# Patient Record
Sex: Female | Born: 1960 | Race: White | Hispanic: No | Marital: Married | Smoking: Never smoker
Health system: Southern US, Community
[De-identification: ages and names within clinical notes are randomized; demographics above are authoritative.]

## PROBLEM LIST (undated history)

## (undated) DIAGNOSIS — K635 Polyp of colon: Secondary | ICD-10-CM

## (undated) DIAGNOSIS — E119 Type 2 diabetes mellitus without complications: Secondary | ICD-10-CM

## (undated) DIAGNOSIS — M1990 Unspecified osteoarthritis, unspecified site: Secondary | ICD-10-CM

## (undated) DIAGNOSIS — IMO0001 Reserved for inherently not codable concepts without codable children: Secondary | ICD-10-CM

## (undated) DIAGNOSIS — R51 Headache: Secondary | ICD-10-CM

## (undated) DIAGNOSIS — C50919 Malignant neoplasm of unspecified site of unspecified female breast: Secondary | ICD-10-CM

## (undated) DIAGNOSIS — C801 Malignant (primary) neoplasm, unspecified: Secondary | ICD-10-CM

## (undated) DIAGNOSIS — K219 Gastro-esophageal reflux disease without esophagitis: Secondary | ICD-10-CM

## (undated) DIAGNOSIS — R519 Headache, unspecified: Secondary | ICD-10-CM

## (undated) HISTORY — PX: FOOT SURGERY: SHX648

## (undated) HISTORY — DX: Malignant neoplasm of unspecified site of unspecified female breast: C50.919

## (undated) HISTORY — DX: Type 2 diabetes mellitus without complications: E11.9

## (undated) HISTORY — DX: Malignant (primary) neoplasm, unspecified: C80.1

## (undated) HISTORY — PX: COLONOSCOPY: SHX174

## (undated) HISTORY — DX: Polyp of colon: K63.5

## (undated) HISTORY — DX: Gastro-esophageal reflux disease without esophagitis: K21.9

## (undated) HISTORY — DX: Unspecified osteoarthritis, unspecified site: M19.90

## (undated) HISTORY — PX: BONE MARROW TRANSPLANT: SHX200

---

## 1993-10-10 HISTORY — PX: CHOLECYSTECTOMY: SHX55

## 1995-10-11 DIAGNOSIS — C50919 Malignant neoplasm of unspecified site of unspecified female breast: Secondary | ICD-10-CM

## 1995-10-11 HISTORY — DX: Malignant neoplasm of unspecified site of unspecified female breast: C50.919

## 2000-01-09 DIAGNOSIS — K621 Rectal polyp: Secondary | ICD-10-CM | POA: Insufficient documentation

## 2000-10-10 DIAGNOSIS — C801 Malignant (primary) neoplasm, unspecified: Secondary | ICD-10-CM

## 2000-10-10 HISTORY — PX: MASTECTOMY: SHX3

## 2000-10-10 HISTORY — PX: OTHER SURGICAL HISTORY: SHX169

## 2000-10-10 HISTORY — DX: Malignant (primary) neoplasm, unspecified: C80.1

## 2004-07-10 ENCOUNTER — Ambulatory Visit: Payer: Self-pay | Admitting: Oncology

## 2004-10-15 ENCOUNTER — Ambulatory Visit: Payer: Self-pay | Admitting: General Surgery

## 2004-11-02 ENCOUNTER — Ambulatory Visit: Payer: Self-pay | Admitting: Oncology

## 2004-11-10 ENCOUNTER — Ambulatory Visit: Payer: Self-pay | Admitting: Oncology

## 2004-11-30 ENCOUNTER — Ambulatory Visit: Payer: Self-pay | Admitting: General Surgery

## 2005-03-02 ENCOUNTER — Ambulatory Visit: Payer: Self-pay | Admitting: Oncology

## 2005-03-10 ENCOUNTER — Ambulatory Visit: Payer: Self-pay | Admitting: Oncology

## 2005-06-30 ENCOUNTER — Ambulatory Visit: Payer: Self-pay | Admitting: Oncology

## 2005-07-08 ENCOUNTER — Ambulatory Visit: Payer: Self-pay | Admitting: Oncology

## 2005-07-10 ENCOUNTER — Ambulatory Visit: Payer: Self-pay | Admitting: Oncology

## 2005-08-10 ENCOUNTER — Ambulatory Visit: Payer: Self-pay | Admitting: Oncology

## 2005-11-10 ENCOUNTER — Ambulatory Visit: Payer: Self-pay | Admitting: Oncology

## 2005-12-08 ENCOUNTER — Ambulatory Visit: Payer: Self-pay | Admitting: Oncology

## 2005-12-14 ENCOUNTER — Ambulatory Visit: Payer: Self-pay | Admitting: General Surgery

## 2006-03-02 ENCOUNTER — Ambulatory Visit: Payer: Self-pay | Admitting: Oncology

## 2006-03-10 ENCOUNTER — Ambulatory Visit: Payer: Self-pay | Admitting: Oncology

## 2006-05-31 ENCOUNTER — Ambulatory Visit: Payer: Self-pay | Admitting: Oncology

## 2006-06-02 ENCOUNTER — Ambulatory Visit: Payer: Self-pay | Admitting: Oncology

## 2006-06-10 ENCOUNTER — Ambulatory Visit: Payer: Self-pay | Admitting: Oncology

## 2006-09-08 ENCOUNTER — Ambulatory Visit: Payer: Self-pay | Admitting: Oncology

## 2006-12-11 ENCOUNTER — Ambulatory Visit: Payer: Self-pay | Admitting: General Surgery

## 2007-01-08 ENCOUNTER — Ambulatory Visit: Payer: Self-pay | Admitting: Oncology

## 2007-01-09 ENCOUNTER — Ambulatory Visit: Payer: Self-pay | Admitting: Oncology

## 2007-04-02 ENCOUNTER — Ambulatory Visit: Payer: Self-pay | Admitting: Oncology

## 2007-04-10 ENCOUNTER — Ambulatory Visit: Payer: Self-pay | Admitting: Oncology

## 2007-06-11 ENCOUNTER — Ambulatory Visit: Payer: Self-pay | Admitting: Oncology

## 2007-07-09 ENCOUNTER — Ambulatory Visit: Payer: Self-pay | Admitting: Oncology

## 2007-07-11 ENCOUNTER — Ambulatory Visit: Payer: Self-pay | Admitting: Oncology

## 2007-09-10 ENCOUNTER — Ambulatory Visit: Payer: Self-pay | Admitting: Oncology

## 2007-10-08 ENCOUNTER — Ambulatory Visit: Payer: Self-pay | Admitting: Oncology

## 2007-10-11 ENCOUNTER — Ambulatory Visit: Payer: Self-pay | Admitting: Oncology

## 2007-11-11 ENCOUNTER — Ambulatory Visit: Payer: Self-pay | Admitting: Oncology

## 2007-12-09 ENCOUNTER — Ambulatory Visit: Payer: Self-pay | Admitting: Oncology

## 2007-12-14 ENCOUNTER — Ambulatory Visit: Payer: Self-pay | Admitting: General Surgery

## 2008-01-09 ENCOUNTER — Ambulatory Visit: Payer: Self-pay | Admitting: Oncology

## 2008-01-11 ENCOUNTER — Ambulatory Visit: Payer: Self-pay | Admitting: Oncology

## 2008-02-08 ENCOUNTER — Ambulatory Visit: Payer: Self-pay | Admitting: Oncology

## 2008-03-10 ENCOUNTER — Ambulatory Visit: Payer: Self-pay | Admitting: Oncology

## 2008-05-10 ENCOUNTER — Ambulatory Visit: Payer: Self-pay | Admitting: Oncology

## 2008-06-04 ENCOUNTER — Ambulatory Visit: Payer: Self-pay | Admitting: Oncology

## 2008-06-10 ENCOUNTER — Ambulatory Visit: Payer: Self-pay | Admitting: Oncology

## 2008-07-03 ENCOUNTER — Ambulatory Visit: Payer: Self-pay | Admitting: Family Medicine

## 2008-07-29 ENCOUNTER — Ambulatory Visit: Payer: Self-pay | Admitting: Oncology

## 2008-08-10 ENCOUNTER — Ambulatory Visit: Payer: Self-pay | Admitting: Oncology

## 2008-12-16 ENCOUNTER — Ambulatory Visit: Payer: Self-pay | Admitting: Oncology

## 2009-01-08 ENCOUNTER — Ambulatory Visit: Payer: Self-pay | Admitting: Oncology

## 2009-01-12 ENCOUNTER — Ambulatory Visit: Payer: Self-pay | Admitting: Oncology

## 2009-02-07 ENCOUNTER — Ambulatory Visit: Payer: Self-pay | Admitting: Oncology

## 2009-03-13 ENCOUNTER — Emergency Department: Payer: Self-pay | Admitting: Emergency Medicine

## 2009-03-27 ENCOUNTER — Ambulatory Visit: Payer: Self-pay | Admitting: Family Medicine

## 2009-05-11 ENCOUNTER — Ambulatory Visit: Payer: Self-pay

## 2009-08-10 ENCOUNTER — Ambulatory Visit: Payer: Self-pay | Admitting: Oncology

## 2009-08-31 ENCOUNTER — Ambulatory Visit: Payer: Self-pay | Admitting: Oncology

## 2009-09-09 ENCOUNTER — Ambulatory Visit: Payer: Self-pay | Admitting: Oncology

## 2009-12-17 ENCOUNTER — Ambulatory Visit: Payer: Self-pay | Admitting: Oncology

## 2010-01-08 ENCOUNTER — Ambulatory Visit: Payer: Self-pay | Admitting: General Surgery

## 2010-02-07 ENCOUNTER — Ambulatory Visit: Payer: Self-pay | Admitting: Oncology

## 2010-03-01 ENCOUNTER — Ambulatory Visit: Payer: Self-pay | Admitting: Oncology

## 2010-03-10 ENCOUNTER — Ambulatory Visit: Payer: Self-pay | Admitting: Oncology

## 2010-08-19 ENCOUNTER — Emergency Department: Payer: Self-pay | Admitting: Emergency Medicine

## 2010-08-31 ENCOUNTER — Ambulatory Visit: Payer: Self-pay | Admitting: Oncology

## 2010-09-01 LAB — CANCER ANTIGEN 27.29: CA 27.29: 27.1 U/mL (ref 0.0–38.6)

## 2010-09-09 ENCOUNTER — Ambulatory Visit: Payer: Self-pay | Admitting: Oncology

## 2010-10-10 ENCOUNTER — Ambulatory Visit: Payer: Self-pay | Admitting: Oncology

## 2010-11-10 ENCOUNTER — Ambulatory Visit: Payer: Self-pay | Admitting: Oncology

## 2010-12-21 ENCOUNTER — Ambulatory Visit: Payer: Self-pay | Admitting: Oncology

## 2010-12-23 ENCOUNTER — Ambulatory Visit: Payer: Self-pay | Admitting: Oncology

## 2010-12-24 LAB — CANCER ANTIGEN 27.29: CA 27.29: 23.5 U/mL (ref 0.0–38.6)

## 2011-01-09 ENCOUNTER — Ambulatory Visit: Payer: Self-pay | Admitting: Oncology

## 2011-10-11 HISTORY — PX: SIGMOIDOSCOPY: SUR1295

## 2011-12-13 ENCOUNTER — Ambulatory Visit: Payer: Self-pay | Admitting: Oncology

## 2011-12-13 LAB — CBC CANCER CENTER
Basophil %: 1.2 %
Eosinophil #: 0.2 x10 3/mm (ref 0.0–0.7)
Eosinophil %: 4.1 %
HCT: 40.7 % (ref 35.0–47.0)
HGB: 13.8 g/dL (ref 12.0–16.0)
Lymphocyte #: 1.6 x10 3/mm (ref 1.0–3.6)
Lymphocyte %: 27.2 %
MCH: 27.6 pg (ref 26.0–34.0)
MCHC: 34 g/dL (ref 32.0–36.0)
MCV: 81 fL (ref 80–100)
Monocyte #: 0.3 x10 3/mm (ref 0.0–0.7)
Neutrophil %: 61.7 %
RBC: 5 10*6/uL (ref 3.80–5.20)

## 2011-12-13 LAB — COMPREHENSIVE METABOLIC PANEL
Albumin: 3.9 g/dL (ref 3.4–5.0)
Alkaline Phosphatase: 94 U/L (ref 50–136)
Anion Gap: 13 (ref 7–16)
BUN: 14 mg/dL (ref 7–18)
Bilirubin,Total: 0.4 mg/dL (ref 0.2–1.0)
Chloride: 103 mmol/L (ref 98–107)
Creatinine: 1.07 mg/dL (ref 0.60–1.30)
EGFR (African American): >60
Glucose: 154 mg/dL — ABNORMAL HIGH (ref 65–99)
SGOT(AST): 12 U/L — ABNORMAL LOW (ref 15–37)
SGPT (ALT): 24 U/L
Total Protein: 7.5 g/dL (ref 6.4–8.2)

## 2011-12-14 LAB — CANCER ANTIGEN 27.29: CA 27.29: 28.3 U/mL (ref 0.0–38.6)

## 2012-01-09 ENCOUNTER — Ambulatory Visit: Payer: Self-pay | Admitting: Oncology

## 2012-02-08 ENCOUNTER — Ambulatory Visit: Payer: Self-pay | Admitting: Oncology

## 2012-07-18 ENCOUNTER — Ambulatory Visit: Payer: Self-pay | Admitting: Oncology

## 2012-07-18 LAB — CBC CANCER CENTER
Basophil #: 0.1 x10 3/mm (ref 0.0–0.1)
Eosinophil #: 0.2 x10 3/mm (ref 0.0–0.7)
Eosinophil %: 3.2 %
HGB: 13.5 g/dL (ref 12.0–16.0)
Lymphocyte #: 1.5 x10 3/mm (ref 1.0–3.6)
MCH: 27.4 pg (ref 26.0–34.0)
MCHC: 33.7 g/dL (ref 32.0–36.0)
MCV: 82 fL (ref 80–100)
Monocyte #: 0.4 x10 3/mm (ref 0.2–0.9)
Neutrophil #: 4.6 x10 3/mm (ref 1.4–6.5)
Platelet: 191 x10 3/mm (ref 150–440)
RBC: 4.94 10*6/uL (ref 3.80–5.20)
RDW: 14.6 % — ABNORMAL HIGH (ref 11.5–14.5)
WBC: 6.8 x10 3/mm (ref 3.6–11.0)

## 2012-07-18 LAB — COMPREHENSIVE METABOLIC PANEL
Albumin: 3.7 g/dL (ref 3.4–5.0)
Alkaline Phosphatase: 97 U/L (ref 50–136)
BUN: 15 mg/dL (ref 7–18)
Bilirubin,Total: 0.3 mg/dL (ref 0.2–1.0)
Co2: 28 mmol/L (ref 21–32)
Creatinine: 0.83 mg/dL (ref 0.60–1.30)
EGFR (African American): >60
EGFR (Non-African Amer.): >60
Glucose: 135 mg/dL — ABNORMAL HIGH (ref 65–99)
SGPT (ALT): 19 U/L (ref 12–78)
Sodium: 144 mmol/L (ref 136–145)
Total Protein: 6.9 g/dL (ref 6.4–8.2)

## 2012-08-10 ENCOUNTER — Ambulatory Visit: Payer: Self-pay | Admitting: Oncology

## 2012-12-12 ENCOUNTER — Encounter: Payer: Self-pay | Admitting: *Deleted

## 2012-12-31 ENCOUNTER — Ambulatory Visit: Payer: Self-pay | Admitting: Oncology

## 2013-01-15 ENCOUNTER — Ambulatory Visit: Payer: Self-pay | Admitting: Oncology

## 2013-01-16 LAB — CBC CANCER CENTER
Basophil #: 0.1 x10 3/mm (ref 0.0–0.1)
Basophil %: 1.3 %
Eosinophil #: 0.2 x10 3/mm (ref 0.0–0.7)
HCT: 41.3 % (ref 35.0–47.0)
HGB: 13.7 g/dL (ref 12.0–16.0)
Lymphocyte #: 1.5 x10 3/mm (ref 1.0–3.6)
Lymphocyte %: 23.9 %
MCV: 81 fL (ref 80–100)
Monocyte #: 0.4 x10 3/mm (ref 0.2–0.9)
Monocyte %: 6.1 %
Neutrophil #: 4.2 x10 3/mm (ref 1.4–6.5)
RBC: 5.09 10*6/uL (ref 3.80–5.20)
RDW: 14.6 % — ABNORMAL HIGH (ref 11.5–14.5)

## 2013-01-16 LAB — COMPREHENSIVE METABOLIC PANEL
Albumin: 3.9 g/dL (ref 3.4–5.0)
BUN: 12 mg/dL (ref 7–18)
Calcium, Total: 9.2 mg/dL (ref 8.5–10.1)
Chloride: 102 mmol/L (ref 98–107)
Co2: 31 mmol/L (ref 21–32)
Creatinine: 1.14 mg/dL (ref 0.60–1.30)
EGFR (African American): >60
EGFR (Non-African Amer.): 55 — ABNORMAL LOW
Osmolality: 288 (ref 275–301)
SGOT(AST): 12 U/L — ABNORMAL LOW (ref 15–37)
SGPT (ALT): 23 U/L (ref 12–78)
Total Protein: 7.4 g/dL (ref 6.4–8.2)

## 2013-01-17 LAB — CANCER ANTIGEN 27.29: CA 27.29: 39.4 U/mL — ABNORMAL HIGH (ref 0.0–38.6)

## 2013-01-30 ENCOUNTER — Ambulatory Visit: Payer: Self-pay | Admitting: General Surgery

## 2013-02-07 ENCOUNTER — Ambulatory Visit: Payer: Self-pay | Admitting: Oncology

## 2013-03-05 ENCOUNTER — Ambulatory Visit: Payer: Self-pay | Admitting: General Surgery

## 2013-03-29 ENCOUNTER — Other Ambulatory Visit: Payer: Self-pay | Admitting: General Surgery

## 2013-04-08 ENCOUNTER — Ambulatory Visit: Payer: Self-pay | Admitting: General Surgery

## 2013-04-24 ENCOUNTER — Encounter: Payer: Self-pay | Admitting: General Surgery

## 2013-04-24 ENCOUNTER — Ambulatory Visit (INDEPENDENT_AMBULATORY_CARE_PROVIDER_SITE_OTHER): Payer: Managed Care, Other (non HMO) | Admitting: General Surgery

## 2013-04-24 VITALS — BP 140/82 | HR 68 | Resp 14 | Ht 65.0 in | Wt 240.0 lb

## 2013-04-24 DIAGNOSIS — K621 Rectal polyp: Secondary | ICD-10-CM

## 2013-04-24 NOTE — Patient Instructions (Addendum)
Colonoscopy A colonoscopy is an exam to evaluate your entire colon. In this exam, your colon is cleansed. A long fiberoptic tube is inserted through your rectum and into your colon. The fiberoptic scope (endoscope) is a long bundle of enclosed and very flexible fibers. These fibers transmit light to the area examined and send images from that area to your caregiver. Discomfort is usually minimal. You may be given a drug to help you sleep (sedative) during or prior to the procedure. This exam helps to detect lumps (tumors), polyps, inflammation, and areas of bleeding. Your caregiver may also take a small piece of tissue (biopsy) that will be examined under a microscope. LET YOUR CAREGIVER KNOW ABOUT:   Allergies to food or medicine.  Medicines taken, including vitamins, herbs, eyedrops, over-the-counter medicines, and creams.  Use of steroids (by mouth or creams).  Previous problems with anesthetics or numbing medicines.  History of bleeding problems or blood clots.  Previous surgery.  Other health problems, including diabetes and kidney problems.  Possibility of pregnancy, if this applies. BEFORE THE PROCEDURE   A clear liquid diet may be required for 2 days before the exam.  Ask your caregiver about changing or stopping your regular medications.  Liquid injections (enemas) or laxatives may be required.  A large amount of electrolyte solution may be given to you to drink over a short period of time. This solution is used to clean out your colon.  You should be present 60 minutes prior to your procedure or as directed by your caregiver. AFTER THE PROCEDURE   If you received a sedative or pain relieving medication, you will need to arrange for someone to drive you home.  Occasionally, there is a little blood passed with the first bowel movement. Do not be concerned. FINDING OUT THE RESULTS OF YOUR TEST Not all test results are available during your visit. If your test results are  not back during the visit, make an appointment with your caregiver to find out the results. Do not assume everything is normal if you have not heard from your caregiver or the medical facility. It is important for you to follow up on all of your test results. HOME CARE INSTRUCTIONS   It is not unusual to pass moderate amounts of gas and experience mild abdominal cramping following the procedure. This is due to air being used to inflate your colon during the exam. Walking or a warm pack on your belly (abdomen) may help.  You may resume all normal meals and activities after sedatives and medicines have worn off.  Only take over-the-counter or prescription medicines for pain, discomfort, or fever as directed by your caregiver. Do not use aspirin or blood thinners if a biopsy was taken. Consult your caregiver for medicine usage if biopsies were taken. SEEK IMMEDIATE MEDICAL CARE IF:   You have a fever.  You pass large blood clots or fill a toilet with blood following the procedure. This may also occur 10 to 14 days following the procedure. This is more likely if a biopsy was taken.  You develop abdominal pain that keeps getting worse and cannot be relieved with medicine. Document Released: 09/23/2000 Document Revised: 12/19/2011 Document Reviewed: 05/08/2008 [facility] Patient Information 2014 [place], [place].  Patient will be contacted to schedule colonoscopy once September 2014 schedule is available. This patient has been asked to hold metformin day of colonoscopy prep and procedure.

## 2013-04-24 NOTE — Progress Notes (Signed)
Patient ID: Paula Price, female   DOB: 11/22/1960, 52 y.o.   MRN: [id number]  Chief Complaint  Patient presents with  . Follow-up    colonoscopy    HPI Paula Price is a 52 y.o. female here today to discuss having a colonoscopy .Patient last colonoscopy was in April 2011. Patient states she has no new medical problems. She does report some stomach discomfort that is unchanged since her last colonoscopy.  HPI  Past Medical History  Diagnosis Date  . Arthritis   . Diabetes mellitus without complication   . Colon polyps   . GERD (gastroesophageal reflux disease)   . Cancer 2002     LEFT MASTECTOMY  . Breast cancer, female 59    Past Surgical History  Procedure Laterality Date  . Mastectomy Left 2002  . Colonoscopy  2011    DR.Ardelle Haliburton  . Foot surgery Right   . Gastric endoscopy   2002  . Bone marrow transplant    . Cholecystectomy  1995  . Sigmoidoscopy  2013    Family History  Problem Relation Age of Onset  . Colon cancer Sister   . COPD Mother     Social History History  Substance Use Topics  . Smoking status: Never Smoker   . Smokeless tobacco: Never Used  . Alcohol Use: No    Allergies  Allergen Reactions  . Codeine Hives  . Latex Itching  . Shellfish Allergy     Current Outpatient Prescriptions  Medication Sig Dispense Refill  . Ascorbic Acid (VITAMIN C) 1000 MG tablet Take 1,000 mg by mouth daily.      . calcium carbonate (OS-CAL - DOSED IN MG OF ELEMENTAL CALCIUM) 1250 MG tablet Take 1 tablet by mouth daily.      Marland Kitchen esomeprazole (NEXIUM) 40 MG packet Take 40 mg by mouth daily before breakfast.      . fexofenadine (ALLEGRA) 180 MG tablet Take 180 mg by mouth daily.      Marland Kitchen letrozole (FEMARA) 2.5 MG tablet Take 2.5 mg by mouth daily.      . meloxicam (MOBIC) 15 MG tablet Take 15 mg by mouth daily.      . metFORMIN (GLUCOPHAGE) 500 MG tablet Take 500 mg by mouth 2 (two) times daily with a meal.      . PARoxetine (PAXIL) 40 MG tablet Take 40  mg by mouth every morning.      . traMADol (ULTRAM) 50 MG tablet Take 50 mg by mouth every 6 (six) hours as needed for pain.      . vitamin E 400 UNIT capsule Take 400 Units by mouth daily.       No current facility-administered medications for this visit.    Review of Systems Review of Systems  Constitutional: Negative.   Respiratory: Negative.   Cardiovascular: Negative.     Blood pressure 140/82, pulse 68, resp. rate 14, height 5\' 5"  (1.651 m), weight 240 lb (108.863 kg).  Physical Exam Physical Exam  Constitutional: She is oriented to person, place, and time. She appears well-developed and well-nourished.  Neck: Neck supple.  Cardiovascular: Normal rate, regular rhythm and normal heart sounds.   Pulmonary/Chest: Effort normal and breath sounds normal.  Abdominal: Soft.  Neurological: She is alert and oriented to person, place, and time.    Data Reviewed 2011 colonoscopy showed a tubular adenoma with focal high-grade dysplasia.  Assessment    Rectal polyp.    Plan    Followup colonoscopy is recommended to  confirm no additional lesions are identified considering the findings of high-grade dysplasia.    Patient will be contacted to schedule colonoscopy once September 2014 schedule is available. This patient has been asked to hold metformin day of colonoscopy prep and procedure.   Earline Mayotte 04/24/2013, 9:42 PM

## 2013-05-13 ENCOUNTER — Telehealth: Payer: Self-pay | Admitting: *Deleted

## 2013-05-13 NOTE — Telephone Encounter (Signed)
Message let for patient to call the office.   We need to arrange a date for colonoscopy at [facility]. This patient will need to hold metformin day of prep and procedure.

## 2013-06-06 ENCOUNTER — Telehealth: Payer: Self-pay | Admitting: *Deleted

## 2013-06-06 DIAGNOSIS — K620 Anal polyp: Secondary | ICD-10-CM

## 2013-06-06 DIAGNOSIS — Z1211 Encounter for screening for malignant neoplasm of colon: Secondary | ICD-10-CM

## 2013-06-06 MED ORDER — POLYETHYLENE GLYCOL 3350 17 GM/SCOOP PO POWD: ORAL | Status: AC

## 2013-06-06 NOTE — Telephone Encounter (Signed)
Patient was called again today since she had not called called back from early August. This patient has been scheduled for a colonoscopy on 07-24-13 at [facility]. Patient has been asked to hold metformin day of colonoscopy prep and procedure. Miralax prescription has been sent to her pharmacy.

## 2013-07-17 ENCOUNTER — Ambulatory Visit: Payer: Self-pay | Admitting: Oncology

## 2013-07-17 ENCOUNTER — Telehealth: Payer: Self-pay | Admitting: *Deleted

## 2013-07-17 LAB — CBC CANCER CENTER
Basophil #: 0.1 x10 3/mm (ref 0.0–0.1)
Basophil %: 1 %
Eosinophil #: 0.2 x10 3/mm (ref 0.0–0.7)
HGB: 13.3 g/dL (ref 12.0–16.0)
MCH: 27.3 pg (ref 26.0–34.0)
MCHC: 33 g/dL (ref 32.0–36.0)
MCV: 83 fL (ref 80–100)
Monocyte #: 0.4 x10 3/mm (ref 0.2–0.9)
Monocyte %: 5.6 %
Neutrophil #: 5.2 x10 3/mm (ref 1.4–6.5)
Neutrophil %: 70.1 %

## 2013-07-17 LAB — COMPREHENSIVE METABOLIC PANEL
Albumin: 3.3 g/dL — ABNORMAL LOW (ref 3.4–5.0)
BUN: 14 mg/dL (ref 7–18)
Bilirubin,Total: 0.3 mg/dL (ref 0.2–1.0)
Co2: 29 mmol/L (ref 21–32)
Creatinine: 1.12 mg/dL (ref 0.60–1.30)
EGFR (African American): >60
EGFR (Non-African Amer.): 56 — ABNORMAL LOW
Glucose: 297 mg/dL — ABNORMAL HIGH (ref 65–99)
Osmolality: 287 (ref 275–301)
Potassium: 4.3 mmol/L (ref 3.5–5.1)
SGOT(AST): 42 U/L — ABNORMAL HIGH (ref 15–37)
SGPT (ALT): 37 U/L (ref 12–78)
Sodium: 138 mmol/L (ref 136–145)
Total Protein: 6.7 g/dL (ref 6.4–8.2)

## 2013-07-17 NOTE — Telephone Encounter (Signed)
Patient was at the office today and reports no change in medications since last office visit. She will pre-register this Friday at [facility]. We will proceed with colonoscopy that is scheduled for 07-24-13. She will call the office if she has further questions.

## 2013-07-18 LAB — CANCER ANTIGEN 27.29: CA 27.29: 366.4 U/mL — ABNORMAL HIGH (ref 0.0–38.6)

## 2013-07-20 ENCOUNTER — Other Ambulatory Visit: Payer: Self-pay | Admitting: General Surgery

## 2013-07-20 DIAGNOSIS — Z1211 Encounter for screening for malignant neoplasm of colon: Secondary | ICD-10-CM

## 2013-07-24 ENCOUNTER — Ambulatory Visit: Payer: Self-pay | Admitting: General Surgery

## 2013-07-24 DIAGNOSIS — D129 Benign neoplasm of anus and anal canal: Secondary | ICD-10-CM

## 2013-07-24 DIAGNOSIS — D128 Benign neoplasm of rectum: Secondary | ICD-10-CM

## 2013-07-25 ENCOUNTER — Encounter: Payer: Self-pay | Admitting: General Surgery

## 2013-07-25 LAB — PATHOLOGY REPORT

## 2013-07-29 ENCOUNTER — Telehealth: Payer: Self-pay | Admitting: General Surgery

## 2013-07-29 NOTE — Telephone Encounter (Signed)
Notified colon biopsies were benign.  Reported prior to endo long standing abdominal pain since prior colonoscopy. No exacerbation after recent exam.  Will arrange office visit to review.

## 2013-07-30 ENCOUNTER — Telehealth: Payer: Self-pay

## 2013-07-30 NOTE — Telephone Encounter (Signed)
Message copied by Sinda Du on Tue Jul 30, 2013  9:27 AM ------      Message from: [place], Merrily Pew      Created: Mon Jul 29, 2013  8:07 AM       Arrange f/u appt in next few weeks to discuss abdominal pain. Thanks. ------

## 2013-07-30 NOTE — Telephone Encounter (Signed)
Left message for patient to call back to set up follow up appointment with J[facility].

## 2013-08-10 ENCOUNTER — Ambulatory Visit: Payer: Self-pay | Admitting: Oncology

## 2013-08-12 ENCOUNTER — Encounter: Payer: Self-pay | Admitting: General Surgery

## 2013-08-12 ENCOUNTER — Ambulatory Visit (INDEPENDENT_AMBULATORY_CARE_PROVIDER_SITE_OTHER): Payer: Managed Care, Other (non HMO) | Admitting: General Surgery

## 2013-08-12 VITALS — BP 130/70 | HR 76 | Resp 12 | Ht 65.0 in | Wt 242.0 lb

## 2013-08-12 DIAGNOSIS — R5383 Other fatigue: Secondary | ICD-10-CM

## 2013-08-12 DIAGNOSIS — R160 Hepatomegaly, not elsewhere classified: Secondary | ICD-10-CM | POA: Insufficient documentation

## 2013-08-12 DIAGNOSIS — R109 Unspecified abdominal pain: Secondary | ICD-10-CM | POA: Insufficient documentation

## 2013-08-12 DIAGNOSIS — R5381 Other malaise: Secondary | ICD-10-CM

## 2013-08-12 DIAGNOSIS — D128 Benign neoplasm of rectum: Secondary | ICD-10-CM | POA: Insufficient documentation

## 2013-08-12 NOTE — Progress Notes (Signed)
Patient ID: Paula Price, female   DOB: Mar 23, 1961, 52 y.o.   MRN: [id number]  Chief Complaint  Patient presents with  . Follow-up    abdominal pain     HPI Paula Price is a 52 y.o. female here today for abdominal pain. She state she has been having pain for about three months. Colonoscopy  was done on 07/24/13. Patient states she has no  Appetite and she has been feeling tried more. Patient states it hurts on both sides. At the time of her office visit prior to her colonoscopy she reported that she been having some abdominal symptoms since her prior exam in 2011. She was pinned down today reporting that in the last 3 month she's had more upper abdominal discomfort. This has not been associated with weight loss. No clearcut dietary intolerance. No change in bowel function.  HPI  Past Medical History  Diagnosis Date  . Arthritis   . Diabetes mellitus without complication   . Colon polyps   . GERD (gastroesophageal reflux disease)   . Cancer 2002     LEFT MASTECTOMY  . Breast cancer, female 66    Past Surgical History  Procedure Laterality Date  . Mastectomy Left 2002  . Colonoscopy  4098,[id number]    DR.Daliyah Sramek  . Foot surgery Right   . Gastric endoscopy   2002  . Bone marrow transplant    . Cholecystectomy  1995  . Sigmoidoscopy  2013    Family History  Problem Relation Age of Onset  . Colon cancer Sister   . COPD Mother     Social History History  Substance Use Topics  . Smoking status: Never Smoker   . Smokeless tobacco: Never Used  . Alcohol Use: No    Allergies  Allergen Reactions  . Codeine Hives  . Latex Itching  . Shellfish Allergy Swelling    Current Outpatient Prescriptions  Medication Sig Dispense Refill  . Ascorbic Acid (VITAMIN C) 1000 MG tablet Take 1,000 mg by mouth daily.      . calcium carbonate (OS-CAL - DOSED IN MG OF ELEMENTAL CALCIUM) 1250 MG tablet Take 1 tablet by mouth daily.      Marland Kitchen esomeprazole (NEXIUM) 40 MG packet Take 40  mg by mouth daily before breakfast.      . fexofenadine (ALLEGRA) 180 MG tablet Take 180 mg by mouth daily.      Marland Kitchen letrozole (FEMARA) 2.5 MG tablet Take 2.5 mg by mouth daily.      . meloxicam (MOBIC) 15 MG tablet Take 15 mg by mouth daily.      . metFORMIN (GLUCOPHAGE) 500 MG tablet Take 500 mg by mouth 2 (two) times daily with a meal.      . PARoxetine (PAXIL) 40 MG tablet Take 40 mg by mouth every morning.      . polyethylene glycol powder (GLYCOLAX/MIRALAX) powder 255 grams one bottle for colonoscopy prep  255 g  0  . traMADol (ULTRAM) 50 MG tablet Take 50 mg by mouth every 6 (six) hours as needed for pain.      . vitamin E 400 UNIT capsule Take 400 Units by mouth daily.       No current facility-administered medications for this visit.    Review of Systems Review of Systems  Constitutional: Positive for appetite change.  Respiratory: Negative.   Cardiovascular: Negative.   Gastrointestinal: Positive for abdominal pain. Negative for nausea, vomiting, diarrhea, constipation, blood in stool, abdominal distention, anal bleeding and rectal  pain.    Blood pressure 130/70, pulse 76, resp. rate 12, height 5\' 5"  (1.651 m), weight 242 lb (109.77 kg).  Physical Exam Physical Exam  Constitutional: She is oriented to person, place, and time. She appears well-developed and well-nourished.  Eyes: No scleral icterus.  Cardiovascular: Normal rate, regular rhythm, normal heart sounds, intact distal pulses and normal pulses.   Pulmonary/Chest: Breath sounds normal.  Abdominal: Soft. Normal appearance and bowel sounds are normal. There is hepatosplenomegaly. There is no hepatomegaly. There is tenderness (upper abdominal . ) in the right upper quadrant. No hernia.    Lymphadenopathy:    She has no cervical adenopathy.  Neurological: She is alert and oriented to person, place, and time.  Skin: Skin is warm and dry.    Data Reviewed Laboratory studies dated October A., 2014 obtained by Dr.  Doylene Canning showed white blood cell count 7400 with normal differential, hemoglobin 13.3, MCV 83, platelet count 164,000. Comprehensive metabolic panel is notable for a mild elevation of the SGOT and depression of the serum albumin 3.3. Blood sugar was significantly elevated at 297. CA 27-29 was 366. The CA 27-29 evaluated April 2014 was minimally elevated at 39.4. July 17, 2013 medical oncology note reviewed. Biopsy results of the descending colon polyp showed an inflammatory lesion. The rectal polyp at 15 cm showed adenomatous changes without evidence of high-grade dysplasia. Review of the 2011 exam shows this is in the same general area as the lesion removed at that time and not evident on sigmoidoscopy in 2012 or 2013.  Assessment    Abdominal pain with CT, hepatomegaly, elevated CA 27-29.     Plan    We'll arrange check thyroid functions as well as a CT of the abdomen and pelvis. All will take place based on the results of that study.     Patient has been scheduled for a CT abdomen/pelvis with contrast at [facility] for 08-16-13 at 10:30 am (arrive 10:15 am). Prep: no solids 4 hours prior but patient may have clear liquids up until exam time, pick up prep kit, and take medication list. Patient verbalizes understanding. She was also instructed to hold metformin day of scan and 48 hours after. Patient to pick up a prescription from the radiology desk since she is allergic to shellfish.    Paula Price 08/12/2013, 9:52 PM

## 2013-08-12 NOTE — Patient Instructions (Signed)
Patient has been scheduled for a CT abdomen/pelvis with contrast at [facility] for 08-16-13 at 10:30 am (arrive 10:15 am). Prep: no solids 4 hours prior but patient may have clear liquids up until exam time, pick up prep kit, and take medication list. Patient verbalizes understanding. She was also instructed to hold metformin day of scan and 48 hours after. Patient to pick up a prescription from the radiology desk since she is allergic to shellfish.

## 2013-08-13 LAB — TSH: TSH: 1.63 u[IU]/mL (ref 0.450–4.500)

## 2013-08-16 ENCOUNTER — Ambulatory Visit: Payer: Self-pay | Admitting: General Surgery

## 2013-08-20 ENCOUNTER — Encounter: Payer: Self-pay | Admitting: General Surgery

## 2013-08-23 ENCOUNTER — Ambulatory Visit: Payer: Self-pay | Admitting: Oncology

## 2013-08-23 LAB — CBC WITH DIFFERENTIAL/PLATELET
Basophil #: 0.1 10*3/uL (ref 0.0–0.1)
Basophil %: 1 %
HGB: 14.6 g/dL (ref 12.0–16.0)
Lymphocyte #: 1.4 10*3/uL (ref 1.0–3.6)
Lymphocyte %: 16.8 %
MCH: 26.8 pg (ref 26.0–34.0)
MCHC: 32.8 g/dL (ref 32.0–36.0)
MCV: 82 fL (ref 80–100)
Monocyte %: 10.6 %
RDW: 15.7 % — ABNORMAL HIGH (ref 11.5–14.5)

## 2013-08-23 LAB — APTT: Activated PTT: 36.2 secs — ABNORMAL HIGH (ref 23.6–35.9)

## 2013-08-23 LAB — PROTIME-INR: INR: 1.1

## 2013-08-28 ENCOUNTER — Telehealth: Payer: Self-pay | Admitting: *Deleted

## 2013-08-28 ENCOUNTER — Other Ambulatory Visit: Payer: Self-pay | Admitting: General Surgery

## 2013-08-28 DIAGNOSIS — C50919 Malignant neoplasm of unspecified site of unspecified female breast: Secondary | ICD-10-CM | POA: Insufficient documentation

## 2013-08-28 NOTE — Telephone Encounter (Signed)
Message left on patient's cell number for her to give the office a call. Also, a message was left on husband's number that we need patient to call the office.  Patient needs to be scheduled for port placement. We need to see if she could do this tomorrow, 08-29-13 with Dr. Lemar Livings. If patient is not amenable, Dr. Evette Cristal could do in his absence next week.

## 2013-08-28 NOTE — Telephone Encounter (Signed)
Patient's surgery has been scheduled for 08-29-13 at [facility]. This patient is aware.  Per Clydie Braun in scheduling, [facility] will not be able to complete a phone interview today as we had originally requested. Patient will need to check in at Pre-admit tomorrow, 08-29-13 at 11:30 am. (Surgery planned for 1:30 pm.) Per Clydie Braun, patient needs to adjust medications as follows: take Nexium tonight and in the morning, no Mobic today and if patient needs anything she can take Tylenol, no Metformin, and if she takes Paxil in the evening she can take this tonight but if she takes in the morning she will not take tomorrow. Message was left on cell number with this information. Husband was contacted on his cell phone and made aware of all instructions since patient has bad reception on her phone. He was instructed to call the office should they have further questions.

## 2013-08-29 ENCOUNTER — Ambulatory Visit: Payer: Self-pay | Admitting: General Surgery

## 2013-08-29 DIAGNOSIS — C50919 Malignant neoplasm of unspecified site of unspecified female breast: Secondary | ICD-10-CM

## 2013-08-29 LAB — PATHOLOGY REPORT

## 2013-08-30 ENCOUNTER — Encounter: Payer: Self-pay | Admitting: General Surgery

## 2013-09-02 ENCOUNTER — Encounter: Payer: Self-pay | Admitting: General Surgery

## 2013-09-03 LAB — CBC CANCER CENTER
Basophil #: 0.1 x10 3/mm (ref 0.0–0.1)
Basophil %: 1.4 %
Eosinophil #: 0.1 x10 3/mm (ref 0.0–0.7)
HCT: 46.4 % (ref 35.0–47.0)
Lymphocyte #: 1.8 x10 3/mm (ref 1.0–3.6)
Lymphocyte %: 17.9 %
MCV: 83 fL (ref 80–100)
Monocyte #: 1 x10 3/mm — ABNORMAL HIGH (ref 0.2–0.9)
Monocyte %: 9.6 %
Neutrophil #: 7.1 x10 3/mm — ABNORMAL HIGH (ref 1.4–6.5)
Neutrophil %: 69.7 %
Platelet: 173 x10 3/mm (ref 150–440)
RBC: 5.62 10*6/uL — ABNORMAL HIGH (ref 3.80–5.20)
RDW: 16.8 % — ABNORMAL HIGH (ref 11.5–14.5)
WBC: 10.2 x10 3/mm (ref 3.6–11.0)

## 2013-09-03 LAB — COMPREHENSIVE METABOLIC PANEL
Albumin: 2.5 g/dL — ABNORMAL LOW (ref 3.4–5.0)
Alkaline Phosphatase: 240 U/L — ABNORMAL HIGH
Calcium, Total: 9.6 mg/dL (ref 8.5–10.1)
Chloride: 102 mmol/L (ref 98–107)
Co2: 28 mmol/L (ref 21–32)
EGFR (African American): >60
Osmolality: 280 (ref 275–301)
Potassium: 3.8 mmol/L (ref 3.5–5.1)
SGPT (ALT): 50 U/L (ref 12–78)
Total Protein: 6.6 g/dL (ref 6.4–8.2)

## 2013-09-09 ENCOUNTER — Ambulatory Visit: Payer: Self-pay | Admitting: Oncology

## 2013-09-10 LAB — COMPREHENSIVE METABOLIC PANEL
Bilirubin,Total: 1.2 mg/dL — ABNORMAL HIGH (ref 0.2–1.0)
Calcium, Total: 8.4 mg/dL — ABNORMAL LOW (ref 8.5–10.1)
Chloride: 99 mmol/L (ref 98–107)
EGFR (African American): >60
EGFR (Non-African Amer.): >60
Glucose: 137 mg/dL — ABNORMAL HIGH (ref 65–99)
Osmolality: 271 (ref 275–301)
Sodium: 135 mmol/L — ABNORMAL LOW (ref 136–145)
Total Protein: 5.8 g/dL — ABNORMAL LOW (ref 6.4–8.2)

## 2013-09-10 LAB — CBC CANCER CENTER
Basophil #: 0 x10 3/mm (ref 0.0–0.1)
Basophil %: 0.4 %
Eosinophil #: 0.1 x10 3/mm (ref 0.0–0.7)
HGB: 13.3 g/dL (ref 12.0–16.0)
Lymphocyte #: 0.9 x10 3/mm — ABNORMAL LOW (ref 1.0–3.6)
MCH: 27.1 pg (ref 26.0–34.0)
Monocyte #: 0.2 x10 3/mm (ref 0.2–0.9)
Neutrophil #: 0.6 x10 3/mm — ABNORMAL LOW (ref 1.4–6.5)
RBC: 4.92 10*6/uL (ref 3.80–5.20)
RDW: 16.4 % — ABNORMAL HIGH (ref 11.5–14.5)
WBC: 1.8 x10 3/mm — CL (ref 3.6–11.0)

## 2013-09-17 LAB — CBC CANCER CENTER
Basophil #: 0.1 x10 3/mm (ref 0.0–0.1)
Eosinophil #: 0 x10 3/mm (ref 0.0–0.7)
Eosinophil %: 0.9 %
HGB: 12.4 g/dL (ref 12.0–16.0)
Lymphocyte #: 1.2 x10 3/mm (ref 1.0–3.6)
Lymphocyte %: 32.7 %
MCH: 26.2 pg (ref 26.0–34.0)
MCV: 83 fL (ref 80–100)
Monocyte #: 0.9 x10 3/mm (ref 0.2–0.9)
Monocyte %: 25.6 %
Neutrophil #: 1.3 x10 3/mm — ABNORMAL LOW (ref 1.4–6.5)
Platelet: 141 x10 3/mm — ABNORMAL LOW (ref 150–440)
RBC: 4.75 10*6/uL (ref 3.80–5.20)
RDW: 16.8 % — ABNORMAL HIGH (ref 11.5–14.5)

## 2013-09-24 LAB — CBC CANCER CENTER
Basophil #: 0.1 x10 3/mm (ref 0.0–0.1)
Basophil %: 5.9 %
Eosinophil #: 0 x10 3/mm (ref 0.0–0.7)
HGB: 11.9 g/dL — ABNORMAL LOW (ref 12.0–16.0)
Lymphocyte #: 0.8 x10 3/mm — ABNORMAL LOW (ref 1.0–3.6)
MCH: 25.8 pg — ABNORMAL LOW (ref 26.0–34.0)
Monocyte #: 0.1 x10 3/mm — ABNORMAL LOW (ref 0.2–0.9)
Platelet: 85 x10 3/mm — ABNORMAL LOW (ref 150–440)

## 2013-09-24 LAB — COMPREHENSIVE METABOLIC PANEL
Albumin: 2.4 g/dL — ABNORMAL LOW (ref 3.4–5.0)
Anion Gap: 7 (ref 7–16)
Bilirubin,Total: 0.9 mg/dL (ref 0.2–1.0)
Chloride: 103 mmol/L (ref 98–107)
Co2: 31 mmol/L (ref 21–32)
EGFR (Non-African Amer.): >60
Glucose: 152 mg/dL — ABNORMAL HIGH (ref 65–99)
Osmolality: 282 (ref 275–301)
Potassium: 3.3 mmol/L — ABNORMAL LOW (ref 3.5–5.1)
SGPT (ALT): 28 U/L (ref 12–78)
Sodium: 141 mmol/L (ref 136–145)

## 2013-10-01 LAB — CBC CANCER CENTER
Basophil %: 3.8 %
Eosinophil #: 0.1 x10 3/mm (ref 0.0–0.7)
Eosinophil %: 4.2 %
Lymphocyte #: 1.1 x10 3/mm (ref 1.0–3.6)
Lymphocyte %: 49.5 %
MCH: 26.1 pg (ref 26.0–34.0)
MCHC: 32.3 g/dL (ref 32.0–36.0)
Neutrophil #: 0.3 x10 3/mm — ABNORMAL LOW (ref 1.4–6.5)
Neutrophil %: 11.2 %
RBC: 4.51 10*6/uL (ref 3.80–5.20)
RDW: 16.8 % — ABNORMAL HIGH (ref 11.5–14.5)
WBC: 2.3 x10 3/mm — ABNORMAL LOW (ref 3.6–11.0)

## 2013-10-10 ENCOUNTER — Ambulatory Visit: Payer: Self-pay | Admitting: Oncology

## 2013-10-15 LAB — COMPREHENSIVE METABOLIC PANEL
ALT: 16 U/L (ref 12–78)
Albumin: 2.7 g/dL — ABNORMAL LOW (ref 3.4–5.0)
Alkaline Phosphatase: 96 U/L
Anion Gap: 9 (ref 7–16)
BILIRUBIN TOTAL: 1.2 mg/dL — AB (ref 0.2–1.0)
BUN: 3 mg/dL — ABNORMAL LOW (ref 7–18)
CALCIUM: 9.3 mg/dL (ref 8.5–10.1)
CREATININE: 0.81 mg/dL (ref 0.60–1.30)
Chloride: 102 mmol/L (ref 98–107)
Co2: 29 mmol/L (ref 21–32)
EGFR (African American): >60
Glucose: 112 mg/dL — ABNORMAL HIGH (ref 65–99)
OSMOLALITY: 277 (ref 275–301)
Potassium: 3.3 mmol/L — ABNORMAL LOW (ref 3.5–5.1)
SGOT(AST): 34 U/L (ref 15–37)
Sodium: 140 mmol/L (ref 136–145)
TOTAL PROTEIN: 5.8 g/dL — AB (ref 6.4–8.2)

## 2013-10-15 LAB — CBC CANCER CENTER
BASOS ABS: 0.1 x10 3/mm (ref 0.0–0.1)
BASOS PCT: 0.9 %
EOS PCT: 3.1 %
Eosinophil #: 0.2 x10 3/mm (ref 0.0–0.7)
HCT: 39.1 % (ref 35.0–47.0)
HGB: 12.2 g/dL (ref 12.0–16.0)
LYMPHS ABS: 1.3 x10 3/mm (ref 1.0–3.6)
Lymphocyte %: 16.7 %
MCH: 25.8 pg — ABNORMAL LOW (ref 26.0–34.0)
MCHC: 31.4 g/dL — ABNORMAL LOW (ref 32.0–36.0)
MCV: 82 fL (ref 80–100)
Monocyte #: 0.5 x10 3/mm (ref 0.2–0.9)
Monocyte %: 6 %
NEUTROS ABS: 5.8 x10 3/mm (ref 1.4–6.5)
NEUTROS PCT: 73.3 %
PLATELETS: 100 x10 3/mm — AB (ref 150–440)
RBC: 4.75 10*6/uL (ref 3.80–5.20)
RDW: 18.3 % — AB (ref 11.5–14.5)
WBC: 7.9 x10 3/mm (ref 3.6–11.0)

## 2013-10-22 LAB — COMPREHENSIVE METABOLIC PANEL
ALK PHOS: 84 U/L
ALT: 14 U/L (ref 12–78)
AST: 25 U/L (ref 15–37)
Albumin: 2.8 g/dL — ABNORMAL LOW (ref 3.4–5.0)
Anion Gap: 6 — ABNORMAL LOW (ref 7–16)
BUN: 5 mg/dL — ABNORMAL LOW (ref 7–18)
Bilirubin,Total: 1.1 mg/dL — ABNORMAL HIGH (ref 0.2–1.0)
CREATININE: 0.82 mg/dL (ref 0.60–1.30)
Calcium, Total: 9.1 mg/dL (ref 8.5–10.1)
Chloride: 102 mmol/L (ref 98–107)
Co2: 30 mmol/L (ref 21–32)
EGFR (African American): >60
Glucose: 106 mg/dL — ABNORMAL HIGH (ref 65–99)
Osmolality: 273 (ref 275–301)
POTASSIUM: 3.2 mmol/L — AB (ref 3.5–5.1)
Sodium: 138 mmol/L (ref 136–145)
Total Protein: 5.6 g/dL — ABNORMAL LOW (ref 6.4–8.2)

## 2013-10-22 LAB — CBC CANCER CENTER
Bands: 10 %
Basophil: 3 %
Eosinophil: 10 %
HCT: 32.6 % — AB (ref 35.0–47.0)
HGB: 10.5 g/dL — ABNORMAL LOW (ref 12.0–16.0)
LYMPHS PCT: 36 %
MCH: 26.2 pg (ref 26.0–34.0)
MCHC: 32.1 g/dL (ref 32.0–36.0)
MCV: 82 fL (ref 80–100)
MONOS PCT: 5 %
MYELOCYTE: 1 %
NRBC/100 WBC: 2 /100
Other Cells Blood: 10 %
PLATELETS: 61 x10 3/mm — AB (ref 150–440)
PROMYELOCYTE: 1 %
RBC: 3.99 10*6/uL (ref 3.80–5.20)
RDW: 19.8 % — ABNORMAL HIGH (ref 11.5–14.5)
SEGMENTED NEUTROPHILS: 21 %
Variant Lymphocyte: 3 %
WBC: 3.8 x10 3/mm (ref 3.6–11.0)

## 2013-10-23 LAB — CANCER ANTIGEN 27.29: CA 27.29: 243.2 U/mL — ABNORMAL HIGH (ref 0.0–38.6)

## 2013-10-29 LAB — CBC CANCER CENTER
Basophil #: 0.1 x10 3/mm (ref 0.0–0.1)
Basophil %: 0.6 %
EOS PCT: 0.2 %
Eosinophil #: 0 x10 3/mm (ref 0.0–0.7)
HCT: 32.3 % — ABNORMAL LOW (ref 35.0–47.0)
HGB: 10.4 g/dL — ABNORMAL LOW (ref 12.0–16.0)
Lymphocyte #: 1.3 x10 3/mm (ref 1.0–3.6)
Lymphocyte %: 12.6 %
MCH: 26.6 pg (ref 26.0–34.0)
MCHC: 32 g/dL (ref 32.0–36.0)
MCV: 83 fL (ref 80–100)
Monocyte #: 0.6 x10 3/mm (ref 0.2–0.9)
Monocyte %: 5.6 %
Neutrophil #: 8.2 x10 3/mm — ABNORMAL HIGH (ref 1.4–6.5)
Neutrophil %: 81 %
PLATELETS: 84 x10 3/mm — AB (ref 150–440)
RBC: 3.9 10*6/uL (ref 3.80–5.20)
RDW: 20.6 % — ABNORMAL HIGH (ref 11.5–14.5)
WBC: 10.2 x10 3/mm (ref 3.6–11.0)

## 2013-10-29 LAB — COMPREHENSIVE METABOLIC PANEL
ALBUMIN: 2.7 g/dL — AB (ref 3.4–5.0)
AST: 24 U/L (ref 15–37)
Alkaline Phosphatase: 90 U/L
Anion Gap: 8 (ref 7–16)
BILIRUBIN TOTAL: 0.6 mg/dL (ref 0.2–1.0)
BUN: 7 mg/dL (ref 7–18)
CALCIUM: 7.7 mg/dL — AB (ref 8.5–10.1)
Chloride: 103 mmol/L (ref 98–107)
Co2: 28 mmol/L (ref 21–32)
Creatinine: 0.82 mg/dL (ref 0.60–1.30)
EGFR (Non-African Amer.): >60
GLUCOSE: 150 mg/dL — AB (ref 65–99)
OSMOLALITY: 278 (ref 275–301)
Potassium: 3.4 mmol/L — ABNORMAL LOW (ref 3.5–5.1)
SGPT (ALT): 12 U/L (ref 12–78)
Sodium: 139 mmol/L (ref 136–145)
Total Protein: 5.7 g/dL — ABNORMAL LOW (ref 6.4–8.2)

## 2013-11-10 ENCOUNTER — Ambulatory Visit: Payer: Self-pay | Admitting: Oncology

## 2013-11-12 LAB — COMPREHENSIVE METABOLIC PANEL
ALK PHOS: 123 U/L — AB
ALT: 18 U/L (ref 12–78)
Albumin: 2.9 g/dL — ABNORMAL LOW (ref 3.4–5.0)
Anion Gap: 8 (ref 7–16)
BUN: 8 mg/dL (ref 7–18)
Bilirubin,Total: 0.6 mg/dL (ref 0.2–1.0)
CALCIUM: 8.1 mg/dL — AB (ref 8.5–10.1)
CHLORIDE: 103 mmol/L (ref 98–107)
CREATININE: 0.71 mg/dL (ref 0.60–1.30)
Co2: 30 mmol/L (ref 21–32)
EGFR (Non-African Amer.): >60
Glucose: 117 mg/dL — ABNORMAL HIGH (ref 65–99)
Osmolality: 281 (ref 275–301)
Potassium: 3.5 mmol/L (ref 3.5–5.1)
SGOT(AST): 22 U/L (ref 15–37)
SODIUM: 141 mmol/L (ref 136–145)
Total Protein: 5.9 g/dL — ABNORMAL LOW (ref 6.4–8.2)

## 2013-11-12 LAB — CBC CANCER CENTER
BASOS PCT: 1.2 %
Basophil #: 0.1 x10 3/mm (ref 0.0–0.1)
EOS PCT: 0.7 %
Eosinophil #: 0.1 x10 3/mm (ref 0.0–0.7)
HCT: 32 % — ABNORMAL LOW (ref 35.0–47.0)
HGB: 10.4 g/dL — ABNORMAL LOW (ref 12.0–16.0)
LYMPHS PCT: 14.7 %
Lymphocyte #: 1.3 x10 3/mm (ref 1.0–3.6)
MCH: 27.1 pg (ref 26.0–34.0)
MCHC: 32.3 g/dL (ref 32.0–36.0)
MCV: 84 fL (ref 80–100)
MONOS PCT: 5.9 %
Monocyte #: 0.5 x10 3/mm (ref 0.2–0.9)
NEUTROS PCT: 77.5 %
Neutrophil #: 6.9 x10 3/mm — ABNORMAL HIGH (ref 1.4–6.5)
Platelet: 66 x10 3/mm — ABNORMAL LOW (ref 150–440)
RBC: 3.82 10*6/uL (ref 3.80–5.20)
RDW: 23.8 % — ABNORMAL HIGH (ref 11.5–14.5)
WBC: 9 x10 3/mm (ref 3.6–11.0)

## 2013-11-19 LAB — CBC CANCER CENTER
Basophil #: 0.1 x10 3/mm (ref 0.0–0.1)
Basophil %: 1.4 %
EOS ABS: 0.1 x10 3/mm (ref 0.0–0.7)
Eosinophil %: 1.6 %
HCT: 33.6 % — ABNORMAL LOW (ref 35.0–47.0)
HGB: 10.7 g/dL — ABNORMAL LOW (ref 12.0–16.0)
LYMPHS ABS: 1 x10 3/mm (ref 1.0–3.6)
Lymphocyte %: 17 %
MCH: 27.2 pg (ref 26.0–34.0)
MCHC: 31.8 g/dL — AB (ref 32.0–36.0)
MCV: 86 fL (ref 80–100)
MONO ABS: 0.6 x10 3/mm (ref 0.2–0.9)
Monocyte %: 10.2 %
NEUTROS ABS: 3.9 x10 3/mm (ref 1.4–6.5)
Neutrophil %: 69.8 %
PLATELETS: 92 x10 3/mm — AB (ref 150–440)
RBC: 3.93 10*6/uL (ref 3.80–5.20)
RDW: 24.1 % — ABNORMAL HIGH (ref 11.5–14.5)
WBC: 5.6 x10 3/mm (ref 3.6–11.0)

## 2013-11-19 LAB — COMPREHENSIVE METABOLIC PANEL
ALBUMIN: 2.9 g/dL — AB (ref 3.4–5.0)
ANION GAP: 7 (ref 7–16)
AST: 20 U/L (ref 15–37)
Alkaline Phosphatase: 105 U/L
BILIRUBIN TOTAL: 0.7 mg/dL (ref 0.2–1.0)
BUN: 7 mg/dL (ref 7–18)
CHLORIDE: 104 mmol/L (ref 98–107)
CREATININE: 0.69 mg/dL (ref 0.60–1.30)
Calcium, Total: 8.2 mg/dL — ABNORMAL LOW (ref 8.5–10.1)
Co2: 31 mmol/L (ref 21–32)
EGFR (African American): >60
EGFR (Non-African Amer.): >60
Glucose: 106 mg/dL — ABNORMAL HIGH (ref 65–99)
OSMOLALITY: 282 (ref 275–301)
POTASSIUM: 3.4 mmol/L — AB (ref 3.5–5.1)
SGPT (ALT): 16 U/L (ref 12–78)
SODIUM: 142 mmol/L (ref 136–145)
TOTAL PROTEIN: 5.8 g/dL — AB (ref 6.4–8.2)

## 2013-12-03 LAB — CBC CANCER CENTER
BASOS ABS: 0.1 x10 3/mm (ref 0.0–0.1)
BASOS PCT: 1.4 %
EOS ABS: 0.1 x10 3/mm (ref 0.0–0.7)
EOS PCT: 2 %
HCT: 34.4 % — ABNORMAL LOW (ref 35.0–47.0)
HGB: 11 g/dL — ABNORMAL LOW (ref 12.0–16.0)
LYMPHS ABS: 1.3 x10 3/mm (ref 1.0–3.6)
Lymphocyte %: 17 %
MCH: 27.7 pg (ref 26.0–34.0)
MCHC: 32 g/dL (ref 32.0–36.0)
MCV: 86 fL (ref 80–100)
Monocyte #: 0.4 x10 3/mm (ref 0.2–0.9)
Monocyte %: 5.6 %
NEUTROS PCT: 74 %
Neutrophil #: 5.6 x10 3/mm (ref 1.4–6.5)
Platelet: 63 x10 3/mm — ABNORMAL LOW (ref 150–440)
RBC: 3.99 10*6/uL (ref 3.80–5.20)
RDW: 23.1 % — ABNORMAL HIGH (ref 11.5–14.5)
WBC: 7.5 x10 3/mm (ref 3.6–11.0)

## 2013-12-03 LAB — COMPREHENSIVE METABOLIC PANEL
ALBUMIN: 2.7 g/dL — AB (ref 3.4–5.0)
ALK PHOS: 100 U/L
Anion Gap: 8 (ref 7–16)
BUN: 5 mg/dL — ABNORMAL LOW (ref 7–18)
Bilirubin,Total: 0.5 mg/dL (ref 0.2–1.0)
CHLORIDE: 105 mmol/L (ref 98–107)
CREATININE: 0.73 mg/dL (ref 0.60–1.30)
Calcium, Total: 8.2 mg/dL — ABNORMAL LOW (ref 8.5–10.1)
Co2: 29 mmol/L (ref 21–32)
EGFR (African American): >60
GLUCOSE: 101 mg/dL — AB (ref 65–99)
OSMOLALITY: 281 (ref 275–301)
POTASSIUM: 3.5 mmol/L (ref 3.5–5.1)
SGOT(AST): 29 U/L (ref 15–37)
SGPT (ALT): 22 U/L (ref 12–78)
Sodium: 142 mmol/L (ref 136–145)
TOTAL PROTEIN: 5.4 g/dL — AB (ref 6.4–8.2)

## 2013-12-06 LAB — CANCER ANTIGEN 27.29: CA 27.29: 121.5 U/mL — ABNORMAL HIGH (ref 0.0–38.6)

## 2013-12-08 ENCOUNTER — Ambulatory Visit: Payer: Self-pay | Admitting: Oncology

## 2013-12-24 LAB — COMPREHENSIVE METABOLIC PANEL
ALT: 20 U/L (ref 12–78)
Albumin: 2.8 g/dL — ABNORMAL LOW (ref 3.4–5.0)
Alkaline Phosphatase: 91 U/L
Anion Gap: 1 — ABNORMAL LOW (ref 7–16)
BILIRUBIN TOTAL: 0.8 mg/dL (ref 0.2–1.0)
BUN: 7 mg/dL (ref 7–18)
Calcium, Total: 8.3 mg/dL — ABNORMAL LOW (ref 8.5–10.1)
Chloride: 107 mmol/L (ref 98–107)
Co2: 33 mmol/L — ABNORMAL HIGH (ref 21–32)
Creatinine: 0.71 mg/dL (ref 0.60–1.30)
EGFR (African American): >60
GLUCOSE: 107 mg/dL — AB (ref 65–99)
Osmolality: 280 (ref 275–301)
Potassium: 3.5 mmol/L (ref 3.5–5.1)
SGOT(AST): 32 U/L (ref 15–37)
Sodium: 141 mmol/L (ref 136–145)
TOTAL PROTEIN: 5.6 g/dL — AB (ref 6.4–8.2)

## 2013-12-24 LAB — CBC CANCER CENTER
BASOS PCT: 0.6 %
Basophil #: 0 x10 3/mm (ref 0.0–0.1)
Eosinophil #: 0.1 x10 3/mm (ref 0.0–0.7)
Eosinophil %: 1 %
HCT: 33 % — AB (ref 35.0–47.0)
HGB: 10.7 g/dL — ABNORMAL LOW (ref 12.0–16.0)
Lymphocyte #: 0.8 x10 3/mm — ABNORMAL LOW (ref 1.0–3.6)
Lymphocyte %: 12.5 %
MCH: 29 pg (ref 26.0–34.0)
MCHC: 32.5 g/dL (ref 32.0–36.0)
MCV: 89 fL (ref 80–100)
Monocyte #: 0.6 x10 3/mm (ref 0.2–0.9)
Monocyte %: 10.1 %
NEUTROS ABS: 4.8 x10 3/mm (ref 1.4–6.5)
Neutrophil %: 75.8 %
Platelet: 69 x10 3/mm — ABNORMAL LOW (ref 150–440)
RBC: 3.7 10*6/uL — AB (ref 3.80–5.20)
RDW: 22.7 % — ABNORMAL HIGH (ref 11.5–14.5)
WBC: 6.4 x10 3/mm (ref 3.6–11.0)

## 2014-01-07 LAB — COMPREHENSIVE METABOLIC PANEL
AST: 28 U/L (ref 15–37)
Albumin: 2.7 g/dL — ABNORMAL LOW (ref 3.4–5.0)
Alkaline Phosphatase: 107 U/L
Anion Gap: 7 (ref 7–16)
BILIRUBIN TOTAL: 0.7 mg/dL (ref 0.2–1.0)
BUN: 4 mg/dL — AB (ref 7–18)
CALCIUM: 9.2 mg/dL (ref 8.5–10.1)
Chloride: 102 mmol/L (ref 98–107)
Co2: 32 mmol/L (ref 21–32)
Creatinine: 0.83 mg/dL (ref 0.60–1.30)
EGFR (Non-African Amer.): >60
GLUCOSE: 118 mg/dL — AB (ref 65–99)
Osmolality: 279 (ref 275–301)
Potassium: 3 mmol/L — ABNORMAL LOW (ref 3.5–5.1)
SGPT (ALT): 14 U/L (ref 12–78)
Sodium: 141 mmol/L (ref 136–145)
Total Protein: 5.4 g/dL — ABNORMAL LOW (ref 6.4–8.2)

## 2014-01-07 LAB — CBC CANCER CENTER
BASOS ABS: 0.2 x10 3/mm — AB (ref 0.0–0.1)
BASOS PCT: 1.8 %
Eosinophil #: 0.1 x10 3/mm (ref 0.0–0.7)
Eosinophil %: 0.6 %
HCT: 34.4 % — ABNORMAL LOW (ref 35.0–47.0)
HGB: 10.9 g/dL — ABNORMAL LOW (ref 12.0–16.0)
LYMPHS ABS: 1.2 x10 3/mm (ref 1.0–3.6)
LYMPHS PCT: 11.5 %
MCH: 28.3 pg (ref 26.0–34.0)
MCHC: 31.7 g/dL — ABNORMAL LOW (ref 32.0–36.0)
MCV: 89 fL (ref 80–100)
MONOS PCT: 5.4 %
Monocyte #: 0.5 x10 3/mm (ref 0.2–0.9)
Neutrophil #: 8.1 x10 3/mm — ABNORMAL HIGH (ref 1.4–6.5)
Neutrophil %: 80.7 %
PLATELETS: 67 x10 3/mm — AB (ref 150–440)
RBC: 3.85 10*6/uL (ref 3.80–5.20)
RDW: 20.9 % — ABNORMAL HIGH (ref 11.5–14.5)
WBC: 10 x10 3/mm (ref 3.6–11.0)

## 2014-01-08 ENCOUNTER — Ambulatory Visit: Payer: Self-pay | Admitting: Oncology

## 2014-01-08 LAB — CANCER ANTIGEN 27.29: CA 27.29: 120.1 U/mL — ABNORMAL HIGH (ref 0.0–38.6)

## 2014-01-21 LAB — CBC CANCER CENTER
BASOS PCT: 0.5 %
Basophil #: 0.1 x10 3/mm (ref 0.0–0.1)
EOS PCT: 0.9 %
Eosinophil #: 0.1 x10 3/mm (ref 0.0–0.7)
HCT: 31 % — ABNORMAL LOW (ref 35.0–47.0)
HGB: 9.7 g/dL — ABNORMAL LOW (ref 12.0–16.0)
LYMPHS ABS: 1.2 x10 3/mm (ref 1.0–3.6)
Lymphocyte %: 10.7 %
MCH: 27.5 pg (ref 26.0–34.0)
MCHC: 31.1 g/dL — ABNORMAL LOW (ref 32.0–36.0)
MCV: 88 fL (ref 80–100)
MONO ABS: 0.8 x10 3/mm (ref 0.2–0.9)
Monocyte %: 7.5 %
Neutrophil #: 9.1 x10 3/mm — ABNORMAL HIGH (ref 1.4–6.5)
Neutrophil %: 80.4 %
Platelet: 67 x10 3/mm — ABNORMAL LOW (ref 150–440)
RBC: 3.52 10*6/uL — ABNORMAL LOW (ref 3.80–5.20)
RDW: 19.5 % — ABNORMAL HIGH (ref 11.5–14.5)
WBC: 11.3 x10 3/mm — AB (ref 3.6–11.0)

## 2014-01-21 LAB — COMPREHENSIVE METABOLIC PANEL
Albumin: 2.8 g/dL — ABNORMAL LOW (ref 3.4–5.0)
Alkaline Phosphatase: 94 U/L
Anion Gap: 7 (ref 7–16)
BUN: 6 mg/dL — AB (ref 7–18)
Bilirubin,Total: 0.7 mg/dL (ref 0.2–1.0)
CO2: 31 mmol/L (ref 21–32)
Calcium, Total: 8.3 mg/dL — ABNORMAL LOW (ref 8.5–10.1)
Chloride: 104 mmol/L (ref 98–107)
Creatinine: 0.91 mg/dL (ref 0.60–1.30)
EGFR (African American): >60
EGFR (Non-African Amer.): >60
GLUCOSE: 103 mg/dL — AB (ref 65–99)
Osmolality: 281 (ref 275–301)
Potassium: 3.2 mmol/L — ABNORMAL LOW (ref 3.5–5.1)
SGOT(AST): 28 U/L (ref 15–37)
SGPT (ALT): 11 U/L — ABNORMAL LOW (ref 12–78)
SODIUM: 142 mmol/L (ref 136–145)
Total Protein: 5.6 g/dL — ABNORMAL LOW (ref 6.4–8.2)

## 2014-02-04 LAB — CBC CANCER CENTER
BASOS PCT: 0.8 %
Basophil #: 0.1 x10 3/mm (ref 0.0–0.1)
Eosinophil #: 0.1 x10 3/mm (ref 0.0–0.7)
Eosinophil %: 0.7 %
HCT: 34 % — AB (ref 35.0–47.0)
HGB: 11 g/dL — ABNORMAL LOW (ref 12.0–16.0)
Lymphocyte #: 1.4 x10 3/mm (ref 1.0–3.6)
Lymphocyte %: 15.2 %
MCH: 27.7 pg (ref 26.0–34.0)
MCHC: 32.4 g/dL (ref 32.0–36.0)
MCV: 85 fL (ref 80–100)
Monocyte #: 0.6 x10 3/mm (ref 0.2–0.9)
Monocyte %: 6.5 %
NEUTROS PCT: 76.8 %
Neutrophil #: 7.3 x10 3/mm — ABNORMAL HIGH (ref 1.4–6.5)
Platelet: 86 x10 3/mm — ABNORMAL LOW (ref 150–440)
RBC: 3.99 10*6/uL (ref 3.80–5.20)
RDW: 19.8 % — AB (ref 11.5–14.5)
WBC: 9.5 x10 3/mm (ref 3.6–11.0)

## 2014-02-04 LAB — COMPREHENSIVE METABOLIC PANEL
Albumin: 2.8 g/dL — ABNORMAL LOW (ref 3.4–5.0)
Alkaline Phosphatase: 109 U/L
Anion Gap: 9 (ref 7–16)
BUN: 5 mg/dL — ABNORMAL LOW (ref 7–18)
Bilirubin,Total: 0.9 mg/dL (ref 0.2–1.0)
CHLORIDE: 102 mmol/L (ref 98–107)
Calcium, Total: 8.2 mg/dL — ABNORMAL LOW (ref 8.5–10.1)
Co2: 30 mmol/L (ref 21–32)
Creatinine: 0.9 mg/dL (ref 0.60–1.30)
EGFR (African American): >60
EGFR (Non-African Amer.): >60
Glucose: 115 mg/dL — ABNORMAL HIGH (ref 65–99)
Osmolality: 279 (ref 275–301)
Potassium: 3.2 mmol/L — ABNORMAL LOW (ref 3.5–5.1)
SGOT(AST): 24 U/L (ref 15–37)
SGPT (ALT): 10 U/L — ABNORMAL LOW (ref 12–78)
SODIUM: 141 mmol/L (ref 136–145)
Total Protein: 5.5 g/dL — ABNORMAL LOW (ref 6.4–8.2)

## 2014-02-05 LAB — CANCER ANTIGEN 27.29: CA 27.29: 92.5 U/mL — ABNORMAL HIGH (ref 0.0–38.6)

## 2014-02-07 ENCOUNTER — Ambulatory Visit: Payer: Self-pay | Admitting: Oncology

## 2014-02-19 LAB — CBC CANCER CENTER
BASOS ABS: 0.1 x10 3/mm (ref 0.0–0.1)
Basophil %: 0.6 %
EOS ABS: 0.1 x10 3/mm (ref 0.0–0.7)
Eosinophil %: 0.8 %
HCT: 32.3 % — AB (ref 35.0–47.0)
HGB: 10.4 g/dL — ABNORMAL LOW (ref 12.0–16.0)
Lymphocyte #: 1.9 x10 3/mm (ref 1.0–3.6)
Lymphocyte %: 14.3 %
MCH: 27.8 pg (ref 26.0–34.0)
MCHC: 32.3 g/dL (ref 32.0–36.0)
MCV: 86 fL (ref 80–100)
MONOS PCT: 4.8 %
Monocyte #: 0.7 x10 3/mm (ref 0.2–0.9)
NEUTROS ABS: 10.8 x10 3/mm — AB (ref 1.4–6.5)
Neutrophil %: 79.5 %
PLATELETS: 70 x10 3/mm — AB (ref 150–440)
RBC: 3.75 10*6/uL — ABNORMAL LOW (ref 3.80–5.20)
RDW: 20.6 % — AB (ref 11.5–14.5)
WBC: 13.6 x10 3/mm — ABNORMAL HIGH (ref 3.6–11.0)

## 2014-02-19 LAB — COMPREHENSIVE METABOLIC PANEL
ALBUMIN: 2.6 g/dL — AB (ref 3.4–5.0)
ALK PHOS: 121 U/L — AB
ALT: 9 U/L — AB (ref 12–78)
AST: 25 U/L (ref 15–37)
Anion Gap: 8 (ref 7–16)
BILIRUBIN TOTAL: 0.8 mg/dL (ref 0.2–1.0)
BUN: 2 mg/dL — ABNORMAL LOW (ref 7–18)
CALCIUM: 7.9 mg/dL — AB (ref 8.5–10.1)
CO2: 31 mmol/L (ref 21–32)
CREATININE: 0.72 mg/dL (ref 0.60–1.30)
Chloride: 102 mmol/L (ref 98–107)
EGFR (Non-African Amer.): >60
Glucose: 134 mg/dL — ABNORMAL HIGH (ref 65–99)
OSMOLALITY: 279 (ref 275–301)
Potassium: 2.9 mmol/L — ABNORMAL LOW (ref 3.5–5.1)
SODIUM: 141 mmol/L (ref 136–145)
Total Protein: 5.4 g/dL — ABNORMAL LOW (ref 6.4–8.2)

## 2014-02-26 ENCOUNTER — Ambulatory Visit: Payer: Self-pay | Admitting: Oncology

## 2014-02-26 LAB — BODY FLUID CELL COUNT WITH DIFFERENTIAL
BASOS ABS: 0 %
Eosinophil: 0 %
Lymphocytes: 15 %
Neutrophils: 26 %
Nucleated Cell Count: 486 /mm3
Other Cells BF: 0 %
Other Mononuclear Cells: 59 %

## 2014-02-26 LAB — PROTEIN, BODY FLUID: PROTEIN, BODY FLUID: 1.8 g/dL

## 2014-03-05 LAB — COMPREHENSIVE METABOLIC PANEL
ALBUMIN: 2.8 g/dL — AB (ref 3.4–5.0)
ANION GAP: 8 (ref 7–16)
Alkaline Phosphatase: 128 U/L — ABNORMAL HIGH
BILIRUBIN TOTAL: 0.9 mg/dL (ref 0.2–1.0)
BUN: 5 mg/dL — AB (ref 7–18)
CO2: 29 mmol/L (ref 21–32)
CREATININE: 0.75 mg/dL (ref 0.60–1.30)
Calcium, Total: 8.5 mg/dL (ref 8.5–10.1)
Chloride: 100 mmol/L (ref 98–107)
EGFR (African American): >60
EGFR (Non-African Amer.): >60
Glucose: 110 mg/dL — ABNORMAL HIGH (ref 65–99)
Osmolality: 272 (ref 275–301)
Potassium: 3.1 mmol/L — ABNORMAL LOW (ref 3.5–5.1)
SGOT(AST): 21 U/L (ref 15–37)
SGPT (ALT): 10 U/L — ABNORMAL LOW (ref 12–78)
Sodium: 137 mmol/L (ref 136–145)
Total Protein: 5.6 g/dL — ABNORMAL LOW (ref 6.4–8.2)

## 2014-03-05 LAB — CBC CANCER CENTER
BASOS ABS: 0.1 x10 3/mm (ref 0.0–0.1)
Basophil %: 0.5 %
EOS ABS: 0.1 x10 3/mm (ref 0.0–0.7)
Eosinophil %: 0.4 %
HCT: 34 % — ABNORMAL LOW (ref 35.0–47.0)
HGB: 10.5 g/dL — ABNORMAL LOW (ref 12.0–16.0)
LYMPHS PCT: 13.4 %
Lymphocyte #: 1.9 x10 3/mm (ref 1.0–3.6)
MCH: 27.4 pg (ref 26.0–34.0)
MCHC: 31 g/dL — AB (ref 32.0–36.0)
MCV: 88 fL (ref 80–100)
Monocyte #: 0.8 x10 3/mm (ref 0.2–0.9)
Monocyte %: 5.4 %
Neutrophil #: 11.7 x10 3/mm — ABNORMAL HIGH (ref 1.4–6.5)
Neutrophil %: 80.3 %
PLATELETS: 75 x10 3/mm — AB (ref 150–440)
RBC: 3.85 10*6/uL (ref 3.80–5.20)
RDW: 22.8 % — ABNORMAL HIGH (ref 11.5–14.5)
WBC: 14.6 x10 3/mm — ABNORMAL HIGH (ref 3.6–11.0)

## 2014-03-10 ENCOUNTER — Ambulatory Visit: Payer: Self-pay | Admitting: Oncology

## 2014-03-18 ENCOUNTER — Inpatient Hospital Stay: Payer: Self-pay | Admitting: Internal Medicine

## 2014-03-18 LAB — COMPREHENSIVE METABOLIC PANEL
AST: 26 U/L (ref 15–37)
Albumin: 2.9 g/dL — ABNORMAL LOW (ref 3.4–5.0)
Alkaline Phosphatase: 122 U/L — ABNORMAL HIGH
Anion Gap: 12 (ref 7–16)
BUN: 11 mg/dL (ref 7–18)
Bilirubin,Total: 1 mg/dL (ref 0.2–1.0)
CALCIUM: 9.1 mg/dL (ref 8.5–10.1)
CREATININE: 1.23 mg/dL (ref 0.60–1.30)
Chloride: 96 mmol/L — ABNORMAL LOW (ref 98–107)
Co2: 25 mmol/L (ref 21–32)
EGFR (African American): 58 — ABNORMAL LOW
EGFR (Non-African Amer.): 50 — ABNORMAL LOW
GLUCOSE: 160 mg/dL — AB (ref 65–99)
Osmolality: 269 (ref 275–301)
Potassium: 3.1 mmol/L — ABNORMAL LOW (ref 3.5–5.1)
SGPT (ALT): 14 U/L (ref 12–78)
Sodium: 133 mmol/L — ABNORMAL LOW (ref 136–145)
TOTAL PROTEIN: 5.7 g/dL — AB (ref 6.4–8.2)

## 2014-03-18 LAB — PROTIME-INR
INR: 1.5
Prothrombin Time: 17.9 secs — ABNORMAL HIGH (ref 11.5–14.7)

## 2014-03-18 LAB — CBC
HCT: 33.7 % — ABNORMAL LOW (ref 35.0–47.0)
HGB: 10.8 g/dL — ABNORMAL LOW (ref 12.0–16.0)
MCH: 27.8 pg (ref 26.0–34.0)
MCHC: 32.2 g/dL (ref 32.0–36.0)
MCV: 86 fL (ref 80–100)
Platelet: 103 10*3/uL — ABNORMAL LOW (ref 150–440)
RBC: 3.91 10*6/uL (ref 3.80–5.20)
RDW: 23.2 % — AB (ref 11.5–14.5)
WBC: 24.8 10*3/uL — ABNORMAL HIGH (ref 3.6–11.0)

## 2014-03-19 LAB — BASIC METABOLIC PANEL
Anion Gap: 4 — ABNORMAL LOW (ref 7–16)
BUN: 8 mg/dL (ref 7–18)
CHLORIDE: 103 mmol/L (ref 98–107)
CO2: 30 mmol/L (ref 21–32)
Calcium, Total: 8.2 mg/dL — ABNORMAL LOW (ref 8.5–10.1)
Creatinine: 0.94 mg/dL (ref 0.60–1.30)
EGFR (African American): >60
EGFR (Non-African Amer.): >60
Glucose: 109 mg/dL — ABNORMAL HIGH (ref 65–99)
OSMOLALITY: 273 (ref 275–301)
Potassium: 3.5 mmol/L (ref 3.5–5.1)
Sodium: 137 mmol/L (ref 136–145)

## 2014-03-19 LAB — CBC WITH DIFFERENTIAL/PLATELET
Bands: 3 %
HCT: 30.8 % — ABNORMAL LOW (ref 35.0–47.0)
HGB: 9.8 g/dL — AB (ref 12.0–16.0)
Lymphocytes: 10 %
MCH: 28 pg (ref 26.0–34.0)
MCHC: 31.8 g/dL — AB (ref 32.0–36.0)
MCV: 88 fL (ref 80–100)
Monocytes: 9 %
Platelet: 66 10*3/uL — ABNORMAL LOW (ref 150–440)
RBC: 3.5 10*6/uL — ABNORMAL LOW (ref 3.80–5.20)
RDW: 24.4 % — ABNORMAL HIGH (ref 11.5–14.5)
Segmented Neutrophils: 78 %
WBC: 14.4 10*3/uL — ABNORMAL HIGH (ref 3.6–11.0)

## 2014-03-19 LAB — MAGNESIUM: Magnesium: 1.8 mg/dL

## 2014-03-20 LAB — URINALYSIS, COMPLETE
RBC,UR: 1188 /HPF (ref 0–5)
Specific Gravity: 1.01 (ref 1.003–1.030)
WBC UR: 152 /HPF (ref 0–5)

## 2014-03-20 LAB — CBC WITH DIFFERENTIAL/PLATELET
Basophil #: 0.1 10*3/uL (ref 0.0–0.1)
Basophil %: 0.4 %
EOS ABS: 0 10*3/uL (ref 0.0–0.7)
Eosinophil %: 0.2 %
HCT: 28.7 % — ABNORMAL LOW (ref 35.0–47.0)
HGB: 9.2 g/dL — AB (ref 12.0–16.0)
Lymphocyte #: 1.5 10*3/uL (ref 1.0–3.6)
Lymphocyte %: 11 %
MCH: 28.1 pg (ref 26.0–34.0)
MCHC: 32 g/dL (ref 32.0–36.0)
MCV: 88 fL (ref 80–100)
MONOS PCT: 6.6 %
Monocyte #: 0.9 x10 3/mm (ref 0.2–0.9)
Neutrophil #: 11.2 10*3/uL — ABNORMAL HIGH (ref 1.4–6.5)
Neutrophil %: 81.8 %
Platelet: 52 10*3/uL — ABNORMAL LOW (ref 150–440)
RBC: 3.26 10*6/uL — ABNORMAL LOW (ref 3.80–5.20)
RDW: 23.8 % — ABNORMAL HIGH (ref 11.5–14.5)
WBC: 13.7 10*3/uL — ABNORMAL HIGH (ref 3.6–11.0)

## 2014-03-20 LAB — BASIC METABOLIC PANEL
Anion Gap: 6 — ABNORMAL LOW (ref 7–16)
BUN: 6 mg/dL — ABNORMAL LOW (ref 7–18)
CHLORIDE: 103 mmol/L (ref 98–107)
CO2: 29 mmol/L (ref 21–32)
CREATININE: 0.71 mg/dL (ref 0.60–1.30)
Calcium, Total: 8.1 mg/dL — ABNORMAL LOW (ref 8.5–10.1)
Glucose: 101 mg/dL — ABNORMAL HIGH (ref 65–99)
Osmolality: 273 (ref 275–301)
Potassium: 3.9 mmol/L (ref 3.5–5.1)
SODIUM: 138 mmol/L (ref 136–145)

## 2014-03-21 LAB — CBC WITH DIFFERENTIAL/PLATELET
Basophil #: 0 10*3/uL (ref 0.0–0.1)
Basophil %: 0.3 %
EOS PCT: 0.2 %
Eosinophil #: 0 10*3/uL (ref 0.0–0.7)
HCT: 27.8 % — ABNORMAL LOW (ref 35.0–47.0)
HGB: 9.1 g/dL — AB (ref 12.0–16.0)
LYMPHS ABS: 1 10*3/uL (ref 1.0–3.6)
Lymphocyte %: 7.9 %
MCH: 28.6 pg (ref 26.0–34.0)
MCHC: 32.6 g/dL (ref 32.0–36.0)
MCV: 88 fL (ref 80–100)
MONO ABS: 0.7 x10 3/mm (ref 0.2–0.9)
Monocyte %: 5.7 %
NEUTROS PCT: 85.9 %
Neutrophil #: 10.3 10*3/uL — ABNORMAL HIGH (ref 1.4–6.5)
Platelet: 38 10*3/uL — ABNORMAL LOW (ref 150–440)
RBC: 3.18 10*6/uL — AB (ref 3.80–5.20)
RDW: 23.5 % — AB (ref 11.5–14.5)
WBC: 12 10*3/uL — AB (ref 3.6–11.0)

## 2014-03-21 LAB — BASIC METABOLIC PANEL
Anion Gap: 7 (ref 7–16)
BUN: 9 mg/dL (ref 7–18)
CO2: 27 mmol/L (ref 21–32)
Calcium, Total: 7.9 mg/dL — ABNORMAL LOW (ref 8.5–10.1)
Chloride: 102 mmol/L (ref 98–107)
Creatinine: 0.7 mg/dL (ref 0.60–1.30)
EGFR (African American): >60
EGFR (Non-African Amer.): >60
GLUCOSE: 120 mg/dL — AB (ref 65–99)
Osmolality: 272 (ref 275–301)
Potassium: 4.1 mmol/L (ref 3.5–5.1)
Sodium: 136 mmol/L (ref 136–145)

## 2014-03-22 LAB — CBC WITH DIFFERENTIAL/PLATELET
Basophil #: 0.1 10*3/uL (ref 0.0–0.1)
Basophil #: 0.1 10*3/uL (ref 0.0–0.1)
Basophil %: 0.7 %
Basophil %: 0.3 %
EOS ABS: 0 10*3/uL (ref 0.0–0.7)
Eosinophil #: 0 10*3/uL (ref 0.0–0.7)
Eosinophil %: 0.2 %
Eosinophil %: 0 %
HCT: 27.2 % — AB (ref 35.0–47.0)
HCT: 22.7 % — ABNORMAL LOW (ref 35.0–47.0)
HGB: 8.8 g/dL — ABNORMAL LOW (ref 12.0–16.0)
HGB: 7.2 g/dL — AB (ref 12.0–16.0)
LYMPHS ABS: 0.6 10*3/uL — AB (ref 1.0–3.6)
Lymphocyte #: 1.3 10*3/uL (ref 1.0–3.6)
Lymphocyte %: 11.7 %
Lymphocyte %: 2.8 %
MCH: 28.7 pg (ref 26.0–34.0)
MCH: 28.3 pg (ref 26.0–34.0)
MCHC: 32.3 g/dL (ref 32.0–36.0)
MCHC: 31.8 g/dL — AB (ref 32.0–36.0)
MCV: 89 fL (ref 80–100)
MCV: 89 fL (ref 80–100)
MONO ABS: 0.7 x10 3/mm (ref 0.2–0.9)
Monocyte #: 0.5 x10 3/mm (ref 0.2–0.9)
Monocyte %: 5.8 %
Monocyte %: 2.2 %
NEUTROS ABS: 9.3 10*3/uL — AB (ref 1.4–6.5)
NEUTROS PCT: 81.6 %
Neutrophil #: 21 10*3/uL — ABNORMAL HIGH (ref 1.4–6.5)
Neutrophil %: 94.7 %
Platelet: 38 10*3/uL — ABNORMAL LOW (ref 150–440)
Platelet: 68 10*3/uL — ABNORMAL LOW (ref 150–440)
RBC: 3.06 10*6/uL — AB (ref 3.80–5.20)
RBC: 2.55 10*6/uL — ABNORMAL LOW (ref 3.80–5.20)
RDW: 24 % — AB (ref 11.5–14.5)
RDW: 23.6 % — AB (ref 11.5–14.5)
WBC: 11.4 10*3/uL — ABNORMAL HIGH (ref 3.6–11.0)
WBC: 22.2 10*3/uL — ABNORMAL HIGH (ref 3.6–11.0)

## 2014-03-22 LAB — PROTIME-INR
INR: 1.4
PROTHROMBIN TIME: 16.6 s — AB (ref 11.5–14.7)

## 2014-03-22 LAB — URINALYSIS, COMPLETE
RBC,UR: 18558 /HPF (ref 0–5)
Specific Gravity: 1.018 (ref 1.003–1.030)
Squamous Epithelial: NONE SEEN
WBC UR: 474 /HPF (ref 0–5)

## 2014-03-22 LAB — CREATININE, SERUM
Creatinine: 0.83 mg/dL (ref 0.60–1.30)
EGFR (Non-African Amer.): >60

## 2014-03-22 LAB — URINE CULTURE

## 2014-03-22 LAB — HEMOGLOBIN: HGB: 6.8 g/dL — ABNORMAL LOW (ref 12.0–16.0)

## 2014-03-23 LAB — CBC WITH DIFFERENTIAL/PLATELET
BASOS PCT: 0.4 %
Basophil #: 0 10*3/uL (ref 0.0–0.1)
EOS PCT: 0.3 %
Eosinophil #: 0 10*3/uL (ref 0.0–0.7)
HCT: 20.7 % — ABNORMAL LOW (ref 35.0–47.0)
HGB: 6.8 g/dL — ABNORMAL LOW (ref 12.0–16.0)
Lymphocyte #: 1 10*3/uL (ref 1.0–3.6)
Lymphocyte %: 9.5 %
MCH: 29.2 pg (ref 26.0–34.0)
MCHC: 33 g/dL (ref 32.0–36.0)
MCV: 88 fL (ref 80–100)
MONO ABS: 0.7 x10 3/mm (ref 0.2–0.9)
MONOS PCT: 6.8 %
NEUTROS PCT: 83 %
Neutrophil #: 8.8 10*3/uL — ABNORMAL HIGH (ref 1.4–6.5)
Platelet: 61 10*3/uL — ABNORMAL LOW (ref 150–440)
RBC: 2.34 10*6/uL — ABNORMAL LOW (ref 3.80–5.20)
RDW: 24 % — ABNORMAL HIGH (ref 11.5–14.5)
WBC: 10.6 10*3/uL (ref 3.6–11.0)

## 2014-03-23 LAB — BASIC METABOLIC PANEL
ANION GAP: 5 — AB (ref 7–16)
BUN: 7 mg/dL (ref 7–18)
Calcium, Total: 8.1 mg/dL — ABNORMAL LOW (ref 8.5–10.1)
Chloride: 104 mmol/L (ref 98–107)
Co2: 28 mmol/L (ref 21–32)
Creatinine: 0.67 mg/dL (ref 0.60–1.30)
EGFR (African American): >60
EGFR (Non-African Amer.): >60
GLUCOSE: 100 mg/dL — AB (ref 65–99)
Osmolality: 272 (ref 275–301)
Potassium: 4.1 mmol/L (ref 3.5–5.1)
SODIUM: 137 mmol/L (ref 136–145)

## 2014-03-24 LAB — LACTATE DEHYDROGENASE: LDH: 188 U/L (ref 81–246)

## 2014-03-24 LAB — CBC WITH DIFFERENTIAL/PLATELET
BASOS ABS: 0.1 10*3/uL (ref 0.0–0.1)
BASOS PCT: 0.4 %
Basophil #: 0 10*3/uL (ref 0.0–0.1)
Basophil %: 0.4 %
EOS ABS: 0 10*3/uL (ref 0.0–0.7)
EOS PCT: 0.3 %
Eosinophil #: 0 10*3/uL (ref 0.0–0.7)
Eosinophil %: 0.1 %
HCT: 15 % — AB (ref 35.0–47.0)
HCT: 22.6 % — ABNORMAL LOW (ref 35.0–47.0)
HGB: 5 g/dL — CL (ref 12.0–16.0)
HGB: 7.6 g/dL — ABNORMAL LOW (ref 12.0–16.0)
LYMPHS ABS: 1.2 10*3/uL (ref 1.0–3.6)
Lymphocyte #: 0.7 10*3/uL — ABNORMAL LOW (ref 1.0–3.6)
Lymphocyte %: 10.1 %
Lymphocyte %: 7.8 %
MCH: 30 pg (ref 26.0–34.0)
MCH: 29.3 pg (ref 26.0–34.0)
MCHC: 33.4 g/dL (ref 32.0–36.0)
MCHC: 33.5 g/dL (ref 32.0–36.0)
MCV: 90 fL (ref 80–100)
MCV: 88 fL (ref 80–100)
MONO ABS: 0.9 x10 3/mm (ref 0.2–0.9)
Monocyte #: 0.5 x10 3/mm (ref 0.2–0.9)
Monocyte %: 7.1 %
Monocyte %: 6.1 %
NEUTROS PCT: 82.1 %
NEUTROS PCT: 85.6 %
Neutrophil #: 6 10*3/uL (ref 1.4–6.5)
Neutrophil #: 13 10*3/uL — ABNORMAL HIGH (ref 1.4–6.5)
PLATELETS: 63 10*3/uL — AB (ref 150–440)
Platelet: 36 10*3/uL — ABNORMAL LOW (ref 150–440)
RBC: 1.67 10*6/uL — AB (ref 3.80–5.20)
RBC: 2.58 10*6/uL — ABNORMAL LOW (ref 3.80–5.20)
RDW: 21.5 % — ABNORMAL HIGH (ref 11.5–14.5)
RDW: 19.9 % — AB (ref 11.5–14.5)
WBC: 7.3 10*3/uL (ref 3.6–11.0)
WBC: 15.2 10*3/uL — AB (ref 3.6–11.0)

## 2014-03-24 LAB — BASIC METABOLIC PANEL
Anion Gap: 4 — ABNORMAL LOW (ref 7–16)
BUN: 7 mg/dL (ref 7–18)
CALCIUM: 8.2 mg/dL — AB (ref 8.5–10.1)
CO2: 29 mmol/L (ref 21–32)
Chloride: 102 mmol/L (ref 98–107)
Creatinine: 0.5 mg/dL — ABNORMAL LOW (ref 0.60–1.30)
EGFR (African American): >60
Glucose: 93 mg/dL (ref 65–99)
OSMOLALITY: 268 (ref 275–301)
Potassium: 4.4 mmol/L (ref 3.5–5.1)
Sodium: 135 mmol/L — ABNORMAL LOW (ref 136–145)

## 2014-03-24 LAB — URINE CULTURE

## 2014-03-24 LAB — HEMATOCRIT: HCT: 21.2 % — ABNORMAL LOW (ref 35.0–47.0)

## 2014-03-25 LAB — CBC WITH DIFFERENTIAL/PLATELET
BASOS ABS: 0 10*3/uL (ref 0.0–0.1)
Basophil %: 0.4 %
EOS ABS: 0 10*3/uL (ref 0.0–0.7)
Eosinophil %: 0.2 %
HCT: 22.2 % — ABNORMAL LOW (ref 35.0–47.0)
HGB: 7.3 g/dL — AB (ref 12.0–16.0)
LYMPHS PCT: 10.7 %
Lymphocyte #: 1.4 10*3/uL (ref 1.0–3.6)
MCH: 29 pg (ref 26.0–34.0)
MCHC: 32.7 g/dL (ref 32.0–36.0)
MCV: 89 fL (ref 80–100)
MONO ABS: 0.9 x10 3/mm (ref 0.2–0.9)
MONOS PCT: 7.1 %
NEUTROS ABS: 10.5 10*3/uL — AB (ref 1.4–6.5)
NEUTROS PCT: 81.6 %
PLATELETS: 58 10*3/uL — AB (ref 150–440)
RBC: 2.5 10*6/uL — AB (ref 3.80–5.20)
RDW: 19.8 % — ABNORMAL HIGH (ref 11.5–14.5)
WBC: 12.9 10*3/uL — AB (ref 3.6–11.0)

## 2014-03-25 LAB — PROTIME-INR
INR: 1.4
Prothrombin Time: 16.8 secs — ABNORMAL HIGH (ref 11.5–14.7)

## 2014-03-26 LAB — CBC WITH DIFFERENTIAL/PLATELET
Basophil #: 0.1 10*3/uL (ref 0.0–0.1)
Basophil %: 0.7 %
Eosinophil #: 0 10*3/uL (ref 0.0–0.7)
Eosinophil %: 0.3 %
HCT: 23.8 % — ABNORMAL LOW (ref 35.0–47.0)
HGB: 8 g/dL — ABNORMAL LOW (ref 12.0–16.0)
Lymphocyte #: 1.1 10*3/uL (ref 1.0–3.6)
Lymphocyte %: 15.2 %
MCH: 29.9 pg (ref 26.0–34.0)
MCHC: 33.4 g/dL (ref 32.0–36.0)
MCV: 90 fL (ref 80–100)
Monocyte #: 0.7 x10 3/mm (ref 0.2–0.9)
Monocyte %: 9.6 %
Neutrophil #: 5.4 10*3/uL (ref 1.4–6.5)
Neutrophil %: 74.2 %
Platelet: 57 10*3/uL — ABNORMAL LOW (ref 150–440)
RBC: 2.66 10*6/uL — ABNORMAL LOW (ref 3.80–5.20)
RDW: 19.2 % — ABNORMAL HIGH (ref 11.5–14.5)
WBC: 7.3 10*3/uL (ref 3.6–11.0)

## 2014-03-26 LAB — BASIC METABOLIC PANEL
Anion Gap: 4 — ABNORMAL LOW (ref 7–16)
BUN: 5 mg/dL — AB (ref 7–18)
CALCIUM: 8 mg/dL — AB (ref 8.5–10.1)
CO2: 30 mmol/L (ref 21–32)
CREATININE: 0.45 mg/dL — AB (ref 0.60–1.30)
Chloride: 103 mmol/L (ref 98–107)
EGFR (African American): >60
GLUCOSE: 89 mg/dL (ref 65–99)
OSMOLALITY: 271 (ref 275–301)
POTASSIUM: 4 mmol/L (ref 3.5–5.1)
Sodium: 137 mmol/L (ref 136–145)

## 2014-03-27 LAB — CBC WITH DIFFERENTIAL/PLATELET
BASOS ABS: 0 10*3/uL (ref 0.0–0.1)
Basophil %: 0.9 %
Eosinophil #: 0 10*3/uL (ref 0.0–0.7)
Eosinophil %: 0.6 %
HCT: 22 % — ABNORMAL LOW (ref 35.0–47.0)
HGB: 7.4 g/dL — ABNORMAL LOW (ref 12.0–16.0)
Lymphocyte #: 1.2 10*3/uL (ref 1.0–3.6)
Lymphocyte %: 21.9 %
MCH: 30.4 pg (ref 26.0–34.0)
MCHC: 33.5 g/dL (ref 32.0–36.0)
MCV: 91 fL (ref 80–100)
Monocyte #: 0.6 x10 3/mm (ref 0.2–0.9)
Monocyte %: 10.5 %
NEUTROS PCT: 66.1 %
Neutrophil #: 3.7 10*3/uL (ref 1.4–6.5)
PLATELETS: 57 10*3/uL — AB (ref 150–440)
RBC: 2.42 10*6/uL — ABNORMAL LOW (ref 3.80–5.20)
RDW: 19.9 % — AB (ref 11.5–14.5)
WBC: 5.7 10*3/uL (ref 3.6–11.0)

## 2014-03-28 LAB — CBC WITH DIFFERENTIAL/PLATELET
BASOS PCT: 0.6 %
Basophil #: 0 10*3/uL (ref 0.0–0.1)
EOS ABS: 0 10*3/uL (ref 0.0–0.7)
Eosinophil %: 0.8 %
HCT: 23.5 % — ABNORMAL LOW (ref 35.0–47.0)
HGB: 7.7 g/dL — AB (ref 12.0–16.0)
LYMPHS ABS: 1.3 10*3/uL (ref 1.0–3.6)
Lymphocyte %: 21.4 %
MCH: 29.7 pg (ref 26.0–34.0)
MCHC: 32.7 g/dL (ref 32.0–36.0)
MCV: 91 fL (ref 80–100)
Monocyte #: 0.6 x10 3/mm (ref 0.2–0.9)
Monocyte %: 10 %
NEUTROS PCT: 67.2 %
Neutrophil #: 4.1 10*3/uL (ref 1.4–6.5)
PLATELETS: 66 10*3/uL — AB (ref 150–440)
RBC: 2.58 10*6/uL — AB (ref 3.80–5.20)
RDW: 20.6 % — ABNORMAL HIGH (ref 11.5–14.5)
WBC: 6.1 10*3/uL (ref 3.6–11.0)

## 2014-03-29 LAB — CBC WITH DIFFERENTIAL/PLATELET
COMMENT - H1-COM4: NORMAL
Eosinophil: 3 %
HCT: 23.7 % — ABNORMAL LOW (ref 35.0–47.0)
HGB: 7.7 g/dL — ABNORMAL LOW (ref 12.0–16.0)
Lymphocytes: 19 %
MCH: 29.7 pg (ref 26.0–34.0)
MCHC: 32.4 g/dL (ref 32.0–36.0)
MCV: 92 fL (ref 80–100)
Monocytes: 7 %
Platelet: 69 10*3/uL — ABNORMAL LOW (ref 150–440)
RBC: 2.59 10*6/uL — ABNORMAL LOW (ref 3.80–5.20)
RDW: 20.2 % — ABNORMAL HIGH (ref 11.5–14.5)
Segmented Neutrophils: 70 %
Variant Lymphocyte - H1-Rlymph: 1 %
WBC: 5.4 10*3/uL (ref 3.6–11.0)

## 2014-03-30 LAB — CBC WITH DIFFERENTIAL/PLATELET
BASOS ABS: 0.1 10*3/uL (ref 0.0–0.1)
Basophil %: 0.9 %
Eosinophil #: 0.2 10*3/uL (ref 0.0–0.7)
Eosinophil %: 2.7 %
HCT: 27.1 % — ABNORMAL LOW (ref 35.0–47.0)
HGB: 8.9 g/dL — ABNORMAL LOW (ref 12.0–16.0)
LYMPHS ABS: 1.5 10*3/uL (ref 1.0–3.6)
LYMPHS PCT: 23.5 %
MCH: 30.5 pg (ref 26.0–34.0)
MCHC: 33 g/dL (ref 32.0–36.0)
MCV: 92 fL (ref 80–100)
Monocyte #: 0.6 x10 3/mm (ref 0.2–0.9)
Monocyte %: 8.5 %
NEUTROS ABS: 4.2 10*3/uL (ref 1.4–6.5)
NEUTROS PCT: 64.4 %
PLATELETS: 92 10*3/uL — AB (ref 150–440)
RBC: 2.93 10*6/uL — ABNORMAL LOW (ref 3.80–5.20)
RDW: 21.2 % — ABNORMAL HIGH (ref 11.5–14.5)
WBC: 6.6 10*3/uL (ref 3.6–11.0)

## 2014-03-30 LAB — BASIC METABOLIC PANEL
Anion Gap: 5 — ABNORMAL LOW (ref 7–16)
BUN: 5 mg/dL — ABNORMAL LOW (ref 7–18)
CO2: 31 mmol/L (ref 21–32)
Calcium, Total: 8.6 mg/dL (ref 8.5–10.1)
Chloride: 102 mmol/L (ref 98–107)
Creatinine: 0.64 mg/dL (ref 0.60–1.30)
EGFR (Non-African Amer.): >60
GLUCOSE: 94 mg/dL (ref 65–99)
Osmolality: 273 (ref 275–301)
Potassium: 4 mmol/L (ref 3.5–5.1)
Sodium: 138 mmol/L (ref 136–145)

## 2014-03-31 LAB — CBC WITH DIFFERENTIAL/PLATELET
BASOS PCT: 0.8 %
Basophil #: 0 10*3/uL (ref 0.0–0.1)
EOS ABS: 0.3 10*3/uL (ref 0.0–0.7)
Eosinophil %: 5 %
HCT: 24.4 % — ABNORMAL LOW (ref 35.0–47.0)
HGB: 7.8 g/dL — AB (ref 12.0–16.0)
LYMPHS PCT: 27.1 %
Lymphocyte #: 1.5 10*3/uL (ref 1.0–3.6)
MCH: 29.3 pg (ref 26.0–34.0)
MCHC: 31.8 g/dL — ABNORMAL LOW (ref 32.0–36.0)
MCV: 92 fL (ref 80–100)
Monocyte #: 0.5 x10 3/mm (ref 0.2–0.9)
Monocyte %: 9.7 %
Neutrophil #: 3.2 10*3/uL (ref 1.4–6.5)
Neutrophil %: 57.4 %
PLATELETS: 79 10*3/uL — AB (ref 150–440)
RBC: 2.65 10*6/uL — ABNORMAL LOW (ref 3.80–5.20)
RDW: 20.6 % — ABNORMAL HIGH (ref 11.5–14.5)
WBC: 5.6 10*3/uL (ref 3.6–11.0)

## 2014-04-08 LAB — COMPREHENSIVE METABOLIC PANEL
ALK PHOS: 115 U/L
ANION GAP: 6 — AB (ref 7–16)
Albumin: 2.5 g/dL — ABNORMAL LOW (ref 3.4–5.0)
BUN: 8 mg/dL (ref 7–18)
Bilirubin,Total: 0.9 mg/dL (ref 0.2–1.0)
Calcium, Total: 9.2 mg/dL (ref 8.5–10.1)
Chloride: 103 mmol/L (ref 98–107)
Co2: 31 mmol/L (ref 21–32)
Creatinine: 0.82 mg/dL (ref 0.60–1.30)
EGFR (African American): >60
GLUCOSE: 85 mg/dL (ref 65–99)
Osmolality: 277 (ref 275–301)
POTASSIUM: 3.7 mmol/L (ref 3.5–5.1)
SGOT(AST): 20 U/L (ref 15–37)
SGPT (ALT): 10 U/L — ABNORMAL LOW (ref 12–78)
Sodium: 140 mmol/L (ref 136–145)
Total Protein: 6 g/dL — ABNORMAL LOW (ref 6.4–8.2)

## 2014-04-08 LAB — CBC CANCER CENTER
BASOS PCT: 1.2 %
Basophil #: 0.1 x10 3/mm (ref 0.0–0.1)
EOS PCT: 9.8 %
Eosinophil #: 0.5 x10 3/mm (ref 0.0–0.7)
HCT: 30.7 % — AB (ref 35.0–47.0)
HGB: 10.1 g/dL — ABNORMAL LOW (ref 12.0–16.0)
Lymphocyte #: 1.4 x10 3/mm (ref 1.0–3.6)
Lymphocyte %: 28.3 %
MCH: 31 pg (ref 26.0–34.0)
MCHC: 32.9 g/dL (ref 32.0–36.0)
MCV: 94 fL (ref 80–100)
MONOS PCT: 6 %
Monocyte #: 0.3 x10 3/mm (ref 0.2–0.9)
NEUTROS PCT: 54.7 %
Neutrophil #: 2.7 x10 3/mm (ref 1.4–6.5)
PLATELETS: 106 x10 3/mm — AB (ref 150–440)
RBC: 3.26 10*6/uL — AB (ref 3.80–5.20)
RDW: 20.9 % — ABNORMAL HIGH (ref 11.5–14.5)
WBC: 5 x10 3/mm (ref 3.6–11.0)

## 2014-04-09 ENCOUNTER — Ambulatory Visit: Payer: Self-pay | Admitting: Oncology

## 2014-04-22 LAB — COMPREHENSIVE METABOLIC PANEL
ALK PHOS: 135 U/L — AB
ANION GAP: 6 — AB (ref 7–16)
Albumin: 2.5 g/dL — ABNORMAL LOW (ref 3.4–5.0)
BUN: 6 mg/dL — ABNORMAL LOW (ref 7–18)
Bilirubin,Total: 0.5 mg/dL (ref 0.2–1.0)
CREATININE: 0.81 mg/dL (ref 0.60–1.30)
Calcium, Total: 9.3 mg/dL (ref 8.5–10.1)
Chloride: 103 mmol/L (ref 98–107)
Co2: 31 mmol/L (ref 21–32)
EGFR (Non-African Amer.): >60
GLUCOSE: 143 mg/dL — AB (ref 65–99)
Osmolality: 279 (ref 275–301)
POTASSIUM: 3.6 mmol/L (ref 3.5–5.1)
SGOT(AST): 23 U/L (ref 15–37)
SGPT (ALT): 15 U/L (ref 12–78)
Sodium: 140 mmol/L (ref 136–145)
TOTAL PROTEIN: 5.9 g/dL — AB (ref 6.4–8.2)

## 2014-04-22 LAB — CBC CANCER CENTER
Basophil #: 0 x10 3/mm (ref 0.0–0.1)
Basophil %: 1.5 %
EOS ABS: 0 x10 3/mm (ref 0.0–0.7)
EOS PCT: 0.1 %
HCT: 32.5 % — ABNORMAL LOW (ref 35.0–47.0)
HGB: 10.6 g/dL — AB (ref 12.0–16.0)
LYMPHS PCT: 58.5 %
Lymphocyte #: 1.1 x10 3/mm (ref 1.0–3.6)
MCH: 30.3 pg (ref 26.0–34.0)
MCHC: 32.5 g/dL (ref 32.0–36.0)
MCV: 93 fL (ref 80–100)
MONOS PCT: 23 %
Monocyte #: 0.4 x10 3/mm (ref 0.2–0.9)
Neutrophil #: 0.3 x10 3/mm — ABNORMAL LOW (ref 1.4–6.5)
Neutrophil %: 16.9 %
PLATELETS: 76 x10 3/mm — AB (ref 150–440)
RBC: 3.49 10*6/uL — ABNORMAL LOW (ref 3.80–5.20)
RDW: 17.9 % — ABNORMAL HIGH (ref 11.5–14.5)
WBC: 1.9 x10 3/mm — CL (ref 3.6–11.0)

## 2014-04-23 LAB — CANCER ANTIGEN 27.29: CA 27.29: 52.6 U/mL — ABNORMAL HIGH (ref 0.0–38.6)

## 2014-04-29 LAB — CBC CANCER CENTER
Basophil #: 0 x10 3/mm (ref 0.0–0.1)
Basophil %: 0.7 %
EOS PCT: 0.3 %
Eosinophil #: 0 x10 3/mm (ref 0.0–0.7)
HCT: 32.5 % — AB (ref 35.0–47.0)
HGB: 10.4 g/dL — ABNORMAL LOW (ref 12.0–16.0)
LYMPHS PCT: 13.9 %
Lymphocyte #: 0.9 x10 3/mm — ABNORMAL LOW (ref 1.0–3.6)
MCH: 30 pg (ref 26.0–34.0)
MCHC: 32.1 g/dL (ref 32.0–36.0)
MCV: 93 fL (ref 80–100)
Monocyte #: 0.5 x10 3/mm (ref 0.2–0.9)
Monocyte %: 7.4 %
NEUTROS ABS: 5.2 x10 3/mm (ref 1.4–6.5)
Neutrophil %: 77.7 %
PLATELETS: 67 x10 3/mm — AB (ref 150–440)
RBC: 3.48 10*6/uL — ABNORMAL LOW (ref 3.80–5.20)
RDW: 18.2 % — AB (ref 11.5–14.5)
WBC: 6.6 x10 3/mm (ref 3.6–11.0)

## 2014-05-06 LAB — COMPREHENSIVE METABOLIC PANEL
ALBUMIN: 2.5 g/dL — AB (ref 3.4–5.0)
ANION GAP: 7 (ref 7–16)
Alkaline Phosphatase: 99 U/L
BUN: 3 mg/dL — ABNORMAL LOW (ref 7–18)
Bilirubin,Total: 0.6 mg/dL (ref 0.2–1.0)
CALCIUM: 8.1 mg/dL — AB (ref 8.5–10.1)
CO2: 33 mmol/L — AB (ref 21–32)
CREATININE: 0.55 mg/dL — AB (ref 0.60–1.30)
Chloride: 102 mmol/L (ref 98–107)
Glucose: 106 mg/dL — ABNORMAL HIGH (ref 65–99)
Osmolality: 280 (ref 275–301)
Potassium: 3 mmol/L — ABNORMAL LOW (ref 3.5–5.1)
SGOT(AST): 20 U/L (ref 15–37)
SGPT (ALT): 18 U/L
SODIUM: 142 mmol/L (ref 136–145)
TOTAL PROTEIN: 5.7 g/dL — AB (ref 6.4–8.2)

## 2014-05-06 LAB — CBC CANCER CENTER
BASOS PCT: 0.8 %
Basophil #: 0 x10 3/mm (ref 0.0–0.1)
EOS PCT: 0.3 %
Eosinophil #: 0 x10 3/mm (ref 0.0–0.7)
HCT: 28 % — AB (ref 35.0–47.0)
HGB: 9.1 g/dL — ABNORMAL LOW (ref 12.0–16.0)
LYMPHS ABS: 0.9 x10 3/mm — AB (ref 1.0–3.6)
Lymphocyte %: 43.4 %
MCH: 30.5 pg (ref 26.0–34.0)
MCHC: 32.5 g/dL (ref 32.0–36.0)
MCV: 94 fL (ref 80–100)
MONO ABS: 0.2 x10 3/mm (ref 0.2–0.9)
MONOS PCT: 8.1 %
Neutrophil #: 1 x10 3/mm — ABNORMAL LOW (ref 1.4–6.5)
Neutrophil %: 47.4 %
Platelet: 63 x10 3/mm — ABNORMAL LOW (ref 150–440)
RBC: 2.98 10*6/uL — ABNORMAL LOW (ref 3.80–5.20)
RDW: 17 % — ABNORMAL HIGH (ref 11.5–14.5)
WBC: 2.1 x10 3/mm — AB (ref 3.6–11.0)

## 2014-05-07 LAB — CANCER ANTIGEN 27.29: CA 27.29: 38.7 U/mL — AB (ref 0.0–38.6)

## 2014-05-10 ENCOUNTER — Ambulatory Visit: Payer: Self-pay | Admitting: Oncology

## 2014-05-13 LAB — CBC WITH DIFFERENTIAL/PLATELET
Basophil #: 0 10*3/uL (ref 0.0–0.1)
Basophil %: 1.7 %
EOS ABS: 0 10*3/uL (ref 0.0–0.7)
Eosinophil %: 0.4 %
HCT: 29.7 % — AB (ref 35.0–47.0)
HGB: 9.6 g/dL — AB (ref 12.0–16.0)
LYMPHS ABS: 0.9 10*3/uL — AB (ref 1.0–3.6)
Lymphocyte %: 48.3 %
MCH: 29.8 pg (ref 26.0–34.0)
MCHC: 32.4 g/dL (ref 32.0–36.0)
MCV: 92 fL (ref 80–100)
Monocyte #: 0.6 x10 3/mm (ref 0.2–0.9)
Monocyte %: 30.5 %
NEUTROS PCT: 19.1 %
Neutrophil #: 0.4 10*3/uL — ABNORMAL LOW (ref 1.4–6.5)
Platelet: 98 10*3/uL — ABNORMAL LOW (ref 150–440)
RBC: 3.23 10*6/uL — AB (ref 3.80–5.20)
RDW: 17.6 % — ABNORMAL HIGH (ref 11.5–14.5)
WBC: 1.9 10*3/uL — CL (ref 3.6–11.0)

## 2014-05-20 LAB — COMPREHENSIVE METABOLIC PANEL
ALBUMIN: 2.4 g/dL — AB (ref 3.4–5.0)
ALT: 10 U/L — AB
ANION GAP: 6 — AB (ref 7–16)
Alkaline Phosphatase: 103 U/L
BUN: 7 mg/dL (ref 7–18)
Bilirubin,Total: 0.4 mg/dL (ref 0.2–1.0)
CO2: 31 mmol/L (ref 21–32)
CREATININE: 0.89 mg/dL (ref 0.60–1.30)
Calcium, Total: 8.2 mg/dL — ABNORMAL LOW (ref 8.5–10.1)
Chloride: 103 mmol/L (ref 98–107)
Glucose: 115 mg/dL — ABNORMAL HIGH (ref 65–99)
OSMOLALITY: 278 (ref 275–301)
POTASSIUM: 3.7 mmol/L (ref 3.5–5.1)
SGOT(AST): 13 U/L — ABNORMAL LOW (ref 15–37)
Sodium: 140 mmol/L (ref 136–145)
TOTAL PROTEIN: 5.4 g/dL — AB (ref 6.4–8.2)

## 2014-05-20 LAB — CBC CANCER CENTER
Basophil #: 0 x10 3/mm (ref 0.0–0.1)
Basophil %: 0.6 %
EOS ABS: 0 x10 3/mm (ref 0.0–0.7)
Eosinophil %: 0.4 %
HCT: 21.7 % — ABNORMAL LOW (ref 35.0–47.0)
HGB: 6.9 g/dL — ABNORMAL LOW (ref 12.0–16.0)
Lymphocyte #: 0.6 x10 3/mm — ABNORMAL LOW (ref 1.0–3.6)
Lymphocyte %: 18.9 %
MCH: 29.6 pg (ref 26.0–34.0)
MCHC: 31.9 g/dL — AB (ref 32.0–36.0)
MCV: 93 fL (ref 80–100)
Monocyte #: 0.3 x10 3/mm (ref 0.2–0.9)
Monocyte %: 9.9 %
NEUTROS ABS: 2.2 x10 3/mm (ref 1.4–6.5)
NEUTROS PCT: 70.2 %
PLATELETS: 45 x10 3/mm — AB (ref 150–440)
RBC: 2.34 10*6/uL — ABNORMAL LOW (ref 3.80–5.20)
RDW: 17.5 % — ABNORMAL HIGH (ref 11.5–14.5)
WBC: 3.2 x10 3/mm — ABNORMAL LOW (ref 3.6–11.0)

## 2014-05-20 LAB — FERRITIN: Ferritin (ARMC): 180 ng/mL (ref 8–388)

## 2014-05-20 LAB — IRON AND TIBC
IRON SATURATION: 20 %
Iron Bind.Cap.(Total): 183 ug/dL — ABNORMAL LOW (ref 250–450)
Iron: 36 ug/dL — ABNORMAL LOW (ref 50–170)
Unbound Iron-Bind.Cap.: 147 ug/dL

## 2014-05-27 LAB — CBC CANCER CENTER
BASOS ABS: 0 x10 3/mm (ref 0.0–0.1)
Basophil %: 0.8 %
EOS ABS: 0.3 x10 3/mm (ref 0.0–0.7)
Eosinophil %: 6 %
HCT: 36.8 % (ref 35.0–47.0)
HGB: 11.9 g/dL — ABNORMAL LOW (ref 12.0–16.0)
Lymphocyte #: 0.8 x10 3/mm — ABNORMAL LOW (ref 1.0–3.6)
Lymphocyte %: 17.8 %
MCH: 29.4 pg (ref 26.0–34.0)
MCHC: 32.3 g/dL (ref 32.0–36.0)
MCV: 91 fL (ref 80–100)
Monocyte #: 0.5 x10 3/mm (ref 0.2–0.9)
Monocyte %: 10.2 %
NEUTROS ABS: 2.9 x10 3/mm (ref 1.4–6.5)
Neutrophil %: 65.2 %
Platelet: 66 x10 3/mm — ABNORMAL LOW (ref 150–440)
RBC: 4.04 10*6/uL (ref 3.80–5.20)
RDW: 16.7 % — ABNORMAL HIGH (ref 11.5–14.5)
WBC: 4.5 x10 3/mm (ref 3.6–11.0)

## 2014-05-27 LAB — COMPREHENSIVE METABOLIC PANEL
AST: 19 U/L (ref 15–37)
Albumin: 2.7 g/dL — ABNORMAL LOW (ref 3.4–5.0)
Alkaline Phosphatase: 105 U/L
Anion Gap: 7 (ref 7–16)
BILIRUBIN TOTAL: 0.5 mg/dL (ref 0.2–1.0)
BUN: 9 mg/dL (ref 7–18)
CHLORIDE: 102 mmol/L (ref 98–107)
CO2: 31 mmol/L (ref 21–32)
Calcium, Total: 8.8 mg/dL (ref 8.5–10.1)
Creatinine: 0.83 mg/dL (ref 0.60–1.30)
EGFR (African American): >60
Glucose: 92 mg/dL (ref 65–99)
OSMOLALITY: 278 (ref 275–301)
Potassium: 4 mmol/L (ref 3.5–5.1)
SGPT (ALT): 13 U/L — ABNORMAL LOW
Sodium: 140 mmol/L (ref 136–145)
TOTAL PROTEIN: 6.1 g/dL — AB (ref 6.4–8.2)

## 2014-06-10 ENCOUNTER — Ambulatory Visit: Payer: Self-pay | Admitting: Oncology

## 2014-06-10 LAB — COMPREHENSIVE METABOLIC PANEL
ALK PHOS: 105 U/L
AST: 15 U/L (ref 15–37)
Albumin: 2.8 g/dL — ABNORMAL LOW (ref 3.4–5.0)
Anion Gap: 4 — ABNORMAL LOW (ref 7–16)
BUN: 8 mg/dL (ref 7–18)
Bilirubin,Total: 0.5 mg/dL (ref 0.2–1.0)
CALCIUM: 9.2 mg/dL (ref 8.5–10.1)
CHLORIDE: 102 mmol/L (ref 98–107)
CO2: 34 mmol/L — AB (ref 21–32)
Creatinine: 0.96 mg/dL (ref 0.60–1.30)
EGFR (African American): >60
EGFR (Non-African Amer.): >60
Glucose: 153 mg/dL — ABNORMAL HIGH (ref 65–99)
OSMOLALITY: 281 (ref 275–301)
Potassium: 4.1 mmol/L (ref 3.5–5.1)
SGPT (ALT): 15 U/L
Sodium: 140 mmol/L (ref 136–145)
Total Protein: 5.9 g/dL — ABNORMAL LOW (ref 6.4–8.2)

## 2014-06-10 LAB — CBC CANCER CENTER
Basophil #: 0 x10 3/mm (ref 0.0–0.1)
Basophil %: 1.7 %
EOS ABS: 0 x10 3/mm (ref 0.0–0.7)
Eosinophil %: 1 %
HCT: 34.4 % — AB (ref 35.0–47.0)
HGB: 11.3 g/dL — ABNORMAL LOW (ref 12.0–16.0)
Lymphocyte #: 0.9 x10 3/mm — ABNORMAL LOW (ref 1.0–3.6)
Lymphocyte %: 46.4 %
MCH: 29.5 pg (ref 26.0–34.0)
MCHC: 32.9 g/dL (ref 32.0–36.0)
MCV: 90 fL (ref 80–100)
Monocyte #: 0.5 x10 3/mm (ref 0.2–0.9)
Monocyte %: 24 %
NEUTROS ABS: 0.5 x10 3/mm — AB (ref 1.4–6.5)
Neutrophil %: 26.9 %
PLATELETS: 69 x10 3/mm — AB (ref 150–440)
RBC: 3.83 10*6/uL (ref 3.80–5.20)
RDW: 16.6 % — ABNORMAL HIGH (ref 11.5–14.5)
WBC: 2 x10 3/mm — AB (ref 3.6–11.0)

## 2014-06-18 LAB — CBC CANCER CENTER
BASOS PCT: 0.5 %
Basophil #: 0 x10 3/mm (ref 0.0–0.1)
Eosinophil #: 0.1 x10 3/mm (ref 0.0–0.7)
Eosinophil %: 1.4 %
HCT: 35 % (ref 35.0–47.0)
HGB: 11.2 g/dL — ABNORMAL LOW (ref 12.0–16.0)
Lymphocyte #: 1.1 x10 3/mm (ref 1.0–3.6)
Lymphocyte %: 23.9 %
MCH: 29 pg (ref 26.0–34.0)
MCHC: 32.1 g/dL (ref 32.0–36.0)
MCV: 90 fL (ref 80–100)
MONO ABS: 0.5 x10 3/mm (ref 0.2–0.9)
Monocyte %: 12.3 %
NEUTROS PCT: 61.9 %
Neutrophil #: 2.7 x10 3/mm (ref 1.4–6.5)
Platelet: 42 x10 3/mm — ABNORMAL LOW (ref 150–440)
RBC: 3.88 10*6/uL (ref 3.80–5.20)
RDW: 16.8 % — AB (ref 11.5–14.5)
WBC: 4.4 x10 3/mm (ref 3.6–11.0)

## 2014-07-03 LAB — CBC CANCER CENTER
Basophil #: 0 x10 3/mm (ref 0.0–0.1)
Basophil %: 1.1 %
Eosinophil #: 0.3 x10 3/mm (ref 0.0–0.7)
Eosinophil %: 8.5 %
HCT: 35.3 % (ref 35.0–47.0)
HGB: 11.5 g/dL — ABNORMAL LOW (ref 12.0–16.0)
LYMPHS ABS: 1 x10 3/mm (ref 1.0–3.6)
Lymphocyte %: 28 %
MCH: 29.4 pg (ref 26.0–34.0)
MCHC: 32.7 g/dL (ref 32.0–36.0)
MCV: 90 fL (ref 80–100)
Monocyte #: 0.4 x10 3/mm (ref 0.2–0.9)
Monocyte %: 11.4 %
Neutrophil #: 1.8 x10 3/mm (ref 1.4–6.5)
Neutrophil %: 51 %
PLATELETS: 67 x10 3/mm — AB (ref 150–440)
RBC: 3.93 10*6/uL (ref 3.80–5.20)
RDW: 17.9 % — ABNORMAL HIGH (ref 11.5–14.5)
WBC: 3.5 x10 3/mm — ABNORMAL LOW (ref 3.6–11.0)

## 2014-07-03 LAB — COMPREHENSIVE METABOLIC PANEL
ALK PHOS: 89 U/L
Albumin: 3.2 g/dL — ABNORMAL LOW (ref 3.4–5.0)
Anion Gap: 7 (ref 7–16)
BUN: 13 mg/dL (ref 7–18)
Bilirubin,Total: 0.5 mg/dL (ref 0.2–1.0)
CALCIUM: 9.4 mg/dL (ref 8.5–10.1)
CO2: 33 mmol/L — AB (ref 21–32)
Chloride: 97 mmol/L — ABNORMAL LOW (ref 98–107)
Creatinine: 0.92 mg/dL (ref 0.60–1.30)
EGFR (African American): >60
EGFR (Non-African Amer.): >60
Glucose: 121 mg/dL — ABNORMAL HIGH (ref 65–99)
Osmolality: 275 (ref 275–301)
Potassium: 3.9 mmol/L (ref 3.5–5.1)
SGOT(AST): 14 U/L — ABNORMAL LOW (ref 15–37)
SGPT (ALT): 16 U/L
Sodium: 137 mmol/L (ref 136–145)
TOTAL PROTEIN: 6.1 g/dL — AB (ref 6.4–8.2)

## 2014-07-04 LAB — CANCER ANTIGEN 27.29: CA 27.29: 25.3 U/mL (ref 0.0–38.6)

## 2014-07-10 ENCOUNTER — Ambulatory Visit: Payer: Self-pay | Admitting: Oncology

## 2014-07-10 LAB — CBC CANCER CENTER
Basophil #: 0.1 x10 3/mm (ref 0.0–0.1)
Basophil %: 1.2 %
Eosinophil #: 0 x10 3/mm (ref 0.0–0.7)
Eosinophil %: 0.6 %
HCT: 36.4 % (ref 35.0–47.0)
HGB: 11.7 g/dL — ABNORMAL LOW (ref 12.0–16.0)
Lymphocyte #: 1.4 x10 3/mm (ref 1.0–3.6)
Lymphocyte %: 22.5 %
MCH: 29 pg (ref 26.0–34.0)
MCHC: 32.2 g/dL (ref 32.0–36.0)
MCV: 90 fL (ref 80–100)
Monocyte #: 0.8 x10 3/mm (ref 0.2–0.9)
Monocyte %: 12.7 %
NEUTROS PCT: 63 %
Neutrophil #: 3.8 x10 3/mm (ref 1.4–6.5)
PLATELETS: 59 x10 3/mm — AB (ref 150–440)
RBC: 4.04 10*6/uL (ref 3.80–5.20)
RDW: 17.8 % — ABNORMAL HIGH (ref 11.5–14.5)
WBC: 6.1 x10 3/mm (ref 3.6–11.0)

## 2014-07-16 LAB — CBC CANCER CENTER
Basophil #: 0.1 x10 3/mm (ref 0.0–0.1)
Basophil %: 1 %
Eosinophil #: 0 x10 3/mm (ref 0.0–0.7)
Eosinophil %: 0.2 %
HCT: 34.8 % — ABNORMAL LOW (ref 35.0–47.0)
HGB: 11.4 g/dL — AB (ref 12.0–16.0)
LYMPHS ABS: 1 x10 3/mm (ref 1.0–3.6)
LYMPHS PCT: 16.2 %
MCH: 29.5 pg (ref 26.0–34.0)
MCHC: 32.7 g/dL (ref 32.0–36.0)
MCV: 90 fL (ref 80–100)
MONO ABS: 0.3 x10 3/mm (ref 0.2–0.9)
MONOS PCT: 5 %
NEUTROS ABS: 4.7 x10 3/mm (ref 1.4–6.5)
Neutrophil %: 77.6 %
PLATELETS: 55 x10 3/mm — AB (ref 150–440)
RBC: 3.86 10*6/uL (ref 3.80–5.20)
RDW: 18.1 % — ABNORMAL HIGH (ref 11.5–14.5)
WBC: 6 x10 3/mm (ref 3.6–11.0)

## 2014-07-16 LAB — COMPREHENSIVE METABOLIC PANEL
ALBUMIN: 2.9 g/dL — AB (ref 3.4–5.0)
AST: 16 U/L (ref 15–37)
Alkaline Phosphatase: 102 U/L
Anion Gap: 2 — ABNORMAL LOW (ref 7–16)
BUN: 8 mg/dL (ref 7–18)
Bilirubin,Total: 0.5 mg/dL (ref 0.2–1.0)
CHLORIDE: 103 mmol/L (ref 98–107)
Calcium, Total: 8.9 mg/dL (ref 8.5–10.1)
Co2: 34 mmol/L — ABNORMAL HIGH (ref 21–32)
Creatinine: 0.93 mg/dL (ref 0.60–1.30)
EGFR (African American): >60
Glucose: 129 mg/dL — ABNORMAL HIGH (ref 65–99)
OSMOLALITY: 278 (ref 275–301)
POTASSIUM: 4.2 mmol/L (ref 3.5–5.1)
SGPT (ALT): 17 U/L
Sodium: 139 mmol/L (ref 136–145)
Total Protein: 5.8 g/dL — ABNORMAL LOW (ref 6.4–8.2)

## 2014-07-30 LAB — CBC CANCER CENTER
BASOS ABS: 0.1 x10 3/mm (ref 0.0–0.1)
BASOS PCT: 0.8 %
Eosinophil #: 0 x10 3/mm (ref 0.0–0.7)
Eosinophil %: 0.3 %
HCT: 34.8 % — ABNORMAL LOW (ref 35.0–47.0)
HGB: 11.3 g/dL — ABNORMAL LOW (ref 12.0–16.0)
LYMPHS ABS: 1.2 x10 3/mm (ref 1.0–3.6)
LYMPHS PCT: 17.4 %
MCH: 29.2 pg (ref 26.0–34.0)
MCHC: 32.6 g/dL (ref 32.0–36.0)
MCV: 90 fL (ref 80–100)
MONOS PCT: 6.8 %
Monocyte #: 0.5 x10 3/mm (ref 0.2–0.9)
NEUTROS ABS: 5.1 x10 3/mm (ref 1.4–6.5)
NEUTROS PCT: 74.7 %
PLATELETS: 55 x10 3/mm — AB (ref 150–440)
RBC: 3.88 10*6/uL (ref 3.80–5.20)
RDW: 18.1 % — ABNORMAL HIGH (ref 11.5–14.5)
WBC: 6.8 x10 3/mm (ref 3.6–11.0)

## 2014-07-30 LAB — COMPREHENSIVE METABOLIC PANEL
ALK PHOS: 100 U/L
Albumin: 3 g/dL — ABNORMAL LOW (ref 3.4–5.0)
Anion Gap: 7 (ref 7–16)
BUN: 7 mg/dL (ref 7–18)
Bilirubin,Total: 0.5 mg/dL (ref 0.2–1.0)
CALCIUM: 8.9 mg/dL (ref 8.5–10.1)
CREATININE: 0.81 mg/dL (ref 0.60–1.30)
Chloride: 102 mmol/L (ref 98–107)
Co2: 30 mmol/L (ref 21–32)
EGFR (African American): >60
EGFR (Non-African Amer.): >60
GLUCOSE: 134 mg/dL — AB (ref 65–99)
OSMOLALITY: 277 (ref 275–301)
POTASSIUM: 3.5 mmol/L (ref 3.5–5.1)
SGOT(AST): 14 U/L — ABNORMAL LOW (ref 15–37)
SGPT (ALT): 18 U/L
SODIUM: 139 mmol/L (ref 136–145)
TOTAL PROTEIN: 6 g/dL — AB (ref 6.4–8.2)

## 2014-08-10 ENCOUNTER — Ambulatory Visit: Payer: Self-pay | Admitting: Oncology

## 2014-08-11 ENCOUNTER — Encounter: Payer: Self-pay | Admitting: General Surgery

## 2014-08-13 LAB — CBC CANCER CENTER
BASOS PCT: 0.7 %
Basophil #: 0 x10 3/mm (ref 0.0–0.1)
EOS PCT: 0.4 %
Eosinophil #: 0 x10 3/mm (ref 0.0–0.7)
HCT: 35.8 % (ref 35.0–47.0)
HGB: 11.5 g/dL — AB (ref 12.0–16.0)
LYMPHS PCT: 18 %
Lymphocyte #: 1.2 x10 3/mm (ref 1.0–3.6)
MCH: 29.1 pg (ref 26.0–34.0)
MCHC: 32.2 g/dL (ref 32.0–36.0)
MCV: 91 fL (ref 80–100)
Monocyte #: 0.5 x10 3/mm (ref 0.2–0.9)
Monocyte %: 7.3 %
NEUTROS ABS: 4.8 x10 3/mm (ref 1.4–6.5)
Neutrophil %: 73.6 %
Platelet: 60 x10 3/mm — ABNORMAL LOW (ref 150–440)
RBC: 3.95 10*6/uL (ref 3.80–5.20)
RDW: 18.1 % — ABNORMAL HIGH (ref 11.5–14.5)
WBC: 6.5 x10 3/mm (ref 3.6–11.0)

## 2014-08-13 LAB — COMPREHENSIVE METABOLIC PANEL
ALK PHOS: 100 U/L
ALT: 16 U/L
Albumin: 3.1 g/dL — ABNORMAL LOW (ref 3.4–5.0)
Anion Gap: 7 (ref 7–16)
BUN: 8 mg/dL (ref 7–18)
Bilirubin,Total: 0.5 mg/dL (ref 0.2–1.0)
Calcium, Total: 9.2 mg/dL (ref 8.5–10.1)
Chloride: 102 mmol/L (ref 98–107)
Co2: 30 mmol/L (ref 21–32)
Creatinine: 0.86 mg/dL (ref 0.60–1.30)
EGFR (African American): >60
Glucose: 176 mg/dL — ABNORMAL HIGH (ref 65–99)
Osmolality: 280 (ref 275–301)
Potassium: 3.9 mmol/L (ref 3.5–5.1)
SGOT(AST): 13 U/L — ABNORMAL LOW (ref 15–37)
Sodium: 139 mmol/L (ref 136–145)
Total Protein: 6.1 g/dL — ABNORMAL LOW (ref 6.4–8.2)

## 2014-08-14 LAB — CANCER ANTIGEN 27.29: CA 27.29: 35.3 U/mL (ref 0.0–38.6)

## 2014-08-27 LAB — CBC CANCER CENTER
Basophil #: 0.1 "x10 3/mm "
Basophil %: 0.9 %
Eosinophil #: 0.1 "x10 3/mm "
Eosinophil %: 1 %
HCT: 35.4 %
HGB: 11.4 g/dL — ABNORMAL LOW
Lymphocyte %: 14.4 %
Lymphs Abs: 1 "x10 3/mm "
MCH: 29.2 pg
MCHC: 32.3 g/dL
MCV: 90 fL
Monocyte #: 0.5 "x10 3/mm "
Monocyte %: 8 %
Neutrophil #: 5.1 "x10 3/mm "
Neutrophil %: 75.7 %
Platelet: 65 "x10 3/mm " — ABNORMAL LOW
RBC: 3.92 "x10 6/mm "
RDW: 18 % — ABNORMAL HIGH
WBC: 6.7 "x10 3/mm "

## 2014-08-27 LAB — COMPREHENSIVE METABOLIC PANEL WITH GFR
Albumin: 3.2 g/dL — ABNORMAL LOW
Alkaline Phosphatase: 97 U/L
Anion Gap: 6 — ABNORMAL LOW
BUN: 12 mg/dL
Bilirubin,Total: 0.6 mg/dL
Calcium, Total: 9.4 mg/dL
Chloride: 102 mmol/L
Co2: 33 mmol/L — ABNORMAL HIGH
Creatinine: 0.96 mg/dL
EGFR (African American): >60
EGFR (Non-African Amer.): >60
Glucose: 161 mg/dL — ABNORMAL HIGH
Osmolality: 284
Potassium: 4 mmol/L
SGOT(AST): 15 U/L
SGPT (ALT): 16 U/L
Sodium: 141 mmol/L
Total Protein: 6.2 g/dL — ABNORMAL LOW

## 2014-09-09 ENCOUNTER — Ambulatory Visit: Payer: Self-pay | Admitting: Oncology

## 2014-09-10 LAB — CBC CANCER CENTER
BASOS PCT: 0.8 %
Basophil #: 0.1 x10 3/mm (ref 0.0–0.1)
EOS PCT: 0.7 %
Eosinophil #: 0.1 x10 3/mm (ref 0.0–0.7)
HCT: 33.9 % — ABNORMAL LOW (ref 35.0–47.0)
HGB: 11 g/dL — ABNORMAL LOW (ref 12.0–16.0)
LYMPHS ABS: 1 x10 3/mm (ref 1.0–3.6)
Lymphocyte %: 13.6 %
MCH: 29.3 pg (ref 26.0–34.0)
MCHC: 32.5 g/dL (ref 32.0–36.0)
MCV: 90 fL (ref 80–100)
Monocyte #: 0.7 x10 3/mm (ref 0.2–0.9)
Monocyte %: 9 %
Neutrophil #: 5.7 x10 3/mm (ref 1.4–6.5)
Neutrophil %: 75.9 %
Platelet: 53 x10 3/mm — ABNORMAL LOW (ref 150–440)
RBC: 3.76 10*6/uL — AB (ref 3.80–5.20)
RDW: 17.3 % — ABNORMAL HIGH (ref 11.5–14.5)
WBC: 7.5 x10 3/mm (ref 3.6–11.0)

## 2014-09-10 LAB — COMPREHENSIVE METABOLIC PANEL
ALK PHOS: 96 U/L
ANION GAP: 4 — AB (ref 7–16)
AST: 13 U/L — AB (ref 15–37)
Albumin: 3.2 g/dL — ABNORMAL LOW (ref 3.4–5.0)
BUN: 8 mg/dL (ref 7–18)
Bilirubin,Total: 0.5 mg/dL (ref 0.2–1.0)
CREATININE: 0.85 mg/dL (ref 0.60–1.30)
Calcium, Total: 9 mg/dL (ref 8.5–10.1)
Chloride: 100 mmol/L (ref 98–107)
Co2: 35 mmol/L — ABNORMAL HIGH (ref 21–32)
EGFR (Non-African Amer.): >60
GLUCOSE: 141 mg/dL — AB (ref 65–99)
Osmolality: 278 (ref 275–301)
Potassium: 4 mmol/L (ref 3.5–5.1)
SGPT (ALT): 15 U/L
SODIUM: 139 mmol/L (ref 136–145)
TOTAL PROTEIN: 6.1 g/dL — AB (ref 6.4–8.2)

## 2014-09-11 LAB — CANCER ANTIGEN 27.29: CA 27.29: 31.2 U/mL (ref 0.0–38.6)

## 2014-09-24 LAB — COMPREHENSIVE METABOLIC PANEL
ALT: 17 U/L
AST: 10 U/L — AB (ref 15–37)
Albumin: 2.9 g/dL — ABNORMAL LOW (ref 3.4–5.0)
Alkaline Phosphatase: 100 U/L
Anion Gap: 6 — ABNORMAL LOW (ref 7–16)
BUN: 6 mg/dL — AB (ref 7–18)
Bilirubin,Total: 0.4 mg/dL (ref 0.2–1.0)
CALCIUM: 8.4 mg/dL — AB (ref 8.5–10.1)
Chloride: 104 mmol/L (ref 98–107)
Co2: 31 mmol/L (ref 21–32)
Creatinine: 0.9 mg/dL (ref 0.60–1.30)
EGFR (Non-African Amer.): >60
GLUCOSE: 160 mg/dL — AB (ref 65–99)
Osmolality: 282 (ref 275–301)
Potassium: 3.7 mmol/L (ref 3.5–5.1)
SODIUM: 141 mmol/L (ref 136–145)
Total Protein: 6 g/dL — ABNORMAL LOW (ref 6.4–8.2)

## 2014-09-24 LAB — CBC CANCER CENTER
BASOS ABS: 0 x10 3/mm (ref 0.0–0.1)
Basophil %: 0.7 %
Eosinophil #: 0 x10 3/mm (ref 0.0–0.7)
Eosinophil %: 0.4 %
HCT: 35.1 % (ref 35.0–47.0)
HGB: 11.4 g/dL — AB (ref 12.0–16.0)
Lymphocyte #: 1.3 x10 3/mm (ref 1.0–3.6)
Lymphocyte %: 23.2 %
MCH: 29.8 pg (ref 26.0–34.0)
MCHC: 32.6 g/dL (ref 32.0–36.0)
MCV: 91 fL (ref 80–100)
MONO ABS: 0.5 x10 3/mm (ref 0.2–0.9)
MONOS PCT: 8.3 %
Neutrophil #: 3.8 x10 3/mm (ref 1.4–6.5)
Neutrophil %: 67.4 %
Platelet: 63 x10 3/mm — ABNORMAL LOW (ref 150–440)
RBC: 3.84 10*6/uL (ref 3.80–5.20)
RDW: 17.3 % — ABNORMAL HIGH (ref 11.5–14.5)
WBC: 5.6 x10 3/mm (ref 3.6–11.0)

## 2014-09-25 LAB — CANCER ANTIGEN 27.29: CA 27.29: 35.8 U/mL (ref 0.0–38.6)

## 2014-10-10 ENCOUNTER — Ambulatory Visit: Payer: Self-pay | Admitting: Oncology

## 2014-10-15 LAB — COMPREHENSIVE METABOLIC PANEL
ALK PHOS: 92 U/L
ALT: 16 U/L
AST: 12 U/L — AB (ref 15–37)
Albumin: 3.3 g/dL — ABNORMAL LOW (ref 3.4–5.0)
Anion Gap: 6 — ABNORMAL LOW (ref 7–16)
BUN: 9 mg/dL (ref 7–18)
Bilirubin,Total: 0.5 mg/dL (ref 0.2–1.0)
Calcium, Total: 8.9 mg/dL (ref 8.5–10.1)
Chloride: 100 mmol/L (ref 98–107)
Co2: 33 mmol/L — ABNORMAL HIGH (ref 21–32)
Creatinine: 0.86 mg/dL (ref 0.60–1.30)
EGFR (African American): >60
EGFR (Non-African Amer.): >60
Glucose: 224 mg/dL — ABNORMAL HIGH (ref 65–99)
Osmolality: 283 (ref 275–301)
POTASSIUM: 4 mmol/L (ref 3.5–5.1)
Sodium: 139 mmol/L (ref 136–145)
TOTAL PROTEIN: 6.2 g/dL — AB (ref 6.4–8.2)

## 2014-10-15 LAB — CBC CANCER CENTER
BASOS PCT: 0.9 %
Basophil #: 0 x10 3/mm (ref 0.0–0.1)
EOS PCT: 2.7 %
Eosinophil #: 0.1 x10 3/mm (ref 0.0–0.7)
HCT: 36 % (ref 35.0–47.0)
HGB: 11.8 g/dL — AB (ref 12.0–16.0)
LYMPHS ABS: 0.8 x10 3/mm — AB (ref 1.0–3.6)
Lymphocyte %: 15.7 %
MCH: 29.1 pg (ref 26.0–34.0)
MCHC: 32.9 g/dL (ref 32.0–36.0)
MCV: 89 fL (ref 80–100)
MONO ABS: 0.3 x10 3/mm (ref 0.2–0.9)
MONOS PCT: 5.6 %
NEUTROS ABS: 3.7 x10 3/mm (ref 1.4–6.5)
Neutrophil %: 75.1 %
Platelet: 61 x10 3/mm — ABNORMAL LOW (ref 150–440)
RBC: 4.07 10*6/uL (ref 3.80–5.20)
RDW: 17.8 % — ABNORMAL HIGH (ref 11.5–14.5)
WBC: 4.9 x10 3/mm (ref 3.6–11.0)

## 2014-10-29 LAB — CBC CANCER CENTER
BASOS ABS: 0.1 x10 3/mm (ref 0.0–0.1)
Basophil %: 0.9 %
Eosinophil #: 0.1 x10 3/mm (ref 0.0–0.7)
Eosinophil %: 0.9 %
HCT: 38.9 % (ref 35.0–47.0)
HGB: 12.7 g/dL (ref 12.0–16.0)
LYMPHS PCT: 13.6 %
Lymphocyte #: 1.1 x10 3/mm (ref 1.0–3.6)
MCH: 28.7 pg (ref 26.0–34.0)
MCHC: 32.7 g/dL (ref 32.0–36.0)
MCV: 88 fL (ref 80–100)
MONO ABS: 0.5 x10 3/mm (ref 0.2–0.9)
Monocyte %: 5.9 %
NEUTROS PCT: 78.7 %
Neutrophil #: 6.2 x10 3/mm (ref 1.4–6.5)
Platelet: 54 x10 3/mm — ABNORMAL LOW (ref 150–440)
RBC: 4.44 10*6/uL (ref 3.80–5.20)
RDW: 17.9 % — ABNORMAL HIGH (ref 11.5–14.5)
WBC: 7.8 x10 3/mm (ref 3.6–11.0)

## 2014-10-29 LAB — COMPREHENSIVE METABOLIC PANEL
ALBUMIN: 3.4 g/dL (ref 3.4–5.0)
ALK PHOS: 103 U/L
ALT: 17 U/L
Anion Gap: 6 — ABNORMAL LOW (ref 7–16)
BILIRUBIN TOTAL: 0.6 mg/dL (ref 0.2–1.0)
BUN: 10 mg/dL (ref 7–18)
CREATININE: 0.82 mg/dL (ref 0.60–1.30)
Calcium, Total: 8.5 mg/dL (ref 8.5–10.1)
Chloride: 100 mmol/L (ref 98–107)
Co2: 32 mmol/L (ref 21–32)
EGFR (African American): >60
GLUCOSE: 164 mg/dL — AB (ref 65–99)
OSMOLALITY: 278 (ref 275–301)
Potassium: 3.9 mmol/L (ref 3.5–5.1)
SGOT(AST): 11 U/L — ABNORMAL LOW (ref 15–37)
SODIUM: 138 mmol/L (ref 136–145)
Total Protein: 6.4 g/dL (ref 6.4–8.2)

## 2014-10-30 LAB — CANCER ANTIGEN 27.29: CA 27.29: 32 U/mL (ref 0.0–38.6)

## 2014-11-10 ENCOUNTER — Ambulatory Visit: Payer: Self-pay | Admitting: Oncology

## 2014-11-12 LAB — COMPREHENSIVE METABOLIC PANEL
ALK PHOS: 104 U/L (ref 46–116)
AST: 11 U/L — AB (ref 15–37)
Albumin: 3.3 g/dL — ABNORMAL LOW (ref 3.4–5.0)
Anion Gap: 7 (ref 7–16)
BUN: 10 mg/dL (ref 7–18)
Bilirubin,Total: 0.4 mg/dL (ref 0.2–1.0)
CALCIUM: 8.6 mg/dL (ref 8.5–10.1)
Chloride: 103 mmol/L (ref 98–107)
Co2: 30 mmol/L (ref 21–32)
Creatinine: 0.91 mg/dL (ref 0.60–1.30)
EGFR (African American): >60
EGFR (Non-African Amer.): >60
GLUCOSE: 197 mg/dL — AB (ref 65–99)
Osmolality: 284 (ref 275–301)
POTASSIUM: 4 mmol/L (ref 3.5–5.1)
SGPT (ALT): 18 U/L (ref 14–63)
SODIUM: 140 mmol/L (ref 136–145)
TOTAL PROTEIN: 6.2 g/dL — AB (ref 6.4–8.2)

## 2014-11-12 LAB — CBC CANCER CENTER
Basophil #: 0 x10 3/mm (ref 0.0–0.1)
Basophil %: 0.7 %
EOS PCT: 0.8 %
Eosinophil #: 0 x10 3/mm (ref 0.0–0.7)
HCT: 37.2 % (ref 35.0–47.0)
HGB: 12.4 g/dL (ref 12.0–16.0)
LYMPHS ABS: 1.2 x10 3/mm (ref 1.0–3.6)
LYMPHS PCT: 22.2 %
MCH: 29 pg (ref 26.0–34.0)
MCHC: 33.3 g/dL (ref 32.0–36.0)
MCV: 87 fL (ref 80–100)
Monocyte #: 0.4 x10 3/mm (ref 0.2–0.9)
Monocyte %: 7.1 %
NEUTROS ABS: 3.9 x10 3/mm (ref 1.4–6.5)
Neutrophil %: 69.2 %
Platelet: 58 x10 3/mm — ABNORMAL LOW (ref 150–440)
RBC: 4.28 10*6/uL (ref 3.80–5.20)
RDW: 16.7 % — ABNORMAL HIGH (ref 11.5–14.5)
WBC: 5.6 x10 3/mm (ref 3.6–11.0)

## 2014-11-12 LAB — MAGNESIUM: Magnesium: 1.6 mg/dL — ABNORMAL LOW

## 2014-12-09 ENCOUNTER — Ambulatory Visit
Admit: 2014-12-09 | Disposition: A | Discharge status: Home / Self Care | Payer: Self-pay | Attending: Oncology | Admitting: Oncology

## 2015-01-07 LAB — CBC CANCER CENTER
BASOS PCT: 0.8 %
Basophil #: 0.1 x10 3/mm (ref 0.0–0.1)
EOS ABS: 0.1 x10 3/mm (ref 0.0–0.7)
EOS PCT: 0.9 %
HCT: 40.7 % (ref 35.0–47.0)
HGB: 13.7 g/dL (ref 12.0–16.0)
LYMPHS ABS: 1.4 x10 3/mm (ref 1.0–3.6)
Lymphocyte %: 15.4 %
MCH: 29.1 pg (ref 26.0–34.0)
MCHC: 33.7 g/dL (ref 32.0–36.0)
MCV: 86 fL (ref 80–100)
MONO ABS: 0.6 x10 3/mm (ref 0.2–0.9)
Monocyte %: 6.1 %
NEUTROS ABS: 7 x10 3/mm — AB (ref 1.4–6.5)
Neutrophil %: 76.8 %
Platelet: 67 x10 3/mm — ABNORMAL LOW (ref 150–440)
RBC: 4.72 10*6/uL (ref 3.80–5.20)
RDW: 17.6 % — ABNORMAL HIGH (ref 11.5–14.5)
WBC: 9.1 x10 3/mm (ref 3.6–11.0)

## 2015-01-07 LAB — COMPREHENSIVE METABOLIC PANEL
ALK PHOS: 92 U/L
ALT: 15 U/L
Albumin: 4.2 g/dL
Anion Gap: 6 — ABNORMAL LOW (ref 7–16)
BILIRUBIN TOTAL: 1 mg/dL
BUN: 11 mg/dL
CHLORIDE: 97 mmol/L — AB
Calcium, Total: 9.1 mg/dL
Co2: 29 mmol/L
Creatinine: 0.79 mg/dL
EGFR (African American): >60
GLUCOSE: 271 mg/dL — AB
Potassium: 3.6 mmol/L
SGOT(AST): 18 U/L
SODIUM: 132 mmol/L — AB
Total Protein: 6.7 g/dL

## 2015-01-08 LAB — CANCER ANTIGEN 27.29: CA 27.29: 39.9 U/mL — ABNORMAL HIGH (ref 0.0–38.6)

## 2015-01-09 ENCOUNTER — Ambulatory Visit
Admit: 2015-01-09 | Disposition: A | Discharge status: Home / Self Care | Payer: Self-pay | Attending: Oncology | Admitting: Oncology

## 2015-01-21 LAB — CBC CANCER CENTER
Basophil #: 0.1 x10 3/mm (ref 0.0–0.1)
Basophil %: 1.3 %
EOS ABS: 0.1 x10 3/mm (ref 0.0–0.7)
EOS PCT: 0.9 %
HCT: 41.1 % (ref 35.0–47.0)
HGB: 13.5 g/dL (ref 12.0–16.0)
LYMPHS PCT: 12.4 %
Lymphocyte #: 1.1 x10 3/mm (ref 1.0–3.6)
MCH: 28.7 pg (ref 26.0–34.0)
MCHC: 33 g/dL (ref 32.0–36.0)
MCV: 87 fL (ref 80–100)
MONOS PCT: 5.2 %
Monocyte #: 0.5 x10 3/mm (ref 0.2–0.9)
NEUTROS ABS: 7.1 x10 3/mm — AB (ref 1.4–6.5)
NEUTROS PCT: 80.2 %
Platelet: 62 x10 3/mm — ABNORMAL LOW (ref 150–440)
RBC: 4.71 10*6/uL (ref 3.80–5.20)
RDW: 18.2 % — ABNORMAL HIGH (ref 11.5–14.5)
WBC: 8.9 x10 3/mm (ref 3.6–11.0)

## 2015-01-21 LAB — COMPREHENSIVE METABOLIC PANEL
Albumin: 3.7 g/dL
Alkaline Phosphatase: 82 U/L
Anion Gap: 6 — ABNORMAL LOW (ref 7–16)
BUN: 15 mg/dL
Bilirubin,Total: 0.9 mg/dL
CALCIUM: 9.3 mg/dL
CO2: 28 mmol/L
Chloride: 100 mmol/L — ABNORMAL LOW
Creatinine: 0.76 mg/dL
EGFR (African American): >60
Glucose: 186 mg/dL — ABNORMAL HIGH
POTASSIUM: 3.8 mmol/L
SGOT(AST): 19 U/L
SGPT (ALT): 16 U/L
Sodium: 134 mmol/L — ABNORMAL LOW
Total Protein: 6.5 g/dL

## 2015-01-30 NOTE — Op Note (Signed)
PATIENT NAME:  Paula Price, Paula Price MR#:  [id number] DATE OF BIRTH:  02/01/1961  DATE OF PROCEDURE:  08/29/2013  PREOPERATIVE DIAGNOSIS: Metastatic breast cancer.   POSTOPERATIVE DIAGNOSIS: Metastatic breast cancer.  OPERATIVE PROCEDURE: Placement of right subclavian PowerPort with ultrasound and fluoroscopic guidance.   SURGEON: Robert Bellow, MD  ANESTHESIA: Attended local, 10 mL of 1% plain Xylocaine.   ESTIMATED BLOOD LOSS: Minimal.   CLINICAL NOTE: This 54 year old woman has had recurrent breast cancer with extensive hepatic and pulmonary metastasis develop. Central venous access has been requested by her treating oncologist. The patient received Kefzol prior to the procedure.   OPERATIVE NOTE: With the patient under adequate sedation, the right chest and neck was prepped with ChloraPrep and draped. Ultrasound was used to confirm patency of the right subclavian vein. This was cannulated on a single stick under ultrasound guidance. A guidewire was advanced under fluoroscopy, followed by the dilator and the catheter. This was positioned at the junction of the SVC and right atrium. It easily irrigated and aspirated in this position. It was tunneled to a pocket on the right chest and attached to the previously flushed PowerPort. The port was anchored to the deep tissue with 3-point fixation of 3-0 Prolene. The skin defect was closed with a running 3-0 Vicryl to the adipose layer and a running 4-0 Vicryl subcuticular suture for the skin. Final aspiration was completed, as well as irrigation with 10 mL of injectable saline. Benzoin and Steri-Strips were applied followed by a Telfa and Tegaderm dressing. The patient tolerated the procedure well. Erect portable chest x-ray in the recovery room showed no evidence of pneumothorax and the catheter tip as described above.    ____________________________ Robert Bellow, MD jwb:jcm D: 08/30/2013 14:38:49 ET T: 08/30/2013 15:29:07  ET JOB#: [id number]  cc: Robert Bellow, MD, <Dictator> Richard L. Rosanna Randy, MD Martie Lee. Oliva Bustard, MD Carvin Almas Amedeo Kinsman MD ELECTRONICALLY SIGNED 09/11/2013 8:15

## 2015-01-31 NOTE — Consult Note (Signed)
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   Patient was seen by consultant.   Discussed with Attending MD.   Comments: PATIENT SEEN AND EVALUATED EARLY EVENING 6/12. [place]. S/P CHEMOTX WITH ABRAXANE 5/13 AND 5/27, WITH NEULASTA THE NEXT DAY.  1. PLTS RANGING 55 TO 80K, HGB RANGING 9.5 TO 10.5, DUE TO PRIOR AND RECENT CHEMOTX TX AND REDUCED M RESERVE. 2. SPLENOMEGALY CONTIBUTING TO CYTOPENIA  3.LIVER DYSFUNCTION, LOW ALBUMN, CIRRHOTIC LIVER, PROLONGED PT . 4, TODAYS COUNTS MINIMALLY LOWER, CONSISTENT WITH RECENT TX, HYDRATION, POSSIBLE BLOOD LOSS, POSSIBLE CONSUMPTION.Marland KitchenPLAN DISCUSSED PRE AND PERIOPERATIVE TRANSFUSION, TO ACHIEVE PLT COUNT THRESHOLD FOR SURGERY AS PER DR MENZ. ALSO PRBC PRN. IRRADIATE ALL PRODUCTS. POST OP, USUAL DVT PROPHYLAXIS FOR HGH RISK PATIENT DISCUSSED  USING LOVENOX, DOSE AND SCHEDULE AS PER ORTHOPEDICS. PROLONGED PT WILL INCREASE REISK OF BLEED, BUT UNLIKELY TO GIVE ANY SIGNIFICANT CLOT PROTECTION.POST OP ALSO WILL WANT TO MAINTAIN PLTS ABOVE 50K TO REDUCE RISK OF BLEEDING  DURING LOVENOX. PLT TRANSFUSION NOT NEEDED ACUTELY AS NO BLEEDING .WOULD ALSO RECHECK PROTIME, IN CASE FFP MAY BE NEEDED.Marland Kitchen  Electronic Signatures: Dallas Schimke (MD)  (Signed 13-Jun-15 00:35)  Authored: Brief Consult Note   Last Updated: 13-Jun-15 00:35 by Dallas Schimke (MD)

## 2015-01-31 NOTE — Consult Note (Signed)
Chief Complaint:  Subjective/Chief Complaint post surgical pain,   VITAL SIGNS/ANCILLARY NOTES: **Vital Signs.:   13-Jun-15 11:25  Vital Signs Type Post-Procedure  Temperature Temperature (F) 97.8  Celsius 36.5  Temperature Source oral  Pulse Pulse 98  Respirations Respirations 19  Systolic BP Systolic BP 503  Diastolic BP (mmHg) Diastolic BP (mmHg) 78  Mean BP 90  Pulse Ox % Pulse Ox % 99  Pulse Ox Activity Level  At rest  Oxygen Delivery Room Air/ 21 %   Brief Assessment:  Cardiac Regular   Respiratory normal resp effort   Gastrointestinal details normal Nontender   Additional Physical Exam lethargic, alert, cooperative, foley with blood   Lab Results: Routine Chem:  13-Jun-15 03:20   Result Comment labs - This specimen was collected through an   - indwelling catheter or arterial line.  - A minimum of 31ms of blood was wasted prior    - to collecting the sample.  Interpret  - results with caution.  Result(s) reported on 22 Mar 2014 at 04:13AM.  Creatinine (comp) 0.83  eGFR (African American) >60  eGFR (Non-African American) >60 (eGFR values <6454mmin/1.73 m2 may be an indication of chronic kidney disease (CKD). Calculated eGFR is useful in patients with stable renal function. The eGFR calculation will not be reliable in acutely ill patients when serum creatinine is changing rapidly. It is not useful in  patients on dialysis. The eGFR calculation may not be applicable to patients at the low and high extremes of body sizes, pregnant women, and vegetarians.)    03:28   Result Comment labs - This specimen was collected through an   - indwelling catheter or arterial line.  - A minimum of 54m39mof blood was wasted prior    - to collecting the sample.  Interpret  - results with caution.  Result(s) reported on 22 Mar 2014 at 03:43AM.  Routine Coag:  13-Jun-15 03:20   Prothrombin  16.6  INR 1.4 (INR reference interval applies to patients on anticoagulant therapy. A  single INR therapeutic range for coumarins is not optimal for all indications; however, the suggested range for most indications is 2.0 - 3.0. Exceptions to the INR Reference Range may include: Prosthetic heart valves, acute myocardial infarction, prevention of myocardial infarction, and combinations of aspirin and anticoagulant. The need for a higher or lower target INR must be assessed individually. Reference: The Pharmacology and Management of the Vitamin K  antagonists: the seventh ACCP Conference on Antithrombotic and Thrombolytic Therapy. CheTWSFK.8127pt:126 (3suppl): [id number] HCT value >55% may artifactually increase the PT.  In one study,  the increase was an average of 25%. Reference:  "Effect on Routine and Special Coagulation Testing Values of Citrate Anticoagulant Adjustment in Patients with High HCT Values." American Journal of Clinical Pathology 2006;126:400-405.)  Routine Hem:  13-Jun-15 03:28   WBC (CBC)  11.4  RBC (CBC)  3.06  Hemoglobin (CBC)  8.8  Hematocrit (CBC)  27.2  Platelet Count (CBC)  38  MCV 89  MCH 28.7  MCHC 32.3  RDW  24.0  Neutrophil % 81.6  Lymphocyte % 11.7  Monocyte % 5.8  Eosinophil % 0.2  Basophil % 0.7  Neutrophil #  9.3  Lymphocyte # 1.3  Monocyte # 0.7  Eosinophil # 0.0  Basophil # 0.1   Radiology Results: XRay:    13-Jun-15 09:48, Hip Left One View  Hip Left One View   REASON FOR EXAM:    Fracture reduction/fixation  COMMENTS:  PROCEDURE: DXR - DXR HIP LEFT ONE VIEW  - Mar 22 2014  9:48AM     CLINICAL DATA:  Fracture reduction    EXAM:  DG C-ARM 61-120 MIN; LEFT HIP - 1 VIEW:    TECHNIQUE:  Three views of the left hip/ femur submitted    CONTRAST:  None  FLUOROSCOPY TIME:  1 min 52 seconds    COMPARISON:  03/20/2014    FINDINGS:  The patient is status post intertrochanteric left femoral fracture  repair. There is a metallic pin and intra medullary rod is noted in  left femur. There is anatomic alignment.      IMPRESSION:  Status post intraoperative repair of left proximal femur fracture  with metallic pin and intra medullary rod in anatomic alignment. Is      Electronically Signed    By: Lahoma Crocker M.D.    On: 03/22/2014 10:34         Verified By: Ephraim Hamburger, M.D.,   Assessment/Plan:  Assessment/Plan:  Assessment metastatic breast cancer, to liver, recently salvage tx abraxane,with neulasta support, last tx  5/27, prior heavily pretrated, including bm transplant in 1996. also cirrhosis/pseudocirrhosis,  recent ascistes, hypoalbumenemia, mild prolionged pt, , splenomegaly. plts usually range 55 to 95 approx, hgb has been 9.5 to 10.5 approx, now lower at 38 k, last night and this am pre op. Now s/p surgery, also reported that 2 pheresis units plts given, pre and post op. Another problem is gross hematuria, as per patient has seen  at home for about a month   Plan f/u cbc, transfuse prn, irradiated products. Unless significant gu or other bleed, want to keep prophylactic lovenox   Electronic Signatures: Dallas Schimke (MD)  (Signed 13-Jun-15 12:40)  Authored: Chief Complaint, VITAL SIGNS/ANCILLARY NOTES, Brief Assessment, Lab Results, Radiology Results, Assessment/Plan   Last Updated: 13-Jun-15 12:40 by Dallas Schimke (MD)

## 2015-01-31 NOTE — Consult Note (Signed)
Chief Complaint:  Subjective/Chief Complaint post surgical pain,   VITAL SIGNS/ANCILLARY NOTES: **Vital Signs.:   14-Jun-15 04:00  Vital Signs Type Routine  Temperature Temperature (F) 97.8  Celsius 36.5  Temperature Source oral  Respirations Respirations 20  Systolic BP Systolic BP 99  Diastolic BP (mmHg) Diastolic BP (mmHg) 67  Mean BP 77  Pulse Ox % Pulse Ox % 99  Pulse Ox Activity Level  At rest  Oxygen Delivery Room Air/ 21 %   Brief Assessment:  Cardiac Regular   Respiratory normal resp effort  cklear, decreased air entry   Gastrointestinal details normal Nontender   Additional Physical Exam lesslethargic, more alert  alert, cooperative, foley with only blood tinged, hematuria is better   Lab Results: Routine Chem:  14-Jun-15 05:17   Result Comment LABS - This specimen was collected through an   - indwelling catheter or arterial line.  - A minimum of 48ms of blood was wasted prior    - to collecting the sample.  Interpret  - results with caution.  Result(s) reported on 23 Mar 2014 at 05:59AM.  Glucose, Serum  100  BUN 7  Creatinine (comp) 0.67  Sodium, Serum 137  Potassium, Serum 4.1  Chloride, Serum 104  CO2, Serum 28  Calcium (Total), Serum  8.1  Anion Gap  5  Osmolality (calc) 272  eGFR (African American) >60  eGFR (Non-African American) >60 (eGFR values <681mmin/1.73 m2 may be an indication of chronic kidney disease (CKD). Calculated eGFR is useful in patients with stable renal function. The eGFR calculation will not be reliable in acutely ill patients when serum creatinine is changing rapidly. It is not useful in  patients on dialysis. The eGFR calculation may not be applicable to patients at the low and high extremes of body sizes, pregnant women, and vegetarians.)  Routine Hem:  14-Jun-15 05:17   WBC (CBC) 10.6  RBC (CBC)  2.34  Hemoglobin (CBC)  6.8  Hematocrit (CBC)  20.7  Platelet Count (CBC)  61  MCV 88  MCH 29.2  MCHC 33.0  RDW   24.0  Neutrophil % 83.0  Lymphocyte % 9.5  Monocyte % 6.8  Eosinophil % 0.3  Basophil % 0.4  Neutrophil #  8.8  Lymphocyte # 1.0  Monocyte # 0.7  Eosinophil # 0.0  Basophil # 0.0   Assessment/Plan:  Assessment/Plan:  Assessment metastatic breast cancer, to liver, recently salvage tx abraxane,with neulasta support, last tx  5/27, prior heavily pretrated, including bm transplant in 1996. also cirrhosis/pseudocirrhosis,  recent ascistes, hypoalbumenemia, mild prolionged pt, , splenomegaly. plts usually range 55 to 95 approx, hgb has been 9.5 to 10.5 approx, on 6/12 and 6/13 was lower at 38 k,. Now s/p surgery, in am 6/13 also reported that 2 pheresis units plts given, pre and post op.. later , afternoon 6/13, plts 68k... Another problem is gross hematuria, as per patient has seen  at home for about a month,  TODAY, hematuria better, urology has seen, plts holding at 64, no sign acute bleed, hgb has dropped slightly from yesterday and significantly from pre op   Plan f/u cbc, transfuse prn, irradiated products. Unless significant gu or other bleed, want to keep prophylactic lovenox. Discussed with Dr VaAnselm Junglingconsidering hgb 6.8, impaired marrow function, mild tachycardia, some general weakness, and postential that hematuria could recurr on lovenox, reasonable to transfuse, initially 1 unit prbc and follow up   Electronic Signatures: GiDallas SchimkeMD)  (Signed 14-Jun-15 11:47)  Authored: Chief Complaint, VITAL  SIGNS/ANCILLARY NOTES, Brief Assessment, Lab Results, Assessment/Plan   Last Updated: 14-Jun-15 11:47 by Dallas Schimke (MD)

## 2015-01-31 NOTE — Discharge Summary (Signed)
Dates of Admission and Diagnosis:  Date of Admission 18-Mar-2014   Date of Discharge 31-Mar-2014   Admitting Diagnosis Pain in hip   Final Diagnosis Intertrochanteric fracture- s/p sx Anemia of blood loss- hematuria and sx- stable between 7-8- received 3 transfusions. Thrombocytopenia- stable around 50-70 Metastatic breast cancer- follow with oncology. Hematuria due to lovenox subcutaneous use- stopped.    Chief Complaint/History of Present Illness A 54-year-old Caucasian female with past medical history of stage IV breast cancer with known lung and liver metastases, presenting after a mechanical fall. Acute onset mechanical fall with associated left hip pain. Describes this as sharp, 10 out of 10 in intensity. Worse with movements. No relieving factors. Nonradiating. No further symptomatology.   Allergies:  Codeine: Rash  Latex: Rash  Shellfish: N/V/Diarrhea, Swelling  Hepatic:  09-Jun-15 17:50   Bilirubin, Total 1.0  Alkaline Phosphatase  122 (45-117 NOTE: New Reference Range 08/30/13)  SGPT (ALT) 14  SGOT (AST) 26  Total Protein, Serum  5.7  Albumin, Serum  2.9  Routine Micro:  11-Jun-15 10:52   Micro Text Report URINE CULTURE   ORGANISM 1                >100,000 CFU/ML STREPTOCOCCUS AGALACTIAE(GROUP B)   COMMENT                   WITH MIXED-BACTERIAL-ORGANISMS   ANTIBIOTIC                       Specimen Source CLEAN CATCH  Culture Comment WITH MIXED-BACTERIAL-ORGANISMS  Organism Name STREPTOCOCCUS AGALACTIAE(GROUP B)  Organism Quantity >100,000 CFU/ML  Organism 1 >100,000 CFU/ML STREPTOCOCCUS AGALACTIAE(GROUP B)  13-Jun-15 12:41   Micro Text Report URINE CULTURE   COMMENT                   NO GROWTH IN 36 HOURS   ANTIBIOTIC                       Specimen Source INDWELLING CATHETER  Culture Comment NO GROWTH IN 36 HOURS  Result(s) reported on 24 Mar 2014 at 10:31AM.  Routine Chem:  09-Jun-15 17:50   Result Comment PT - DUPLICATE SEE [id number]  Result(s)  reported on 18 Mar 2014 at 06:52PM.  Glucose, Serum  160  BUN 11  Creatinine (comp) 1.23  Sodium, Serum  133  Potassium, Serum  3.1  Chloride, Serum  96  CO2, Serum 25  Calcium (Total), Serum 9.1  Anion Gap 12  Osmolality (calc) 269  eGFR (African American)  58  eGFR (Non-African American)  50 (eGFR values <60mL/min/1.73 m2 may be an indication of chronic kidney disease (CKD). Calculated eGFR is useful in patients with stable renal function. The eGFR calculation will not be reliable in acutely ill patients when serum creatinine is changing rapidly. It is not useful in  patients on dialysis. The eGFR calculation may not be applicable to patients at the low and high extremes of body sizes, pregnant women, and vegetarians.)  11-Jun-15 10:52   Result Comment GROUP B STREP - VIRTUALLY 100% OF S.AGALACTIAE (GROUP B)  - STRAINS ARE SUSCEPTIBLE TO PENICILLIN. FOR  - PENICILLIN-ALLERGIC PATIENTS, ERYTHROMYCIN  - (85-94% SENSITIVE) AND CLINDAMYCIN (80%  - SENSITIVE) ARE DRUGS OF CHOICE. CONTACT  - MICROBIOLOGY LAB TO REQUEST SENSITIVITIES  - IF NEEDED WITHIN 7 DAYS.  Result(s) reported on 22 Mar 2014 at 12:55PM.  13-Jun-15 03:28   Result Comment labs - This   specimen was collected through an   - indwelling catheter or arterial line.  - A minimum of 5mls of blood was wasted prior    - to collecting the sample.  Interpret  - results with caution.  Result(s) reported on 22 Mar 2014 at 03:43AM.    12:41   Result Comment UA DIPSTICK - Unable to obtain valid dipstick results  - due to interference of gross blood in the  - specimen.  Result(s) reported on 22 Mar 2014 at 01:11PM.  15-Jun-15 04:32   Result Comment HEMOGLOBIN/HEMATOCRIT - NOTIFIED OF CRITICAL VALUE  - RESULTS VERIFIED BY REPEAT TESTING.  - CALLED TO CARMEN HERRERA AT 0518 ON  - 03/24/14...TPL  - READ-BACK PROCESS PERFORMED.  Result(s) reported on 24 Mar 2014 at 05:19AM.  21-Jun-15 05:29   Result Comment LABS - This  specimen was collected through an   - indwelling catheter or arterial line.  - A minimum of 5mls of blood was wasted prior    - to collecting the sample.  Interpret  - results with caution.  Result(s) reported on 30 Mar 2014 at 06:23AM.  Glucose, Serum 94  BUN  5  Creatinine (comp) 0.64  Sodium, Serum 138  Potassium, Serum 4.0  Chloride, Serum 102  CO2, Serum 31  Calcium (Total), Serum 8.6  Anion Gap  5  Osmolality (calc) 273  eGFR (African American) >60  eGFR (Non-African American) >60 (eGFR values <60mL/min/1.73 m2 may be an indication of chronic kidney disease (CKD). Calculated eGFR is useful in patients with stable renal function. The eGFR calculation will not be reliable in acutely ill patients when serum creatinine is changing rapidly. It is not useful in  patients on dialysis. The eGFR calculation may not be applicable to patients at the low and high extremes of body sizes, pregnant women, and vegetarians.)  22-Jun-15 02:56   Result Comment LABS - This specimen was collected through an   - indwelling catheter or arterial line.  - A minimum of 5mls of blood was wasted prior    - to collecting the sample.  Interpret  - results with caution.  Result(s) reported on 31 Mar 2014 at 03:34AM.  Routine UA:  11-Jun-15 10:52   Color (UA) RED  Clarity (UA) CLOUDY  Glucose (UA) see comment  Bilirubin (UA) see comment  Ketones (UA) see comment  Specific Gravity (UA) 1.010  Blood (UA) see comment  pH (UA) see comment  Protein (UA) see comment  Nitrite (UA) SEE COMMENT  Leukocyte Esterase (UA) see comment  RBC (UA) 1188 /HPF  WBC (UA) 152 /HPF  Bacteria (UA) 3+  Epithelial Cells (UA) 13 /HPF  Transitional Epithelial (UA) 1 /HPF  Amorphous Crystal (UA) PRESENT (Result(s) reported on 20 Mar 2014 at 11:29AM.)  13-Jun-15 12:41   Color (UA) RED  Clarity (UA) CLOUDY  Glucose (UA) see comment  Bilirubin (UA) see comment  Ketones (UA) see comment  Specific Gravity (UA) 1.018   Blood (UA) see comment  pH (UA) see comment  Protein (UA) see comment  Nitrite (UA) SEE COMMENT  Leukocyte Esterase (UA) see comment  RBC (UA) 18558 /HPF  WBC (UA) 474 /HPF  Bacteria (UA) 1+  Epithelial Cells (UA) NONE SEEN  Budding Yeast (UA) PRESENT (Result(s) reported on 22 Mar 2014 at 01:11PM.)  Routine Coag:  09-Jun-15 17:50   Prothrombin -  INR - (INR reference interval applies to patients on anticoagulant therapy. A single INR therapeutic range for coumarins is not optimal for all indications;   however, the suggested range for most indications is 2.0 - 3.0. Exceptions to the INR Reference Range may include: Prosthetic heart valves, acute myocardial infarction, prevention of myocardial infarction, and combinations of aspirin and anticoagulant. The need for a higher or lower target INR must be assessed individually. Reference: The Pharmacology and Management of the Vitamin K  antagonists: the seventh ACCP Conference on Antithrombotic and Thrombolytic Therapy. Chest.2004 Sept:126 (3suppl): 2045-2335. A HCT value >55% may artifactually increase the PT.  In one study,  the increase was an average of 25%. Reference:  "Effect on Routine and Special Coagulation Testing Values of Citrate Anticoagulant Adjustment in Patients with High HCT Values." American Journal of Clinical Pathology 2006;126:400-405.)  Routine Hem:  09-Jun-15 17:50   WBC (CBC)  24.8  RBC (CBC) 3.91  Hemoglobin (CBC)  10.8  Hematocrit (CBC)  33.7  Platelet Count (CBC)  103 (Result(s) reported on 18 Mar 2014 at 06:25PM.)  MCV 86  MCH 27.8  MCHC 32.2  RDW  23.2  10-Jun-15 12:17   Hemoglobin (CBC)  9.8  Platelet Count (CBC)  66  11-Jun-15 05:56   Hemoglobin (CBC)  9.2  Platelet Count (CBC)  52  12-Jun-15 05:23   Hemoglobin (CBC)  9.1  Platelet Count (CBC)  38  13-Jun-15 03:28   WBC (CBC)  11.4  RBC (CBC)  3.06  Hemoglobin (CBC)  8.8  Hematocrit (CBC)  27.2  Platelet Count (CBC)  38  MCV 89  MCH  28.7  MCHC 32.3  RDW  24.0  Neutrophil % 81.6  Lymphocyte % 11.7  Monocyte % 5.8  Eosinophil % 0.2  Basophil % 0.7  Neutrophil #  9.3  Lymphocyte # 1.3  Monocyte # 0.7  Eosinophil # 0.0  Basophil # 0.1    13:05   Hemoglobin (CBC)  7.2  Platelet Count (CBC)  68    20:20   Hemoglobin (CBC)  6.8 (Result(s) reported on 22 Mar 2014 at 08:38PM.)  14-Jun-15 05:17   Hemoglobin (CBC)  6.8  Platelet Count (CBC)  61  15-Jun-15 04:32   WBC (CBC) 7.3  RBC (CBC)  1.67  Hemoglobin (CBC)  5.0  Hematocrit (CBC)  15.0  Platelet Count (CBC)  36  MCV 90  MCH 30.0  MCHC 33.4  RDW  21.5  Neutrophil % 82.1  Lymphocyte % 10.1  Monocyte % 7.1  Eosinophil % 0.3  Basophil % 0.4  Neutrophil # 6.0  Lymphocyte #  0.7  Monocyte # 0.5  Eosinophil # 0.0  Basophil # 0.0    21:25   Hemoglobin (CBC)  7.6  Platelet Count (CBC)  63  16-Jun-15 00:29   Hemoglobin (CBC)  7.3  Platelet Count (CBC)  58  17-Jun-15 06:25   Hemoglobin (CBC)  8.0  Platelet Count (CBC)  57  18-Jun-15 06:57   Hemoglobin (CBC)  7.4  Platelet Count (CBC)  57  19-Jun-15 06:55   Hemoglobin (CBC)  7.7  Platelet Count (CBC)  66  20-Jun-15 05:20   Hemoglobin (CBC)  7.7  Platelet Count (CBC)  69 (Result(s) reported on 29 Mar 2014 at 06:35AM.)  21-Jun-15 05:29   WBC (CBC) 6.6  RBC (CBC)  2.93  Hemoglobin (CBC)  8.9  Hematocrit (CBC)  27.1  Platelet Count (CBC)  92  MCV 92  MCH 30.5  MCHC 33.0  RDW  21.2  Neutrophil % 64.4  Lymphocyte % 23.5  Monocyte % 8.5  Eosinophil % 2.7  Basophil % 0.9  Neutrophil # 4.2    Lymphocyte # 1.5  Monocyte # 0.6  Eosinophil # 0.2  Basophil # 0.1  22-Jun-15 02:56   WBC (CBC) 5.6  RBC (CBC)  2.65  Hemoglobin (CBC)  7.8  Hematocrit (CBC)  24.4  Platelet Count (CBC)  79  MCV 92  MCH 29.3  MCHC  31.8  RDW  20.6  Neutrophil % 57.4  Lymphocyte % 27.1  Monocyte % 9.7  Eosinophil % 5.0  Basophil % 0.8  Neutrophil # 3.2  Lymphocyte # 1.5  Monocyte # 0.5  Eosinophil # 0.3   Basophil # 0.0   PERTINENT RADIOLOGY STUDIES: XRay:    09-Jun-15 19:32, Femur Left  Femur Left   REASON FOR EXAM:    fall / pain  COMMENTS:       PROCEDURE: DXR - DXR FEMUR LEFT  - Mar 18 2014  7:32PM     CLINICAL DATA:  Left leg pain, fall    EXAM:  LEFT FEMUR - 2 VIEW    COMPARISON:  03/18/2014    FINDINGS:  Subtle irregular lucencies through the proximal left femur greater  trochanter compatible with a nondisplaced fracture. Fracture does  not appear to extend into the femoral neck. No malalignment. Distal  femur intact. 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     IMPRESSION:  Findings suspicious for an acute nondisplaced left hip greater  trochanter fracture.      Electronically Signed    By: Trevor  Shick M.D.    On: 03/18/2014 19:57         Verified By: MICHAEL T. SHICK, M.D.,    09-Jun-15 19:32, Hip Left Complete  Hip Left Complete   REASON FOR EXAM:    fall  COMMENTS:       PROCEDURE: DXR - DXR HIP LEFT COMPLETE  - Mar 18 2014  7:32PM     CLINICAL DATA:  Left hip pain.  Fall.    EXAM:  LEFT HIP - COMPLETE 2+ VIEW    COMPARISON:  Femur reported separately.    FINDINGS:  There is no hip fracture or pelvic fracture. There is a  non-displaced greater trochanter fracture.   IMPRESSION:  Nondisplaced greater trochanter fracture.  No hip fracture.      Electronically Signed    By: John  Curnes M.D.    On: 03/18/2014 19:54         Verified By: JOHN T. CURNES, M.D.,  LabUnknown:    09-Jun-15 19:32, Femur Left  PACS Image     09-Jun-15 19:32, Hip Left Complete  PACS Image     11-Jun-15 18:44, MRI Femur Left WWO  PACS Image     16-Jun-15 16:36, CT Abdomen and Pelvis W/WO Contrast  PACS Image   MRI:    11-Jun-15 18:44, MRI Femur Left WWO  MRI Femur Left WWO   REASON FOR EXAM:    fall with fractured femur, breast ca concerning for   mets  COMMENTS:       PROCEDURE: MR  - MR FEMUR LT  WO/W CONTRAST  - Mar 20 2014  6:44PM      CLINICAL DATA:  Fall with femur fracture. Breast cancer. Clinical  concern for metastatic disease.    EXAM:  MR OF THE LEFT LOWER EXTREMITY WITHOUT AND WITH CONTRAST    TECHNIQUE:  Multiplanar, multisequence MR imaging of the lower left extremity  was performed both before and after administration of intravenous  contrast.    CONTRAST:  15 mL MultiHance.      COMPARISON:  Radiographs 03/18/2014.    FINDINGS:  There is an incomplete intertrochanteric left femur fracture. The  fracture is nondisplaced. This does not extend into the femoral  neck. The femoral head appears intact. Otherwise the bone marrow  signal appears within normal limits. No other fractures are  identified. No lesions suspicious for metastatic disease. Active red  marrow is present in the femur bilaterally. Bilateral knee effusions  are visualized. There is bilateral adductor compartment strain and  reactive edema in the proximal right quadriceps and gluteal muscles  associated with the intertrochanteric left femur fracture.     IMPRESSION:  1. Incomplete left proximal femur intertrochanteric fracture. The  radiographically evident greater trochanter fracture extends into  the intertrochanteric region. Intertrochanteric extension was occult  on plain film.  2. Bilateral adductor strain and reactive edema in the left hip  girdle musculature.  3. No evidence of osseous metastatic disease.      Electronically Signed    By: Geoffrey  Lamke M.D.    On: 03/21/2014 07:14     Verified By: GEOFFREY T. LAMKE, M.D.,  CT:    16-Jun-15 16:36, CT Abdomen and Pelvis W/WO Contrast  CT Abdomen and Pelvis W/WO Contrast   REASON FOR EXAM:    (1) Hematuria- Look for kidney mass/ stone; (2) had   radiation therapy in past- d  COMMENTS:       PROCEDURE: CT  - CT ABDOMEN / PELVIS  W/WO  - Mar 25 2014  4:36PM     CLINICAL DATA:  Gross hematuria. Question renal mass. History of  left breast cancer with liver and  pulmonary metastasis.    EXAM:  CT ABDOMEN AND PELVIS WITHOUT AND WITH CONTRAST    TECHNIQUE:  Multidetector CT imaging of the abdomen and pelvis was performed  following the standard protocol before and following the bolus  administration of intravenous contrast.    CONTRAST:  125 mL Isovue    COMPARISON:  CT 02/17/2014    FINDINGS:  Lung bases: Small left effusion and left basilar atelectasis. The  pericardial fluid.    Kidneys / Ureters / Bladder: None IVcontrast images demonstrate no  nephrolithiasis or ureterolithiasis. Cortical phase imaging  demonstrates no enhancing renal cortical lesion. There is bilateral  mild hydronephrosis and hydroureter. No obstructing lesion is  identified. No delayed pyelogram phase was performed. There is a  Foley catheter within the bladder. No bladder abnormality otherwise.  Gas within the bladder presumably related to catheterization    The liver is shrunken, nodular and with low attenuation lesions in  the right hepatic lobe consistent with metastatic disease. Findings  are similar to recent CT of 02/17/2014. Gallbladder is absent. The  pancreas is normal. The spleen is enlarged. Adrenal glands are  normal. Small amount free fluid the pelvis. The stomach, small  bowel, colon are unremarkable.    Review of bone windows demonstrate extensive soft tissue edema  associated with the left hip fracture. There is a sclerotic  metastasis within the posterior left iliac bone as well as the right  sacral ale. Several scattered metastases are noted through the  lumbar and lower thoracic spine which are mildly progressed from  08/16/2013     IMPRESSION:    1. Bilateral mild hydronephrosis and hydroureter which is new from  CT of 02/17/2014. No obstructing lesionidentified.  2. No enhancing renal cortical lesion.  3. Foley catheter in the bladder.  4. Shrunken nodular liver with evidence of metastasis, not changed  from   prior.  5. Ascites is  unchanged.  6. Splenomegaly unchanged.  7. Mild progression of skeletal metastasis compared to CT of  08/16/2013  Electronically Signed    By: Suzy Bouchard M.D.    On: 03/25/2014 16:58         Verified By: Rennis Golden, M.D.,   Pertinent Past History:  Pertinent Past History Stage IV breast cancer with known metastases to the lung and liver, anxiety and depression not otherwise specified.   Hospital Course:  Hospital Course A 54 year old female with history of stage IV breast cancer receiving active chemotherapy, actually to receive chemotherapy tomorrow. Presenting after a mechanical fall. Found to have left greater trochanteric fracture.   * Acute anemia - post operative- status post blood Tx and Hemoglobin stable . has known anemia of chronic disease  Later after sx- on lovenox also dveloped hematuria- which resolved on taking off lovenox. lovenox on hold, monitor for now, no active bleeding  *  Left greater trochanteric & intertrochanteric fracture:s/p ORIF and POD # 9, improving slowly   Initially on Xray it was not clearly visible- but later with MRI- noted having extension in intertrochenteric area- so sx done. physical therapy, pain management discharge to rehab. not on DVT prophylaxis due to bleed- anemia and thrombocytopenia  * Hematuria and UTI- finished Rocephin IV, reviewed Cx- Strep Agalacti frm 03/20/14     appreciated Urology consult for managing it.     CT kidney done- no source- likley Lovenox may have caused, or thrombocytopenia. improved  * Coagulopathy and low platelets     s/p chemo for cancer and has ch liver issues.     Given platelets pre and post op. Improving now     being followed by hematology.      no indication for Tx- stable.  * Venous thromboembolism prophylaxis with sequential compression devices.      stopped Lovenox due to Bleed- post op hip sx.   Condition on Discharge Stable   Code Status:  Code Status No Code/Do Not  Resuscitate   DISCHARGE INSTRUCTIONS HOME MEDS:  Medication Reconciliation: Patient's Home Medications at Discharge:     Medication Instructions  ondansetron 4 mg oral tablet  1 tab(s) orally every 6 hours, As Needed - for Nausea, Vomiting   aldactone 100 mg oral tablet  1 tab(s) orally once a day   paroxetine hcl 40 mg tablet  1 dose(s) orally once a day   potassium chloride 10 meq oral capsule, extended release  1 cap(s) orally 2 times a day   esomeprazole 40 mg oral delayed release capsule  1 cap(s) orally once a day   fentanyl  Apply topically to affected area every 3 days   oxycodone 15 mg oral tablet, extended release  1 tab(s) orally every 12 hours   acetaminophen-oxycodone 325 mg-5 mg oral tablet  1 tab(s) orally every 4 hours, As Needed, pain , As needed, pain   alprazolam 0.25 mg oral tablet  1 tab(s) orally every 8 hours, As Needed, anxiety , As needed, anxiety   ferrous sulfate 325 mg (65 mg elemental iron) oral tablet  1 tab(s) orally 2 times a day (with meals)   docusate sodium 100 mg oral capsule  1 cap(s) orally 2 times a day, As Needed, constipation , As needed, constipation   ensure *  240 milliliter(s) orally 2 times a day    STOP TAKING THE FOLLOWING MEDICATION(S):    lasix 20 mg oral tablet: 1 tab(s) orally  once a day meloxicam 15 mg oral tablet: 1 tab(s) orally once a day. Comments:  metformin 500 mg oral tablet: 1 tab(s) orally 2 times a day. Comments:   Physician's Instructions:  Diet Low Sodium   Dietary Supplements Ensure   Dietary Supplements Frequency Two times per day   Activity Limitations As tolerated   Return to Work Not Applicable   Time frame for Follow Up Appointment 1-2 weeks  Orthopedic clinic   Time frame for Follow Up Appointment 1-2 weeks  Cancer clinic   Other Comments Advised to follow Hemoglobin and Platelets in 1 week with oncology clinic. follow in orthopedic clinic in 1 week.     Hessie Knows J(Ordered): [facility], [address] [place], [place] [address], [place]   Forest Gleason K(Other Provider): [facility], [address], [place], [place], [place], [place] [address], [place] [phone]  Electronic Signatures: Vaughan Basta (MD)  (Signed 22-Jun-15 10:41)  Authored: ADMISSION DATE AND DIAGNOSIS, CHIEF COMPLAINT/HPI, Allergies, PERTINENT LABS, PERTINENT RADIOLOGY STUDIES, PERTINENT PAST [place], PATIENT INSTRUCTIONS, Follow Up Physician   Last Updated: 22-Jun-15 10:41 by Vaughan Basta (MD)

## 2015-01-31 NOTE — Discharge Summary (Signed)
PATIENT NAME:  Paula [name], WARDROP MR#:  [id number] DATE OF BIRTH:  27-May-1961  DATE OF ADMISSION:  03/18/2014 DATE OF DISCHARGE:  04/01/2014  Addendum  This is an addendum to earlier dictated discharge summary done on 03/31/2014. We were waiting for getting insurance authorization and placement in a rehab center for the patient, but finally, because she had improvement in her physical condition, she decided to go home with help of family, and so discharged on 04/01/2014. For further details, please see discharge summary done on 03/31/2014.   ____________________________ Ceasar Lund. Anselm Jungling, MD [id number] D: 04/05/2014 12:35:01 ET T: 04/05/2014 13:05:39 ET JOB#: [id number]  cc: Ceasar Lund. Anselm Jungling, MD, <Dictator> Vaughan Basta MD ELECTRONICALLY SIGNED 04/07/2014 11:06

## 2015-01-31 NOTE — Consult Note (Signed)
Requesting Physician: Dr. Lavetta Nielsen for consult: Gross hematuria OF PRESENT ILLNESS: Paula [name] is a 54 year old Caucasian female with past medical history of stage IV breast cancer with known lung and liver metastases, presenting after a mechanical fall, who is seen in consultation at the request of Dr. Lavetta Nielsen for eval and recs regarding gross hematuria. Paula [name] under a repair of a femur fracture earlier this morning, and when her catheter was placed intraoperatively she was noted to have gross hematuria. She is now recovering on the floor, and much of the history is obtained from the notes and her partner in the room. She notes [place] for the last month. She has been undergoing chemotherapy for breast cancer with Abraxane. No UTIs, no h/o hematuria prior, no stones, no GU malignancy. She denies urgency, frequency, or dysuria. She has occ urgency incontinence but no SUI. She does report a history of pelvic radiation to her ovaries as tx for her breast cancer - this occured somewhere around 2002-2005.  OF SYSTEMS: point ROS is neg except for pert pos and neg noted in HPI MEDICAL HISTORY: Stage IV breast cancer with known metastases to the lung and livermarrow transplantaxillary, and pelvic radiation  HISTORY: Denies any alcohol, tobacco or drug usage.  HISTORY: Positive for coronary artery disease.  codeine, latex, shellfish  MEDICATIONS: Include Aldactone 100 mg p.o. daily, Duragesic patch 25 mcg transdermal every 72 hours, paroxetine 40 mg p.o. daily, Zofran 4 mg p.o. q.6 hours as needed for nausea or vomiting, alprazolam 0.5 mg p.o. 4 times daily, Lasix 20 mg p.o. daily, potassium 10 mEq 2 tablets daily.  EXAMINATION: SIGNS: afvsssomnolent female in bed in NAD, awakens to questions but falls asleep easily during questioning Normocephalic, atraumatic. Pupils equal, round, reactive to light. Extraocular muscles intact. No scleral icterus. Moist mucosal membrane. NOSE, THROAT: Clear without exudates. No external  lesions. Supple. No thyromegaly. No nodules. No JVD. Clear to auscultation bilaterally without wheezes, rubs or rhonchi. Kermit Balo respiratory effort. Nontender to palpation. S1, S2, regular rate and rhythm. No murmurs, rubs or gallops. Soft, nontender, nondistended. No masses. Positive bowel sounds. No hepatosplenomegaly. No swelling, clubbing or edema. Range of motion limited in the left lower extremity secondary to pain and wrapfoley in place with old clots in tubing but current urine is clear yellowNo ulcerations, lesions, rash or cyanosis.   DATA:38 this am, 65 s/p transfusion0.811 --> 22with > 100K GBS3+ blood, 13 epith cells, amorphous crystals 54 y/o F with stage IV breast cancer with GH in setting of thrombocytopenia, anticoagulation, chemotherapy, and Ucx with >100K GBS are many possibilities for her hematuria, esp given her problems noted above, but main possibilities would be radiation cystitis from pelvic radiation, UTI, stone disease, or GU tract involvement of her cancer. Given that her urine is clearing up, she does not need any CBI at this time. Her catheter can be removed at the discretion of her primary team. I suspect her UCx is a false positive given the poorly collected u/a (13 epith cells), but given her immunocompromised status and hematuria it is probnably worth treating for a short course of therapy. This may help the hematuria. Also, given the findings of crystals on her u/a and the presence of blood for the last month, she needs a CT scan to r/o upper tract pathology, e.g. stone disease or metastatic disease involving her GU tract. A CT urogram (CT A/p with and without contrast with delayed contrast images) can be ordered for this once the patient is more  awake and able to tolerate a CT scan. She would also need some outpatient f/u for a cystoscopy once she has recovered and can tolerate (and is cleared) being in frog-leg or lithotomy position given her recent surgery. Will re-eval urine  tomorrow. Thank you for the consult.  Electronic Signatures: Charna Archer (MD)  (Signed on 13-Jun-15 22:10)  Authored  Last Updated: 13-Jun-15 22:10 by Charna Archer (MD)

## 2015-01-31 NOTE — Op Note (Signed)
PATIENT NAME:  Paula [name], Paula [name] MR#:  [id number] DATE OF BIRTH:  24-Aug-1961  DATE OF PROCEDURE:  03/22/2014  PREOPERATIVE DIAGNOSIS: Left intertrochanteric hip fracture.   POSTOPERATIVE DIAGNOSIS: Left intertrochanteric hip fracture.   PROCEDURE: Left hip nailing.   ANESTHESIA: General.   SURGEON: Hessie Knows, MD   DESCRIPTION OF PROCEDURE: The patient was brought to the operating room and after adequate anesthesia was obtained, the patient was transferred to the fracture table where a Foley catheter was placed. The right leg was placed in the well leg holder, left foot in the traction boot without traction required. C-arm was brought in and good visualization of the proximal femur was obtained, with no displacement of the fracture. The hip was prepped and draped in the usual standard method and appropriate patient identification and timeout procedures were completed. An approximately 2.5 cm incision was made proximal to the greater trochanter. Subcutaneous tissue was spread and a guidewire inserted into the tip of the trochanter, centered on the lateral view. Proximal reaming was carried out and a long guidewire inserted down the canal. Measurements were made on this and a 9 x 360 mm, left 130 degree Affixus rod was obtained and inserted down the canal without reaming secondary to the patient's low platelet count. The rod went down without difficulty. It was impacted to the appropriate level. A lateral incision was made and a guidewire inserted in a center-center position on the head, measured, drilled, and a 100 mm lag screw was inserted. The proximal locking screw was set and returned back a quarter turn to allow for compression. The proximal assembly was then removed, with permanent AP and lateral images obtained. Going distally on the lateral view the distal slotted hole was identified, a small incision made laterally, and subcutaneous tissue spread. Drilling made across the bone, measured, and a 44  mm 5.0 cortical screw inserted as a distal interlocking screw. The wounds were irrigated, the wound closed with 0 Vicryl deep, 2-0 Vicryl subcutaneously, and skin staples. Xeroform, 4 x 4, ABD, and tape applied. The patient was sent to the recovery room in stable condition.   ESTIMATED BLOOD LOSS: 200.   COMPLICATIONS: None.   SPECIMEN: None.   IMPLANTS: [employer] Affixus 11 x 360, 130 degree hip fracture nail with a 100 mm lag screw and 44 mm 5.0 cortical bone screw.    ____________________________ Laurene Footman, MD mjm:jcm D: 03/22/2014 10:35:23 ET T: 03/22/2014 21:29:54 ET JOB#: [id number]  cc: Laurene Footman, MD, <Dictator> Laurene Footman MD ELECTRONICALLY SIGNED 03/23/2014 9:01

## 2015-01-31 NOTE — H&P (Signed)
PATIENT NAME:  Paula [name], Paula [name] I MR#:  [id number] DATE OF BIRTH:  Jun 15, 1961  DATE OF ADMISSION:  03/18/2014  REFERRING PHYSICIAN: Benjaman Lobe.   PRIMARY CARE PHYSICIAN: Rosanna Randy.   CHIEF COMPLAINT: Hip pain.   HISTORY OF PRESENT ILLNESS: A 54 year old Caucasian female with past medical history of stage IV breast cancer with known lung and liver metastases, presenting after a mechanical fall. Acute onset mechanical fall with associated left hip pain. Describes this as sharp, 10 out of 10 in intensity. Worse with movements. No relieving factors. Nonradiating. No further symptomatology.   REVIEW OF SYSTEMS:  CONSTITUTIONAL: Denies fevers, chills, fatigue, weakness.  EYES: Denies blurred vision, double vision, eye pain.  EARS, NOSE, THROAT: Denies tinnitus, ear pain, hearing loss.  RESPIRATORY: Denies cough, wheeze, shortness of breath.  CARDIOVASCULAR: Denies chest pain, palpitations, edema.  GASTROINTESTINAL: Denies nausea, vomiting, diarrhea, abdominal pain.  GENITOURINARY: Denies dysuria, hematuria.  ENDOCRINE: Denies nocturia or thyroid problems.  HEMATOLOGIC AND LYMPHATIC: Denies easy bruising, bleeding.  SKIN: Denies rash or lesions.  MUSCULOSKELETAL: Positive for left hip pain as described above. Denies any neck, back, shoulder or knee pain.  NEUROLOGIC: Denies paralysis, paresthesias.  PSYCHIATRIC: Denies anxiety or depressive symptoms.   Otherwise, full review of systems performed by me is negative.   PAST MEDICAL HISTORY: Stage IV breast cancer with known metastases to the lung and liver, anxiety and depression not otherwise specified.   SOCIAL HISTORY: Denies any alcohol, tobacco or drug usage.   FAMILY HISTORY: Positive for coronary artery disease.   ALLERGIES: CODEINE, SHELLFISH AND LATEX.   HOME MEDICATIONS: Include Aldactone 100 mg p.o. daily, Duragesic patch 25 mcg transdermal every 72 hours, paroxetine 40 mg p.o. daily, Zofran 4 mg p.o. q.6 hours as needed for nausea or  vomiting, alprazolam 0.5 mg p.o. 4 times daily, Lasix 20 mg p.o. daily, potassium 10 mEq 2 tablets daily.   PHYSICAL EXAMINATION:  VITAL SIGNS: Temperature 99, heart rate 109, respirations 20, blood pressure 101/65, saturating 99% on room air.  GENERAL: Chronically ill-appearing Caucasian female who is in moderate distress secondary to pain.  HEAD: Normocephalic, atraumatic.  EYES: Pupils equal, round, reactive to light. Extraocular muscles intact. No scleral icterus.  MOUTH: Moist mucosal membrane. Dentition intact. No abscess noted.  EARS, NOSE, THROAT: Clear without exudates. No external lesions.  NECK: Supple. No thyromegaly. No nodules. No JVD.  PULMONARY: Clear to auscultation bilaterally without wheezes, rubs or rhonchi. No use of accessory muscles. Good respiratory effort.  CHEST: Nontender to palpation.  CARDIOVASCULAR: S1, S2, regular rate and rhythm. No murmurs, rubs or gallops. No edema. Pedal pulses 2+ bilaterally.  GASTROINTESTINAL: Soft, nontender, nondistended. No masses. Positive bowel sounds. No hepatosplenomegaly.  MUSCULOSKELETAL: No swelling, clubbing or edema. Range of motion limited in the left lower extremity secondary to pain. Palpable tenderness over left hip on the lateral surface.  NEUROLOGIC: Cranial nerves II through XII intact. No gross focal neurologic deficits. Sensation intact. Reflexes intact.  SKIN: No ulcerations, lesions, rash or cyanosis. Port on chest without surrounding erythema or edema. Skin warm, dry. Turgor intact.  PSYCHIATRIC: Mood and affect within normal limits. The patient is awake, alert, oriented x 3. Insight and judgment intact.   LABORATORY DATA: Sodium 131, potassium 3.1, chloride 96, bicarb 25, BUN 11, creatinine 1.23, glucose 160. LFTs: Total protein 5.7, albumin 2.9, bilirubin 1, alk phos 122, AST 26, ALT 14. WBC 24.8, hemoglobin 10.8, platelets of 103. Film of the left hip reveals nondisplaced greater trochanteric fracture.   ASSESSMENT  AND  PLAN: A 54 year old female with history of stage IV breast cancer receiving active chemotherapy, actually to receive chemotherapy tomorrow. Presenting after a mechanical fall. Found to have left greater trochanteric fracture.  1. Intractable pain due to left greater trochanteric fracture: Orthopedic consult. They already discussed this issue in the Emergency Department and deemed nonsurgical. Provide pain control with p.r.n. Dilaudid. Will switch to a PCA and stop p.r.n. medications when pca is setup. Her basal pain medication is oxycodone 10 mg which she takes 1 to 2 times daily, as well as a fentanyl patch 25 mcg with a total equivalent of 25 mg intravenous morphine daily which at baseline is about 3 to 5 mg Dilaudid daily which is 1 mg Dilaudid q.4 hours. Once again, this is her baseline regimen. She will require at least that at baseline, so we can expect her to require more medications than what she is receiving thus far which is why we will switch her to PCA so we can calculate her 24-hour opioid need and then switch her over to an appropriate oral agent. Will initiate bowel regimen as well.  2. Greater trochanteric fracture: Orthopedic consult. Physical therapy consult.  3. Hyponatremia: Intravenous fluid hydration with normal saline.  4. Hypokalemia: Replace with goal of 4 to 5.  5. Venous thromboembolism prophylaxis with sequential compression devices.   The patient is DNR.   TIME SPENT: 45 minutes.   ____________________________ Aaron Mose. Ginnette Gates, MD dkh:gb D: 03/18/2014 23:11:20 ET T: 03/18/2014 23:54:11 ET JOB#: [id number]  cc: Aaron Mose. Adylene Dlugosz, MD, <Dictator> Maricruz Lucero Woodfin Ganja MD ELECTRONICALLY SIGNED 03/20/2014 8:16

## 2015-01-31 NOTE — Consult Note (Signed)
PATIENT NAME:  Paula [name], AUTH I MR#:  [id number] DATE OF BIRTH:  [birth year]  DATE OF CONSULTATION:  03/21/2014  REFERRING PHYSICIAN:   CONSULTING PHYSICIAN:  Simonne Come. Inez Pilgrim, MD  HISTORY OF PRESENT ILLNESS: Paula [name] is a 54 year old patient known to me, primarily followed by Dr. Oliva Bustard, with breast cancer. She has lung and liver metastasis. 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 Last treatments of Abraxane May 13th and then Abraxane again May 27th. On this background, the patient was admitted on June 9th after a fall associated left hip pain. At the time of admission, the liver functions were unremarkable. Hemoglobin was 10.8, white count was 24.8, and platelets of 103,000; these numbers are consistent with prior cytopenia from chemotherapy and some leukocytosis attributed to pain and from the prior Neulasta. There was a nondisplaced greater trochanter fracture. She was admitted for orthopedic consultation. There is also hyponatremia and hypokalemia that was replaced and pain management was addressed. Subsequently, x-rays revealed that the fracture requires surgical correction. I saw and evaluated the patient on June 12th, put a note on the chart at that time, discussed with Dr. Rudene Christians at that time, although this narrative is delayed and rendered at this time. From the oncology point of view note that platelets normally range 55 to 80 and hemoglobin normally 9.5 to 10 due to previous treatments and from the effects of splenomegaly as the patient also has liver dysfunction, low albumin, cirrhosis, prolonged PT. Has had ascites and paracentesis recently. The presurgical counts, on June 12th, were a bit lower than baseline. Hemoglobin was 9.5 and the platelet count was 38,000 consistent with recent chemotherapy, hydration, possible blood loss and consumption. Plan was to give blood and transfusion  support.   PAST MEDICAL HISTORY: The patient's history also includes anxiety and depression as well as cancer and multiple therapies.   SOCIAL HISTORY: Negative for alcohol or tobacco.   ALLERGIES: CODEINE, SHELLFISH AND LATEX.   MEDICATIONS AT THE TIME OF ADMISSION: Include Aldactone 100 daily, Duragesic 25 mcg patch, paroxetine 40 mg daily, Zofran 4 mg p.r.n., alprazolam 0.5 mg 4 times a day, Lasix 20 daily, potassium 20 mEq daily.   IMPRESSION AND PLAN: As discussed with Dr. Rudene Christians, platelet and blood transfusions are indicated, blood to transfuse as per orthopedics optimal level, platelets also as per orthopedic optimal level; they were comfortable with surgery if platelets 60 or above. Therefore, we arranged for pheresis products, 2 units of platelets. One was to be given pre and more given peri--procedure, which was done. Also, as per orthopedics, the patient was a high risk for postoperative deep vein thrombosis given cancer and the hip surgery. Therefore, the initial plans were for Lovenox 30 mg p.o. q. 12 hours initially and then 40 a day when the patient goes home. Time course will be determined by orthopedics. It was recognized that this would increase the risk of bleed and so plans were made to watch the hemoglobin and to make sure platelet count is maintained over 50,000. In addition, the patient had some intermittent, subsequently developing more significant hematuria that was evaluated by urology and followed post operatively. Pro time also to be watched as due to cirrhosis is somewhat prolonged. Nothing acutely from cancer treatment, the patient's recent treatment as noted.   ____________________________ Simonne Come Inez Pilgrim, MD rgg:sb D: 03/24/2014 08:35:52 ET T: 03/24/2014 09:01:34 ET JOB#: [id number]  cc: Simonne Come. Inez Pilgrim, MD, <Dictator>  Dallas Schimke MD ELECTRONICALLY SIGNED 04/24/2014 13:06

## 2015-01-31 NOTE — Consult Note (Signed)
PATIENT NAME:  Paula [name], Paula [name] MR#:  [id number] DATE OF BIRTH:  Feb 03, 1961  DATE OF CONSULTATION:  03/19/2014  REFERRING PHYSICIAN:  Valentino Nose, MD CONSULTING PHYSICIAN:  Laurice Record. Holley Bouche., MD  CHIEF COMPLAINT: Left hip pain.   HISTORY OF PRESENT ILLNESS: The patient is a 54 year old female who sustained a mechanical fall on the date of injury and landed on her left side. She had immediate onset of lateral left hip pain and had difficulty with standing or ambulating due to the severity of the pain. She denied any other injuries. Mechanism of injury was a slip when she apparently tripped on sheets that she was carrying into the laundry room.   The patient has been using a walker due to equilibrium issues associated with her chemotherapy. She denies any groin pain. She denies any knee or ankle pain at the present time.   PAST MEDICAL HISTORY:  1.  Stage IV breast cancer with metastasis to the lung and liver.  2.  Anxiety and depression.   ALLERGIES: CODEINE, SHELLFISH, LATEX.   HOME MEDICATIONS: 1.  Aldactone 100 mg daily.  2.  Duragesic patch 25 mcg transdermally q. 8 hours 72 hours.  3.  Paroxetine 40 mg daily.  4.  Zofran 4 mg p.o. q. 6 hours p.r.n. nausea.  5.  Alprazolam 0.5 mg p.o. q.[name].d.  6.  Lasix 20 mg p.o. daily.  7.  Potassium 10 mEq 2 tablets daily.   SOCIAL HISTORY: The patient is married. She denies any current alcohol, tobacco, or illicit drug use. She has been undergoing chemotherapy for her metastatic breast cancer.   FAMILY HISTORY: Positive for coronary artery disease.   REVIEW OF SYSTEMS: Pertinent musculoskeletal review of systems is positive for lateral left hip pain. She denies any groin, thigh, or knee pain. She denies any radiation of pain down the leg. Review of systems with neurologic function is negative for numbness or gross paresthesias.   PHYSICAL EXAMINATION: GENERAL: The patient is a pleasant well-developed, well-nourished female seen lying in the bed  in no acute distress.  HEENT: Atraumatic, normocephalic. Sclerae are clear. Extraocular motion is intact. The patient is bald secondary to chemotherapy.  NECK: Supple, nontender, with good range of motion.  LOWER EXTREMITIES: There is point tenderness to palpation over the left greater trochanter. Gentle passive range of motion of the right hip is well tolerated. No gross tenderness to palpation about the knee.  NEUROLOGIC: Neurovascular function is grossly intact without focal deficit. Balance and gait was not assessed.   X-RAYS: Radiographs of the pelvis and left hip from [facility] were reviewed. There is a nondisplaced fracture of the left greater trochanter. I do not appreciate any fracture extending into the femoral head, neck, or intertrochanteric region. I do not appreciate any gross lytic lesions about the hip. No heterotopic ossification is appreciated.   IMPRESSION: Nondisplaced fracture of the left greater trochanter.   PLAN: Findings were discussed in detail with the patient. [name] also discussed the patient's status with physical therapy and with Dr. Max Sane. [name] agree with current analgesia for pain control. Physical therapy may proceed with bed mobility, transfers, and progress to gait training with weight-bearing as tolerated to the left lower extremity using a walker. There is no indication for surgical intervention. The patient may return to the office in approximately 1 month for repeat radiographs of the left hip.   ____________________________ Laurice Record. Holley Bouche., MD jph:sb D: 03/20/2014 07:00:42 ET T: 03/20/2014 07:19:50 ET  JOB#: [id number]  cc: Jeneen Rinks P. Holley Bouche., MD, <Dictator> Laurice Record Holley Bouche MD ELECTRONICALLY SIGNED 03/29/2014 15:26

## 2015-01-31 NOTE — Consult Note (Signed)
Brief Consult Note: Diagnosis: Nondisplaced fracture of left greater trochanter.   Patient was seen by consultant.   Consult note dictated.   Discussed with Attending MD.   Comments: Findings discussed with patient and her family. I also discussed the case with PT. OK to begin PT with bed mobility, transfers, and advance to gait training with WBAT.  Electronic Signatures: Dereck Leep (MD)  (Signed 11-Jun-15 06:49)  Authored: Brief Consult Note   Last Updated: 11-Jun-15 06:49 by Dereck Leep (MD)

## 2015-02-04 ENCOUNTER — Other Ambulatory Visit: Payer: Self-pay | Admitting: *Deleted

## 2015-02-04 DIAGNOSIS — C50919 Malignant neoplasm of unspecified site of unspecified female breast: Secondary | ICD-10-CM

## 2015-02-04 LAB — COMPREHENSIVE METABOLIC PANEL
ALK PHOS: 91 U/L
ANION GAP: 7 (ref 7–16)
Albumin: 4.3 g/dL
BUN: 15 mg/dL
Bilirubin,Total: 1.1 mg/dL
CO2: 29 mmol/L
Calcium, Total: 9.1 mg/dL
Chloride: 97 mmol/L — ABNORMAL LOW
Creatinine: 0.83 mg/dL
EGFR (African American): >60
GLUCOSE: 157 mg/dL — AB
Potassium: 4 mmol/L
SGOT(AST): 20 U/L
SGPT (ALT): 14 U/L
Sodium: 133 mmol/L — ABNORMAL LOW
Total Protein: 7.2 g/dL

## 2015-02-04 LAB — CBC CANCER CENTER
BASOS ABS: 0 x10 3/mm (ref 0.0–0.1)
Basophil %: 0.5 %
Eosinophil #: 0 x10 3/mm (ref 0.0–0.7)
Eosinophil %: 0.5 %
HCT: 41.6 % (ref 35.0–47.0)
HGB: 13.8 g/dL (ref 12.0–16.0)
LYMPHS ABS: 1.1 x10 3/mm (ref 1.0–3.6)
Lymphocyte %: 13.2 %
MCH: 28.8 pg (ref 26.0–34.0)
MCHC: 33.2 g/dL (ref 32.0–36.0)
MCV: 87 fL (ref 80–100)
Monocyte #: 0.5 x10 3/mm (ref 0.2–0.9)
Monocyte %: 5.7 %
Neutrophil #: 6.9 x10 3/mm — ABNORMAL HIGH (ref 1.4–6.5)
Neutrophil %: 80.1 %
PLATELETS: 75 x10 3/mm — AB (ref 150–440)
RBC: 4.8 10*6/uL (ref 3.80–5.20)
RDW: 17.9 % — AB (ref 11.5–14.5)
WBC: 8.6 x10 3/mm (ref 3.6–11.0)

## 2015-02-05 ENCOUNTER — Other Ambulatory Visit: Payer: Self-pay | Admitting: Oncology

## 2015-02-05 DIAGNOSIS — C787 Secondary malignant neoplasm of liver and intrahepatic bile duct: Secondary | ICD-10-CM

## 2015-02-05 DIAGNOSIS — C7800 Secondary malignant neoplasm of unspecified lung: Secondary | ICD-10-CM

## 2015-02-05 DIAGNOSIS — C50919 Malignant neoplasm of unspecified site of unspecified female breast: Secondary | ICD-10-CM

## 2015-02-05 DIAGNOSIS — R978 Other abnormal tumor markers: Secondary | ICD-10-CM

## 2015-02-05 LAB — CANCER ANTIGEN 27.29: CA 27.29: 44.6 U/mL — ABNORMAL HIGH (ref 0.0–38.6)

## 2015-02-16 ENCOUNTER — Ambulatory Visit: Payer: Self-pay

## 2015-02-17 ENCOUNTER — Telehealth: Payer: Self-pay | Admitting: *Deleted

## 2015-02-17 ENCOUNTER — Ambulatory Visit
Admission: RE | Admit: 2015-02-17 | Discharge: 2015-02-17 | Disposition: A | Discharge status: Home / Self Care | Payer: Managed Care, Other (non HMO) | Source: Ambulatory Visit | Attending: Oncology | Admitting: Oncology

## 2015-02-17 DIAGNOSIS — C7800 Secondary malignant neoplasm of unspecified lung: Secondary | ICD-10-CM

## 2015-02-17 DIAGNOSIS — Z853 Personal history of malignant neoplasm of breast: Secondary | ICD-10-CM | POA: Diagnosis present

## 2015-02-17 DIAGNOSIS — C787 Secondary malignant neoplasm of liver and intrahepatic bile duct: Secondary | ICD-10-CM | POA: Diagnosis present

## 2015-02-17 DIAGNOSIS — R978 Other abnormal tumor markers: Secondary | ICD-10-CM

## 2015-02-17 DIAGNOSIS — C50919 Malignant neoplasm of unspecified site of unspecified female breast: Secondary | ICD-10-CM

## 2015-02-17 DIAGNOSIS — C3490 Malignant neoplasm of unspecified part of unspecified bronchus or lung: Secondary | ICD-10-CM | POA: Diagnosis present

## 2015-02-17 LAB — GLUCOSE, CAPILLARY: Glucose-Capillary: 131 mg/dL — ABNORMAL HIGH (ref 70–99)

## 2015-02-17 NOTE — Telephone Encounter (Signed)
PET had to be rescheduled to tomorrow morning at 8:30. Patient came in chewing gum and they could not do scan

## 2015-02-18 ENCOUNTER — Inpatient Hospital Stay (HOSPITAL_BASED_OUTPATIENT_CLINIC_OR_DEPARTMENT_OTHER): Payer: Managed Care, Other (non HMO) | Admitting: Oncology

## 2015-02-18 ENCOUNTER — Ambulatory Visit
Admission: RE | Admit: 2015-02-18 | Discharge: 2015-02-18 | Disposition: A | Discharge status: Home / Self Care | Payer: Managed Care, Other (non HMO) | Source: Ambulatory Visit | Attending: Oncology | Admitting: Oncology

## 2015-02-18 ENCOUNTER — Inpatient Hospital Stay: Payer: Managed Care, Other (non HMO) | Attending: Oncology

## 2015-02-18 ENCOUNTER — Ambulatory Visit: Payer: Self-pay | Admitting: Internal Medicine

## 2015-02-18 ENCOUNTER — Inpatient Hospital Stay: Payer: Managed Care, Other (non HMO)

## 2015-02-18 VITALS — BP 133/84 | HR 91 | Temp 96.5°F | Wt 222.7 lb

## 2015-02-18 DIAGNOSIS — Z79899 Other long term (current) drug therapy: Secondary | ICD-10-CM

## 2015-02-18 DIAGNOSIS — C50919 Malignant neoplasm of unspecified site of unspecified female breast: Secondary | ICD-10-CM

## 2015-02-18 DIAGNOSIS — R161 Splenomegaly, not elsewhere classified: Secondary | ICD-10-CM | POA: Insufficient documentation

## 2015-02-18 DIAGNOSIS — Z79811 Long term (current) use of aromatase inhibitors: Secondary | ICD-10-CM | POA: Diagnosis not present

## 2015-02-18 DIAGNOSIS — Z800 Family history of malignant neoplasm of digestive organs: Secondary | ICD-10-CM

## 2015-02-18 DIAGNOSIS — C50912 Malignant neoplasm of unspecified site of left female breast: Secondary | ICD-10-CM | POA: Insufficient documentation

## 2015-02-18 DIAGNOSIS — K219 Gastro-esophageal reflux disease without esophagitis: Secondary | ICD-10-CM | POA: Insufficient documentation

## 2015-02-18 DIAGNOSIS — C7951 Secondary malignant neoplasm of bone: Secondary | ICD-10-CM

## 2015-02-18 DIAGNOSIS — C787 Secondary malignant neoplasm of liver and intrahepatic bile duct: Secondary | ICD-10-CM

## 2015-02-18 DIAGNOSIS — D6959 Other secondary thrombocytopenia: Secondary | ICD-10-CM | POA: Diagnosis not present

## 2015-02-18 DIAGNOSIS — E119 Type 2 diabetes mellitus without complications: Secondary | ICD-10-CM | POA: Insufficient documentation

## 2015-02-18 DIAGNOSIS — Z853 Personal history of malignant neoplasm of breast: Secondary | ICD-10-CM | POA: Diagnosis present

## 2015-02-18 DIAGNOSIS — Z9221 Personal history of antineoplastic chemotherapy: Secondary | ICD-10-CM | POA: Insufficient documentation

## 2015-02-18 DIAGNOSIS — Z5111 Encounter for antineoplastic chemotherapy: Secondary | ICD-10-CM | POA: Diagnosis present

## 2015-02-18 DIAGNOSIS — Z418 Encounter for other procedures for purposes other than remedying health state: Secondary | ICD-10-CM | POA: Diagnosis not present

## 2015-02-18 DIAGNOSIS — C3490 Malignant neoplasm of unspecified part of unspecified bronchus or lung: Secondary | ICD-10-CM | POA: Diagnosis present

## 2015-02-18 DIAGNOSIS — Z923 Personal history of irradiation: Secondary | ICD-10-CM | POA: Diagnosis not present

## 2015-02-18 DIAGNOSIS — M129 Arthropathy, unspecified: Secondary | ICD-10-CM | POA: Diagnosis not present

## 2015-02-18 LAB — COMPREHENSIVE METABOLIC PANEL
ALK PHOS: 84 U/L (ref 38–126)
ALT: 16 U/L (ref 14–54)
AST: 19 U/L (ref 15–41)
Albumin: 3.7 g/dL (ref 3.5–5.0)
Anion gap: 3 — ABNORMAL LOW (ref 5–15)
BUN: 8 mg/dL (ref 6–20)
CALCIUM: 8.5 mg/dL — AB (ref 8.9–10.3)
CO2: 31 mmol/L (ref 22–32)
Chloride: 99 mmol/L — ABNORMAL LOW (ref 101–111)
Creatinine, Ser: 0.82 mg/dL (ref 0.44–1.00)
GFR calc Af Amer: >60 mL/min (ref >60)
Glucose, Bld: 200 mg/dL — ABNORMAL HIGH (ref 65–99)
POTASSIUM: 3.8 mmol/L (ref 3.5–5.1)
SODIUM: 133 mmol/L — AB (ref 135–145)
Total Bilirubin: 0.6 mg/dL (ref 0.3–1.2)
Total Protein: 6.1 g/dL — ABNORMAL LOW (ref 6.5–8.1)

## 2015-02-18 LAB — CBC WITH DIFFERENTIAL/PLATELET
BASOS ABS: 0.1 10*3/uL (ref 0–0.1)
Basophils Relative: 1 %
Eosinophils Absolute: 0.1 10*3/uL (ref 0–0.7)
Eosinophils Relative: 1 %
HCT: 38.3 % (ref 35.0–47.0)
Hemoglobin: 12.3 g/dL (ref 12.0–16.0)
Lymphs Abs: 1.2 10*3/uL (ref 1.0–3.6)
MCH: 28.4 pg (ref 26.0–34.0)
MCHC: 32.3 g/dL (ref 32.0–36.0)
MCV: 88.2 fL (ref 80.0–100.0)
Monocytes Absolute: 0.5 10*3/uL (ref 0.2–0.9)
Monocytes Relative: 5 %
NEUTROS ABS: 8 10*3/uL — AB (ref 1.4–6.5)
Neutrophils Relative %: 81 %
PLATELETS: 65 10*3/uL — AB (ref 150–440)
RBC: 4.34 MIL/uL (ref 3.80–5.20)
RDW: 17.9 % — AB (ref 11.5–14.5)
WBC: 9.9 10*3/uL (ref 3.6–11.0)

## 2015-02-18 LAB — GLUCOSE, CAPILLARY: Glucose-Capillary: 138 mg/dL — ABNORMAL HIGH (ref 70–99)

## 2015-02-18 MED ORDER — POTASSIUM CHLORIDE ER 10 MEQ PO TBCR: 10.0000 meq | EXTENDED_RELEASE_TABLET | Freq: Two times a day (BID) | ORAL | Status: DC

## 2015-02-18 MED ORDER — HEPARIN SOD (PORK) LOCK FLUSH 100 UNIT/ML IV SOLN
500.0000 [IU] | Freq: Once | INTRAVENOUS | Status: AC | PRN
  Administered 2015-02-18: 500 [IU]
  Filled 2015-02-18: qty 5

## 2015-02-18 MED ORDER — SODIUM CHLORIDE 0.9 % IJ SOLN
10.0000 mL | INTRAMUSCULAR | Status: AC | PRN
  Administered 2015-02-18: 10 mL
  Filled 2015-02-18: qty 10

## 2015-02-18 MED ORDER — PAROXETINE HCL 40 MG PO TABS: 40.0000 mg | ORAL_TABLET | ORAL | Status: DC

## 2015-02-18 MED ORDER — FENTANYL 25 MCG/HR TD PT72: 25.0000 ug | MEDICATED_PATCH | TRANSDERMAL | Status: DC

## 2015-02-18 MED ORDER — FLUDEOXYGLUCOSE F - 18 (FDG) INJECTION
11.9300 | Freq: Once | INTRAVENOUS | Status: AC | PRN
  Administered 2015-02-18: 11.93 via INTRAVENOUS

## 2015-02-18 MED ORDER — ERIBULIN MESYLATE CHEMO INJECTION 1 MG/2ML
1.1000 mg/m2 | Freq: Once | INTRAVENOUS | Status: AC
  Administered 2015-02-18: 2.35 mg via INTRAVENOUS
  Filled 2015-02-18: qty 4.7

## 2015-02-18 MED ORDER — SODIUM CHLORIDE 0.9 % IV SOLN
Freq: Once | INTRAVENOUS | Status: AC
  Administered 2015-02-18: 15:00:00 via INTRAVENOUS
  Filled 2015-02-18: qty 250

## 2015-02-18 MED ORDER — ALPRAZOLAM 0.25 MG PO TABS: 0.2500 mg | ORAL_TABLET | Freq: Three times a day (TID) | ORAL | Status: DC | PRN

## 2015-02-18 MED ORDER — SODIUM CHLORIDE 0.9 % IV SOLN
Freq: Once | INTRAVENOUS | Status: AC
  Administered 2015-02-18: 15:00:00 via INTRAVENOUS
  Filled 2015-02-18: qty 4

## 2015-02-19 ENCOUNTER — Other Ambulatory Visit: Payer: Self-pay | Admitting: *Deleted

## 2015-02-19 ENCOUNTER — Inpatient Hospital Stay: Payer: Managed Care, Other (non HMO)

## 2015-02-19 VITALS — BP 103/68 | HR 89 | Temp 96.7°F | Resp 18

## 2015-02-19 DIAGNOSIS — C50912 Malignant neoplasm of unspecified site of left female breast: Secondary | ICD-10-CM

## 2015-02-19 DIAGNOSIS — C787 Secondary malignant neoplasm of liver and intrahepatic bile duct: Principal | ICD-10-CM

## 2015-02-19 DIAGNOSIS — Z5111 Encounter for antineoplastic chemotherapy: Secondary | ICD-10-CM | POA: Diagnosis not present

## 2015-02-19 MED ORDER — PEGFILGRASTIM INJECTION 6 MG/0.6ML
6.0000 mg | Freq: Once | SUBCUTANEOUS | Status: AC
  Administered 2015-02-19: 6 mg via SUBCUTANEOUS
  Filled 2015-02-19: qty 0.6

## 2015-02-19 MED ORDER — PEGFILGRASTIM INJECTION 6 MG/0.6ML ~~LOC~~
PREFILLED_SYRINGE | SUBCUTANEOUS | Status: AC
  Filled 2015-02-19: qty 0.6

## 2015-02-20 ENCOUNTER — Ambulatory Visit: Payer: Managed Care, Other (non HMO)

## 2015-02-21 ENCOUNTER — Encounter: Payer: Self-pay | Admitting: Oncology

## 2015-02-21 NOTE — Progress Notes (Signed)
[place] @ [facility] [phone]  Fax:([phone]) [place] OB: 10/22/1960  MR#: [id number]  SVX#:[id number]  Patient Care Team: Birdie Sons, MD as PCP - General (Family Medicine) Robert Bellow, MD (General Surgery)  CHIEF COMPLAINT:  Chief Complaint  Patient presents with  . Follow-up    Oncology History   Chief Complaint/Problem List  1.  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 Status post Cytoxan, Adriamycin, followed by high dose chemotherapy and stem cell support.  Status post radiation therapy. 1996 2.  Was on Tamoxifen therapy.  3.  Progressive disease with the left supraclavicular lymph node positive on needle aspiration.  4.  Tamoxifen has been discontinued.  5.  Ovarian ablation with Lupron, Femara and radiation to the ovaries starting in December 2003. 6.  On Femara  7.  Rising tumor markers.  CT scan is positive for liver metastases.  And lung metastasesBiopsies positive for metastatic disease (liver biopsy) consistent with breast primary. November, 2014 [place] and progesterone receptor positive.  HER-2 receptor negative.. 8.  Abraxene starting  from September 03, 2013.  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  No abdominal pain.  No nausea.  No  vomiting.  No diarrhea.  No bony pains  REVIEW OF SYSTEMS:   Gen. status: Patient is alert and oriented not any acute distress.  Moderately obese. HEENT: No difficulty swallowing no soreness in the mouth.  Lungs: No cough or shortness of breath.  Cardiac: No chest pain or shortness of breath.  GI: No nausea no vomiting no diarrhea no abdominal pain no rectal bleeding.  GU: No dysuria or hematuria skin: No rash.  6 musculoskeletal system no bony pain.  Joint pains particularly in the knee.  Neurological system no headache or dizziness.  Lower extremity no edema  As per HPI. Otherwise, a complete review of systems is negatve.  PAST MEDICAL HISTORY: Past Medical History  Diagnosis Date  . Arthritis   . Diabetes mellitus without complication   . Colon polyps   . GERD (gastroesophageal reflux disease)   . Cancer 2002     LEFT MASTECTOMY  . Breast cancer, female 67    PAST SURGICAL HISTORY: Past Surgical History  Procedure Laterality Date  . Mastectomy Left 2002  . Colonoscopy  [id number]    DR.[email or web address]  . Foot surgery Right   . Gastric endoscopy   2002  . Bone marrow transplant    . Cholecystectomy  1995  . Sigmoidoscopy  2013    FAMILY HISTORY Family History  Problem Relation Age of Onset  . Colon cancer Sister   . COPD Mother     GYNECOLOGIC HISTORY:  No LMP recorded. Patient is postmenopausal.     ADVANCED DIRECTIVES:  HEALTH MAINTENANCE: History  Substance Use Topics  . Smoking status: Never Smoker   . Smokeless tobacco: Never Used  . Alcohol Use: No     Colonoscopy:  PAP:  Bone density:  Lipid panel:  Allergies  Allergen Reactions  . Codeine Hives  . Latex Itching  . Shellfish Allergy Swelling    Current Outpatient Prescriptions  Medication Sig Dispense Refill  . Ascorbic Acid (VITAMIN C) 1000 MG tablet Take 1,000 mg by mouth daily.    . calcium carbonate (OS-CAL - DOSED IN MG OF ELEMENTAL CALCIUM) 1250 MG tablet Take 1 tablet by mouth daily.      Marland Kitchen esomeprazole (NEXIUM) 40 MG packet Take 40 mg by mouth daily before breakfast.    . fentaNYL (DURAGESIC - DOSED MCG/HR) 25 MCG/HR patch Place 1 patch (25 mcg total) onto the skin every 3 (three) days. 10 patch 0  . ondansetron (ZOFRAN) 4 MG tablet     . PARoxetine (PAXIL) 40 MG tablet Take 1 tablet (40 mg total) by mouth every morning. 30 tablet 3  . polyethylene glycol powder (GLYCOLAX/MIRALAX) powder 255 grams one bottle for colonoscopy prep 255 g 0  . spironolactone (ALDACTONE) 100 MG tablet Take by mouth.    . vitamin E 400 UNIT capsule Take 400 Units by mouth daily.    Marland Kitchen ALPRAZolam (XANAX) 0.25 MG tablet Take 1 tablet (0.25 mg total) by mouth every 8 (eight) hours as needed for anxiety. 30 tablet 3  . Calcium Carb-Cholecalciferol 600-400 MG-UNIT TABS Take by mouth.    . docusate sodium (COLACE) 100 MG capsule Take by mouth.    . esomeprazole (NEXIUM) 40 MG capsule Take by mouth.    . ferrous sulfate 325 (65 FE) MG tablet Take by mouth.    . fexofenadine (ALLEGRA) 180 MG tablet Take 180 mg by mouth daily.    . furosemide (LASIX) 20 MG tablet Take 20 mg by mouth daily.  4  . glipiZIDE (GLUCOTROL) 5 MG tablet   0  . letrozole (FEMARA) 2.5 MG tablet Take 2.5 mg by mouth daily.    Marland Kitchen lidocaine-prilocaine (EMLA) cream   3  . meloxicam (MOBIC) 15 MG tablet Take 15 mg by mouth daily.    . metFORMIN (GLUCOPHAGE) 500 MG tablet Take 500 mg by mouth 2 (two) times daily with a meal.    . oxyCODONE-acetaminophen (PERCOCET/ROXICET) 5-325 MG per tablet   0  . potassium chloride (K-DUR) 10 MEQ tablet Take 1 tablet (10 mEq total) by mouth 2 (two) times daily. 60 tablet 3  . traMADol (ULTRAM) 50 MG tablet Take 50 mg by mouth every 6 (six) hours as needed for pain.    . vitamin C (ASCORBIC ACID) 500 MG tablet Take by mouth.     No current facility-administered medications for this visit.   Facility-Administered Medications Ordered in Other Visits  Medication Dose Route Frequency Provider Last Rate  Last Dose  . sodium chloride 0.9 % injection 10 mL  10 mL Intracatheter PRN Forest Gleason, MD   10 mL at 02/18/15 1453    OBJECTIVE:  Filed Vitals:   02/18/15 1412  BP: 133/84  Pulse: 91  Temp: 96.5 F (35.8 C)     Body mass index is 37.05 kg/(m^2).    ECOG FS:1 - Symptomatic but completely ambulatory  PHYSICAL EXAM: GENERAL:  Well developed, well nourished, sitting comfortably in the exam room in no acute distress. MENTAL STATUS:  Alert and oriented to person, place and time. HEAD:alopecia i.  Normocephalic, atraumatic, face symmetric, no Cushingoid features. EYES:  Pupils equal round and reactive to light and accomodation.  No conjunctivitis or scleral icterus. ENT:  Oropharynx clear without lesion.  Tongue normal. Mucous membranes moist.  RESPIRATORY:  Clear to auscultation without rales, wheezes or rhonchi. CARDIOVASCULAR:  Regular rate and rhythm without murmur, rub or gallop. BREAST:  Examination of breasts shows no evidence of palpable mass other chest wall area no evidence of recurrent disease port site within normal limit ABDOMEN:  Soft, non-tender, with active bowel sounds, and no hepatosplenomegaly.  No masses. BACK:  No CVA tenderness.  No tenderness on percussion of the back or rib cage. SKIN:  No rashes, ulcers or lesions. EXTREMITIES: No edema, no skin discoloration or tenderness.  No palpable cords. LYMPH NODES: No palpable cervical, supraclavicular, axillary or inguinal adenopathy  NEUROLOGICAL: Unremarkable. PSYCH:  Appropriate.   LAB RESULTS:  Infusion on 02/18/2015  Component Date Value Ref Range Status  . WBC 02/18/2015 9.9  3.6 - 11.0 K/uL Final   A-LINE DRAW  . RBC 02/18/2015 4.34  3.80 - 5.20 MIL/uL Final  . Hemoglobin 02/18/2015 12.3  12.0 - 16.0 g/dL Final  . HCT 02/18/2015 38.3  35.0 - 47.0 % Final  . MCV 02/18/2015 88.2  80.0 - 100.0 fL Final  . MCH 02/18/2015 28.4  26.0 - 34.0 pg Final  . MCHC 02/18/2015 32.3  32.0 - 36.0 g/dL Final  . RDW  02/18/2015 17.9* 11.5 - 14.5 % Final  . Platelets 02/18/2015 65* 150 - 440 K/uL Final  . Neutrophils Relative % 02/18/2015 81%   Final  . Neutro Abs 02/18/2015 8.0* 1.4 - 6.5 K/uL Final  . Lymphocytes Relative 02/18/2015 12%   Final  . Lymphs Abs 02/18/2015 1.2  1.0 - 3.6 K/uL Final  . Monocytes Relative 02/18/2015 5%   Final  . Monocytes Absolute 02/18/2015 0.5  0.2 - 0.9 K/uL Final  . Eosinophils Relative 02/18/2015 1%   Final  . Eosinophils Absolute 02/18/2015 0.1  0 - 0.7 K/uL Final  . Basophils Relative 02/18/2015 1%   Final  . Basophils Absolute 02/18/2015 0.1  0 - 0.1 K/uL Final  . Sodium 02/18/2015 133* 135 - 145 mmol/L Final  . Potassium 02/18/2015 3.8  3.5 - 5.1 mmol/L Final  . Chloride 02/18/2015 99* 101 - 111 mmol/L Final  . CO2 02/18/2015 31  22 - 32 mmol/L Final  . Glucose, Bld 02/18/2015 200* 65 - 99 mg/dL Final  . BUN 02/18/2015 8  6 - 20 mg/dL Final  . Creatinine, Ser 02/18/2015 0.82  0.44 - 1.00 mg/dL Final  . Calcium 02/18/2015 8.5* 8.9 - 10.3 mg/dL Final  . Total Protein 02/18/2015 6.1* 6.5 - 8.1 g/dL Final  . Albumin 02/18/2015 3.7  3.5 - 5.0 g/dL Final  . AST 02/18/2015 19  15 - 41 U/L Final  . ALT 02/18/2015 16  14 - 54 U/L Final  . Alkaline Phosphatase 02/18/2015 84  38 - 126 U/L Final  . Total Bilirubin 02/18/2015 0.6  0.3 - 1.2 mg/dL Final  . GFR calc non Af Amer 02/18/2015 >60  >60 mL/min Final  . GFR calc Af Amer 02/18/2015 >60  >60 mL/min Final   Comment: (NOTE) The eGFR has been calculated using the CKD EPI equation. This calculation has not been validated in all clinical situations. eGFR's persistently <60 mL/min signify possible Chronic Kidney Disease.   Georgiann Hahn gap 02/18/2015 3* 5 - 15 Final  Hospital Outpatient Visit on 02/18/2015  Component Date Value Ref Range Status  . Glucose-Capillary 02/18/2015 138* 70 - 99 mg/dL Final  Hospital Outpatient Visit on 02/17/2015  Component Date Value Ref Range Status  . Glucose-Capillary 02/17/2015 131* 70  - 99 mg/dL Final    Lab Results  Component Value Date   LABCA2 44.6* 02/04/2015   No results found for: CA199 No results found for: CEA No results found for: PSA No results found for: CA125   STUDIES: Nm Pet Image Restag (ps) Skull Base To Thigh  02/18/2015   CLINICAL DATA:  Subsequent treatment strategy for breast cancer.  EXAM: NUCLEAR MEDICINE PET SKULL BASE TO THIGH  TECHNIQUE: 11.9 mCi F-18 FDG was injected intravenously. Full-ring PET imaging was performed from the skull base to thigh after the radiotracer. CT data was obtained and used for attenuation correction and anatomic localization.  FASTING BLOOD GLUCOSE:  Value: 138 mg/dl  COMPARISON:  12/23/2014  FINDINGS: NECK  No hypermetabolic lymph nodes in the neck.  CHEST  Previous left mastectomy and left axillary nodal dissection. No hypermetabolic mediastinal or hilar nodes. No suspicious pulmonary nodules on the CT scan.  ABDOMEN/PELVIS  Heterogeneous radiotracer activity is identified throughout the diffuse irregular and lobular appearing liver. Within the lateral segment of left lobe of liver there is a single area of increased uptake above background liver activity which has an SUV max equal to 5.5, image number 119. No corresponding lesion identified on the CT images.  No abnormal hypermetabolic activity within the pancreas, adrenal glands, or spleen. Splenomegaly is again noted.  SKELETON  On the CT portion of the examination there is continued improved appearance of multi focal sclerotic bone metastasis. Focal area of increased uptake with subtle increased sclerosis on CT images is noted within the posterior left this measures approximately 1.6 cm and has an SUV max equal to 4.3. When compared with CT from 12/17/2013 this lesion is smaller and less conspicuous.  IMPRESSION: 1. Again noted is a diffusely heterogeneous and lobular appearing liver compatible with treated liver metastasis. No abnormal uptake within the recently described  enhancing lesion in dome of liver which likely represents a benign hemangioma. 2. Within the left hepatic lobe there is a small focus of increased radiotracer uptake above background liver activity. No corresponding anatomic abnormality noted on the CT images. Cannot rule out metabolically active liver metastasis and attention on followup imaging is recommended. Alternatively, a contrast enhanced MRI may provide a more sensitive and specific assessment of the liver. 3. Overall interval improvement an previously demonstrated sclerotic bone metastasis. There is a focus of increased uptake within the left iliac bone compatible with bone metastasis. This is compatible with metabolically active tumor. However, the CT appearance appears improved from previous examinations. 4. Splenomegaly.   Electronically Signed   By: Kerby Moors M.D.   On: 02/18/2015 11:05    ASSESSMENT: Stage IV carcinoma breast previous multiple chemotherapy programs patient is responding to it ERIBULIN.  PET scan shows stable disease tumor markers are slightly elevated.  MEDICAL DECISION MAKING:  PET scan has been reviewed independently.  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 She also understands that She can call clinic at any time with any questions, concerns, or complaints.    Breast cancer metastasized to liver   Staging form: Breast, AJCC 7th Edition     Clinical: Stage IV (T2, N3, M1) - Signed by Forest Gleason, MD on 02/18/2015  Forest Gleason, MD   02/21/2015 9:16 AM

## 2015-02-26 ENCOUNTER — Ambulatory Visit: Payer: Managed Care, Other (non HMO) | Admitting: Oncology

## 2015-02-26 ENCOUNTER — Ambulatory Visit: Payer: Managed Care, Other (non HMO)

## 2015-02-26 ENCOUNTER — Other Ambulatory Visit: Payer: Managed Care, Other (non HMO)

## 2015-02-27 ENCOUNTER — Other Ambulatory Visit: Payer: Self-pay | Admitting: *Deleted

## 2015-02-27 DIAGNOSIS — C50919 Malignant neoplasm of unspecified site of unspecified female breast: Secondary | ICD-10-CM

## 2015-02-27 DIAGNOSIS — C787 Secondary malignant neoplasm of liver and intrahepatic bile duct: Principal | ICD-10-CM

## 2015-03-04 ENCOUNTER — Inpatient Hospital Stay: Payer: Managed Care, Other (non HMO)

## 2015-03-04 ENCOUNTER — Inpatient Hospital Stay (HOSPITAL_BASED_OUTPATIENT_CLINIC_OR_DEPARTMENT_OTHER): Payer: Managed Care, Other (non HMO) | Admitting: Oncology

## 2015-03-04 VITALS — BP 126/77 | HR 93 | Temp 95.6°F | Wt 218.7 lb

## 2015-03-04 DIAGNOSIS — C50919 Malignant neoplasm of unspecified site of unspecified female breast: Secondary | ICD-10-CM

## 2015-03-04 DIAGNOSIS — C50912 Malignant neoplasm of unspecified site of left female breast: Secondary | ICD-10-CM

## 2015-03-04 DIAGNOSIS — R161 Splenomegaly, not elsewhere classified: Secondary | ICD-10-CM

## 2015-03-04 DIAGNOSIS — C787 Secondary malignant neoplasm of liver and intrahepatic bile duct: Secondary | ICD-10-CM | POA: Diagnosis not present

## 2015-03-04 DIAGNOSIS — Z418 Encounter for other procedures for purposes other than remedying health state: Secondary | ICD-10-CM

## 2015-03-04 DIAGNOSIS — Z79811 Long term (current) use of aromatase inhibitors: Secondary | ICD-10-CM

## 2015-03-04 DIAGNOSIS — C801 Malignant (primary) neoplasm, unspecified: Secondary | ICD-10-CM

## 2015-03-04 DIAGNOSIS — Z923 Personal history of irradiation: Secondary | ICD-10-CM

## 2015-03-04 DIAGNOSIS — C7951 Secondary malignant neoplasm of bone: Secondary | ICD-10-CM

## 2015-03-04 DIAGNOSIS — Z800 Family history of malignant neoplasm of digestive organs: Secondary | ICD-10-CM

## 2015-03-04 DIAGNOSIS — E119 Type 2 diabetes mellitus without complications: Secondary | ICD-10-CM

## 2015-03-04 DIAGNOSIS — Z79899 Other long term (current) drug therapy: Secondary | ICD-10-CM

## 2015-03-04 DIAGNOSIS — D6959 Other secondary thrombocytopenia: Secondary | ICD-10-CM

## 2015-03-04 DIAGNOSIS — Z5111 Encounter for antineoplastic chemotherapy: Secondary | ICD-10-CM | POA: Diagnosis not present

## 2015-03-04 DIAGNOSIS — M129 Arthropathy, unspecified: Secondary | ICD-10-CM

## 2015-03-04 DIAGNOSIS — Z9221 Personal history of antineoplastic chemotherapy: Secondary | ICD-10-CM

## 2015-03-04 DIAGNOSIS — K219 Gastro-esophageal reflux disease without esophagitis: Secondary | ICD-10-CM

## 2015-03-04 LAB — CBC WITH DIFFERENTIAL/PLATELET
Basophils Absolute: 0 10*3/uL (ref 0–0.1)
Eosinophils Absolute: 0.1 10*3/uL (ref 0–0.7)
HCT: 40.9 % (ref 35.0–47.0)
Hemoglobin: 13.4 g/dL (ref 12.0–16.0)
Lymphs Abs: 1.2 10*3/uL (ref 1.0–3.6)
MCH: 28.8 pg (ref 26.0–34.0)
MCHC: 32.9 g/dL (ref 32.0–36.0)
MCV: 87.4 fL (ref 80.0–100.0)
Monocytes Absolute: 0.5 10*3/uL (ref 0.2–0.9)
Monocytes Relative: 6 %
NEUTROS ABS: 7 10*3/uL — AB (ref 1.4–6.5)
Neutrophils Relative %: 78 %
Platelets: 75 10*3/uL — ABNORMAL LOW (ref 150–440)
RBC: 4.68 MIL/uL (ref 3.80–5.20)
RDW: 17.4 % — ABNORMAL HIGH (ref 11.5–14.5)
WBC: 8.9 10*3/uL (ref 3.6–11.0)

## 2015-03-04 LAB — COMPREHENSIVE METABOLIC PANEL
ALBUMIN: 4.1 g/dL (ref 3.5–5.0)
ALT: 12 U/L — ABNORMAL LOW (ref 14–54)
ANION GAP: 6 (ref 5–15)
AST: 17 U/L (ref 15–41)
Alkaline Phosphatase: 90 U/L (ref 38–126)
BUN: 14 mg/dL (ref 6–20)
CO2: 31 mmol/L (ref 22–32)
Calcium: 9 mg/dL (ref 8.9–10.3)
Chloride: 96 mmol/L — ABNORMAL LOW (ref 101–111)
Creatinine, Ser: 0.76 mg/dL (ref 0.44–1.00)
GFR calc non Af Amer: >60 mL/min (ref >60)
GLUCOSE: 140 mg/dL — AB (ref 65–99)
Potassium: 3.7 mmol/L (ref 3.5–5.1)
SODIUM: 133 mmol/L — AB (ref 135–145)
TOTAL PROTEIN: 7 g/dL (ref 6.5–8.1)
Total Bilirubin: 0.7 mg/dL (ref 0.3–1.2)

## 2015-03-04 MED ORDER — HEPARIN SOD (PORK) LOCK FLUSH 100 UNIT/ML IV SOLN
INTRAVENOUS | Status: AC
Start: 2015-03-04 — End: 2015-03-04
  Filled 2015-03-04: qty 5

## 2015-03-04 MED ORDER — HEPARIN SOD (PORK) LOCK FLUSH 100 UNIT/ML IV SOLN
500.0000 [IU] | Freq: Once | INTRAVENOUS | Status: AC
  Administered 2015-03-04: 500 [IU] via INTRAVENOUS

## 2015-03-04 MED ORDER — ALPRAZOLAM 0.25 MG PO TABS: 0.2500 mg | ORAL_TABLET | Freq: Three times a day (TID) | ORAL | Status: DC | PRN

## 2015-03-04 MED ORDER — OXYCODONE-ACETAMINOPHEN 5-325 MG PO TABS
1.0000 | ORAL_TABLET | ORAL | Status: DC | PRN
Start: 2015-03-04 — End: 2015-04-14

## 2015-03-04 MED ORDER — ONDANSETRON HCL 4 MG PO TABS: 4.0000 mg | ORAL_TABLET | Freq: Four times a day (QID) | ORAL | Status: DC | PRN

## 2015-03-04 NOTE — Progress Notes (Signed)
Patient has never smoked.  Does not have living will.

## 2015-03-05 ENCOUNTER — Other Ambulatory Visit: Payer: Self-pay | Admitting: *Deleted

## 2015-03-05 DIAGNOSIS — C787 Secondary malignant neoplasm of liver and intrahepatic bile duct: Principal | ICD-10-CM

## 2015-03-05 DIAGNOSIS — C50919 Malignant neoplasm of unspecified site of unspecified female breast: Secondary | ICD-10-CM

## 2015-03-05 LAB — CANCER ANTIGEN 27.29: CA 27.29: 35.2 U/mL (ref 0.0–38.6)

## 2015-03-09 ENCOUNTER — Encounter: Payer: Self-pay | Admitting: Oncology

## 2015-03-09 NOTE — Progress Notes (Signed)
[place] @ [facility] [phone]) [phone]  Fax:([phone]) [place] OB: August 14, 1961  MR#: [id number]  IWL#:[id number]  Patient Care Team: Birdie Sons, MD as PCP - General (Family Medicine) Robert Bellow, MD (General Surgery)  CHIEF COMPLAINT:  Chief Complaint  Patient presents with  . Follow-up    Oncology History   Chief Complaint/Problem List  1.  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 Status post Cytoxan, Adriamycin, followed by high dose chemotherapy and stem cell support.  Status post radiation therapy. 1996 2.  Was on Tamoxifen therapy.  3.  Progressive disease with the left supraclavicular lymph node positive on needle aspiration.  4.  Tamoxifen has been discontinued.  5.  Ovarian ablation with Lupron, Femara and radiation to the ovaries starting in December 2003. 6.  On Femara  7.  Rising tumor markers.  CT scan is positive for liver metastases.  And lung metastasesBiopsies positive for metastatic disease (liver biopsy) consistent with breast primary. November, 2014 [place] and progesterone receptor positive.  HER-2 receptor negative.. 8.  Abraxene starting  from September 03, 2013.  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  No abdominal pain.  No nausea.  No  vomiting.  No diarrhea.  No bony pains. Mar 03, 2014 Patient is here for ongoing evaluation complains of epigastric discomfort.  Patient is slightly high tumor markers.  Epigastric discomfort is improved with Zofran.  No nausea.  No vomiting.  Patient has stage IV carcinoma of breast  REVIEW OF SYSTEMS:   Gen. status: Patient is alert and oriented not any acute distress.  Moderately obese. HEENT: No difficulty swallowing no soreness in the mouth.  Lungs: No cough or shortness of breath.  Cardiac: No chest pain or shortness of breath.  GI: No nausea no vomiting no diarrhea no abdominal pain no rectal bleeding.  GU: No dysuria or hematuria skin: No rash.  6 musculoskeletal system no bony pain.  Joint pains particularly in the knee.  Neurological system no headache or dizziness.  Lower extremity no edema  Epigastric discomfort which is persistent dull aching As per HPI. Otherwise, a complete review of systems is negatve.  PAST MEDICAL HISTORY: Past Medical History  Diagnosis Date  . Arthritis   . Diabetes mellitus without complication   . Colon polyps   . GERD (gastroesophageal reflux disease)   . Cancer 2002     LEFT MASTECTOMY  . Breast cancer, female 7    PAST SURGICAL HISTORY: Past Surgical History  Procedure Laterality Date  . Mastectomy Left 2002  . Colonoscopy  [id number]    DR.[email or web address]  . Foot surgery Right   . Gastric endoscopy   2002  . Bone marrow transplant    . Cholecystectomy  1995  .  Sigmoidoscopy  2013    FAMILY HISTORY Family History  Problem Relation Age of Onset  . Colon cancer Sister   . COPD Mother       ADVANCED DIRECTIVES:  Patient does have advanced health care directive     HEALTH MAINTENANCE: History  Substance Use Topics  . Smoking status: Never Smoker   . Smokeless tobacco: Never Used  . Alcohol Use: No      Allergies  Allergen Reactions  . Codeine Hives and Nausea Only    Other Reaction: GI Upset  . Latex Itching and Hives  .  Shellfish Allergy Swelling    Lips    Current Outpatient Prescriptions  Medication Sig Dispense Refill  . ALPRAZolam (XANAX) 0.25 MG tablet Take 1 tablet (0.25 mg total) by mouth every 8 (eight) hours as needed for anxiety. 30 tablet 3  . Ascorbic Acid (VITAMIN C) 1000 MG tablet Take 1,000 mg by mouth daily.    . calcium carbonate (OS-CAL - DOSED IN MG OF ELEMENTAL CALCIUM) 1250 MG tablet Take 1 tablet by mouth daily.    Marland Kitchen esomeprazole (NEXIUM) 40 MG capsule Take 40 mg by mouth 2 (two) times daily before a meal.     . fentaNYL (DURAGESIC - DOSED MCG/HR) 25 MCG/HR patch Place 1 patch (25 mcg total) onto the skin every 3 (three) days. 10 patch 0  . ferrous sulfate 325 (65 FE) MG tablet Take by mouth.    . furosemide (LASIX) 20 MG tablet Take 20 mg by mouth daily.  4  . glipiZIDE (GLUCOTROL) 5 MG tablet   0  . lidocaine-prilocaine (EMLA) cream   3  . ondansetron (ZOFRAN) 4 MG tablet Take 1 tablet (4 mg total) by mouth every 6 (six) hours as needed for nausea or vomiting. 30 tablet 3  . oxyCODONE-acetaminophen (PERCOCET/ROXICET) 5-325 MG per tablet Take 1 tablet by mouth every 4 (four) hours as needed for severe pain. 60 tablet 0  . PARoxetine (PAXIL) 40 MG tablet Take 1 tablet (40 mg total) by mouth every morning. 30 tablet 3  . polyethylene glycol powder (GLYCOLAX/MIRALAX) powder 255 grams one bottle for colonoscopy prep 255 g 0  . potassium chloride (K-DUR) 10 MEQ tablet Take 1 tablet (10 mEq total) by mouth 2 (two) times daily. 60 tablet 3  . spironolactone (ALDACTONE) 100 MG tablet Take by mouth.    . vitamin C (ASCORBIC ACID) 500 MG tablet Take by mouth.    . vitamin E 400 UNIT capsule Take 400 Units by mouth daily.    . Calcium Carb-Cholecalciferol 600-400 MG-UNIT TABS Take by mouth.    . docusate sodium (COLACE) 100 MG capsule Take by mouth.    . esomeprazole (NEXIUM) 40 MG packet Take 40 mg by mouth daily before breakfast.    . fexofenadine (ALLEGRA) 180 MG tablet Take 180 mg by mouth  daily.    Marland Kitchen letrozole (FEMARA) 2.5 MG tablet Take 2.5 mg by mouth daily.    . meloxicam (MOBIC) 15 MG tablet Take 15 mg by mouth daily.    . metFORMIN (GLUCOPHAGE) 500 MG tablet Take 500 mg by mouth 2 (two) times daily with a meal.    . traMADol (ULTRAM) 50 MG tablet Take 50 mg by mouth every 6 (six) hours as needed for pain.     No current facility-administered medications for this visit.   Facility-Administered Medications Ordered in Other Visits  Medication Dose Route Frequency Provider Last Rate Last Dose  . sodium chloride 0.9 %  injection 10 mL  10 mL Intracatheter PRN Forest Gleason, MD   10 mL at 02/18/15 1453    OBJECTIVE:  Filed Vitals:   03/04/15 1423  BP: 126/77  Pulse: 93  Temp: 95.6 F (35.3 C)     Body mass index is 36.39 kg/(m^2).    ECOG FS:1 - Symptomatic but completely ambulatory  PHYSICAL EXAM: GENERAL:  Well developed, well nourished, sitting comfortably in the exam room in no acute distress. MENTAL STATUS:  Alert and oriented to person, place and time. HEAD:alopecia i.  Normocephalic, atraumatic, face symmetric, no Cushingoid features. EYES:  Pupils equal round and reactive to light and accomodation.  No conjunctivitis or scleral icterus. ENT:  Oropharynx clear without lesion.  Tongue normal. Mucous membranes moist.  RESPIRATORY:  Clear to auscultation without rales, wheezes or rhonchi. CARDIOVASCULAR:  Regular rate and rhythm without murmur, rub or gallop. BREAST:  Examination of breasts shows no evidence of palpable mass other chest wall area no evidence of recurrent disease port site within normal limit ABDOMEN:  Soft, non-tender, with active bowel sounds, and no hepatosplenomegaly.  No masses. BACK:  No CVA tenderness.  No tenderness on percussion of the back or rib cage. SKIN:  No rashes, ulcers or lesions. EXTREMITIES: No edema, no skin discoloration or tenderness.  No palpable cords. LYMPH NODES: No palpable cervical, supraclavicular, axillary or inguinal  adenopathy  NEUROLOGICAL: Unremarkable. PSYCH:  Appropriate.   LAB RESULTS:  Infusion on 03/04/2015  Component Date Value Ref Range Status  . WBC 03/04/2015 8.9  3.6 - 11.0 K/uL Final  . RBC 03/04/2015 4.68  3.80 - 5.20 MIL/uL Final  . Hemoglobin 03/04/2015 13.4  12.0 - 16.0 g/dL Final  . HCT 03/04/2015 40.9  35.0 - 47.0 % Final  . MCV 03/04/2015 87.4  80.0 - 100.0 fL Final  . MCH 03/04/2015 28.8  26.0 - 34.0 pg Final  . MCHC 03/04/2015 32.9  32.0 - 36.0 g/dL Final  . RDW 03/04/2015 17.4* 11.5 - 14.5 % Final  . Platelets 03/04/2015 75* 150 - 440 K/uL Final  . Neutrophils Relative % 03/04/2015 78%   Final  . Neutro Abs 03/04/2015 7.0* 1.4 - 6.5 K/uL Final  . Lymphocytes Relative 03/04/2015 14%   Final  . Lymphs Abs 03/04/2015 1.2  1.0 - 3.6 K/uL Final  . Monocytes Relative 03/04/2015 6%   Final  . Monocytes Absolute 03/04/2015 0.5  0.2 - 0.9 K/uL Final  . Eosinophils Relative 03/04/2015 1%   Final  . Eosinophils Absolute 03/04/2015 0.1  0 - 0.7 K/uL Final  . Basophils Relative 03/04/2015 1%   Final  . Basophils Absolute 03/04/2015 0.0  0 - 0.1 K/uL Final  . Sodium 03/04/2015 133* 135 - 145 mmol/L Final  . Potassium 03/04/2015 3.7  3.5 - 5.1 mmol/L Final  . Chloride 03/04/2015 96* 101 - 111 mmol/L Final  . CO2 03/04/2015 31  22 - 32 mmol/L Final  . Glucose, Bld 03/04/2015 140* 65 - 99 mg/dL Final  . BUN 03/04/2015 14  6 - 20 mg/dL Final  . Creatinine, Ser 03/04/2015 0.76  0.44 - 1.00 mg/dL Final  . Calcium 03/04/2015 9.0  8.9 - 10.3 mg/dL Final  . Total Protein 03/04/2015 7.0  6.5 - 8.1 g/dL Final  . Albumin 03/04/2015 4.1  3.5 - 5.0 g/dL Final  . AST 03/04/2015 17  15 - 41 U/L Final  . ALT 03/04/2015 12* 14 - 54 U/L Final  . Alkaline Phosphatase 03/04/2015 90  38 -  126 U/L Final  . Total Bilirubin 03/04/2015 0.7  0.3 - 1.2 mg/dL Final  . GFR calc non Af Amer 03/04/2015 >60  >60 mL/min Final  . GFR calc Af Amer 03/04/2015 >60  >60 mL/min Final   Comment: (NOTE) The eGFR has  been calculated using the CKD EPI equation. This calculation has not been validated in all clinical situations. eGFR's persistently <60 mL/min signify possible Chronic Kidney Disease.   . Anion gap 03/04/2015 6  5 - 15 Final  . CA 27.29 03/04/2015 35.2  0.0 - 38.6 U/mL Final   Comment: (NOTE) [employer] Cent[facility]/ACS methodology Performed At: [facility] [place], [place] [id number] Lindon Romp MD YK:[id number]     Lab Results  Component Value Date   LABCA2 35.2 03/04/2015   No results found for: CA199 No results found for: CEA No results found for: PSA No results found for: CA125   STUDIES: Nm Pet Image Restag (ps) Skull Base To Thigh  02/18/2015   CLINICAL DATA:  Subsequent treatment strategy for breast cancer.  EXAM: NUCLEAR MEDICINE PET SKULL BASE TO THIGH  TECHNIQUE: 11.9 mCi F-18 FDG was injected intravenously. Full-ring PET imaging was performed from the skull base to thigh after the radiotracer. CT data was obtained and used for attenuation correction and anatomic localization.  FASTING BLOOD GLUCOSE:  Value: 138 mg/dl  COMPARISON:  12/23/2014  FINDINGS: NECK  No hypermetabolic lymph nodes in the neck.  CHEST  Previous left mastectomy and left axillary nodal dissection. No hypermetabolic mediastinal or hilar nodes. No suspicious pulmonary nodules on the CT scan.  ABDOMEN/PELVIS  Heterogeneous radiotracer activity is identified throughout the diffuse irregular and lobular appearing liver. Within the lateral segment of left lobe of liver there is a single area of increased uptake above background liver activity which has an SUV max equal to 5.5, image number 119. No corresponding lesion identified on the CT images.  No abnormal hypermetabolic activity within the pancreas, adrenal glands, or spleen. Splenomegaly is again noted.  SKELETON  On the CT portion of the examination there is continued improved appearance of multi focal sclerotic bone metastasis. Focal  area of increased uptake with subtle increased sclerosis on CT images is noted within the posterior left this measures approximately 1.6 cm and has an SUV max equal to 4.3. When compared with CT from 12/17/2013 this lesion is smaller and less conspicuous.  IMPRESSION: 1. Again noted is a diffusely heterogeneous and lobular appearing liver compatible with treated liver metastasis. No abnormal uptake within the recently described enhancing lesion in dome of liver which likely represents a benign hemangioma. 2. Within the left hepatic lobe there is a small focus of increased radiotracer uptake above background liver activity. No corresponding anatomic abnormality noted on the CT images. Cannot rule out metabolically active liver metastasis and attention on followup imaging is recommended. Alternatively, a contrast enhanced MRI may provide a more sensitive and specific assessment of the liver. 3. Overall interval improvement an previously demonstrated sclerotic bone metastasis. There is a focus of increased uptake within the left iliac bone compatible with bone metastasis. This is compatible with metabolically active tumor. However, the CT appearance appears improved from previous examinations. 4. Splenomegaly.   Electronically Signed   By: Kerby Moors M.D.   On: 02/18/2015 11:05    ASSESSMENT: Stage IV carcinoma breast previous multiple chemotherapy programs patient is responding to it ERIBULIN.  PET scan shows stable disease tumor markers are slightly elevated. Mar 04, 2015  Tumor markers are now gradually going down with a value of 35. Epigastric discomfort exact etiology is not clear  MEDICAL DECISION MAKING:  PET scan has been reviewed independently.  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 She also understands that She can call clinic at any time with any questions, concerns, or  complaints.    Breast cancer metastasized to liver   Staging form: Breast, AJCC 7th Edition     Clinical: Stage IV (T2, N3, M1) - Signed by Forest Gleason, MD on 02/18/2015   Forest Gleason, MD   03/09/2015 2:36 PM

## 2015-03-11 ENCOUNTER — Encounter (INDEPENDENT_AMBULATORY_CARE_PROVIDER_SITE_OTHER): Payer: Self-pay

## 2015-03-11 ENCOUNTER — Inpatient Hospital Stay: Payer: Managed Care, Other (non HMO) | Attending: Oncology

## 2015-03-11 VITALS — BP 106/74 | HR 87 | Temp 97.0°F

## 2015-03-11 DIAGNOSIS — C779 Secondary and unspecified malignant neoplasm of lymph node, unspecified: Secondary | ICD-10-CM | POA: Diagnosis not present

## 2015-03-11 DIAGNOSIS — Z800 Family history of malignant neoplasm of digestive organs: Secondary | ICD-10-CM | POA: Diagnosis not present

## 2015-03-11 DIAGNOSIS — D696 Thrombocytopenia, unspecified: Secondary | ICD-10-CM | POA: Insufficient documentation

## 2015-03-11 DIAGNOSIS — C7951 Secondary malignant neoplasm of bone: Secondary | ICD-10-CM | POA: Diagnosis not present

## 2015-03-11 DIAGNOSIS — Z5111 Encounter for antineoplastic chemotherapy: Secondary | ICD-10-CM | POA: Insufficient documentation

## 2015-03-11 DIAGNOSIS — K219 Gastro-esophageal reflux disease without esophagitis: Secondary | ICD-10-CM | POA: Diagnosis not present

## 2015-03-11 DIAGNOSIS — R978 Other abnormal tumor markers: Secondary | ICD-10-CM | POA: Diagnosis not present

## 2015-03-11 DIAGNOSIS — C7800 Secondary malignant neoplasm of unspecified lung: Secondary | ICD-10-CM | POA: Insufficient documentation

## 2015-03-11 DIAGNOSIS — R110 Nausea: Secondary | ICD-10-CM | POA: Insufficient documentation

## 2015-03-11 DIAGNOSIS — Z418 Encounter for other procedures for purposes other than remedying health state: Secondary | ICD-10-CM | POA: Diagnosis not present

## 2015-03-11 DIAGNOSIS — E119 Type 2 diabetes mellitus without complications: Secondary | ICD-10-CM | POA: Diagnosis not present

## 2015-03-11 DIAGNOSIS — Z9012 Acquired absence of left breast and nipple: Secondary | ICD-10-CM | POA: Diagnosis not present

## 2015-03-11 DIAGNOSIS — M129 Arthropathy, unspecified: Secondary | ICD-10-CM | POA: Diagnosis not present

## 2015-03-11 DIAGNOSIS — R161 Splenomegaly, not elsewhere classified: Secondary | ICD-10-CM | POA: Insufficient documentation

## 2015-03-11 DIAGNOSIS — Z79899 Other long term (current) drug therapy: Secondary | ICD-10-CM | POA: Diagnosis not present

## 2015-03-11 DIAGNOSIS — C50919 Malignant neoplasm of unspecified site of unspecified female breast: Secondary | ICD-10-CM | POA: Diagnosis not present

## 2015-03-11 DIAGNOSIS — C787 Secondary malignant neoplasm of liver and intrahepatic bile duct: Secondary | ICD-10-CM | POA: Insufficient documentation

## 2015-03-11 MED ORDER — SODIUM CHLORIDE 0.9 % IV SOLN
1.1000 mg/m2 | Freq: Once | INTRAVENOUS | Status: DC
  Filled 2015-03-11: qty 4.7

## 2015-03-11 MED ORDER — ERIBULIN MESYLATE CHEMO INJECTION 1 MG/2ML
1.1000 mg/m2 | Freq: Once | INTRAVENOUS | Status: AC
  Administered 2015-03-11: 2.35 mg via INTRAVENOUS
  Filled 2015-03-11: qty 4.7

## 2015-03-11 MED ORDER — SODIUM CHLORIDE 0.9 % IV SOLN
Freq: Once | INTRAVENOUS | Status: AC
  Administered 2015-03-11: 15:00:00 via INTRAVENOUS
  Filled 2015-03-11: qty 4

## 2015-03-11 MED ORDER — HEPARIN SOD (PORK) LOCK FLUSH 100 UNIT/ML IV SOLN
500.0000 [IU] | Freq: Once | INTRAVENOUS | Status: AC | PRN
  Administered 2015-03-11: 500 [IU]
  Filled 2015-03-11: qty 5

## 2015-03-11 MED ORDER — SODIUM CHLORIDE 0.9 % IV SOLN
Freq: Once | INTRAVENOUS | Status: AC
  Administered 2015-03-11: 15:00:00 via INTRAVENOUS
  Filled 2015-03-11: qty 1000

## 2015-03-13 ENCOUNTER — Inpatient Hospital Stay: Payer: Managed Care, Other (non HMO)

## 2015-03-13 DIAGNOSIS — C50912 Malignant neoplasm of unspecified site of left female breast: Secondary | ICD-10-CM

## 2015-03-13 DIAGNOSIS — C787 Secondary malignant neoplasm of liver and intrahepatic bile duct: Principal | ICD-10-CM

## 2015-03-13 DIAGNOSIS — Z5111 Encounter for antineoplastic chemotherapy: Secondary | ICD-10-CM | POA: Diagnosis not present

## 2015-03-13 MED ORDER — PEGFILGRASTIM INJECTION 6 MG/0.6ML
6.0000 mg | Freq: Once | SUBCUTANEOUS | Status: AC
  Administered 2015-03-13: 6 mg via SUBCUTANEOUS
  Filled 2015-03-13: qty 0.6

## 2015-03-13 MED ORDER — PEGFILGRASTIM INJECTION 6 MG/0.6ML ~~LOC~~
PREFILLED_SYRINGE | SUBCUTANEOUS | Status: AC
  Filled 2015-03-13: qty 0.6

## 2015-03-30 ENCOUNTER — Inpatient Hospital Stay (HOSPITAL_BASED_OUTPATIENT_CLINIC_OR_DEPARTMENT_OTHER): Payer: Managed Care, Other (non HMO) | Admitting: Oncology

## 2015-03-30 ENCOUNTER — Inpatient Hospital Stay: Payer: Managed Care, Other (non HMO)

## 2015-03-30 ENCOUNTER — Encounter: Payer: Self-pay | Admitting: Oncology

## 2015-03-30 VITALS — BP 113/74 | HR 71 | Temp 96.8°F | Wt 222.0 lb

## 2015-03-30 DIAGNOSIS — Z418 Encounter for other procedures for purposes other than remedying health state: Secondary | ICD-10-CM

## 2015-03-30 DIAGNOSIS — C779 Secondary and unspecified malignant neoplasm of lymph node, unspecified: Secondary | ICD-10-CM

## 2015-03-30 DIAGNOSIS — E119 Type 2 diabetes mellitus without complications: Secondary | ICD-10-CM

## 2015-03-30 DIAGNOSIS — M129 Arthropathy, unspecified: Secondary | ICD-10-CM

## 2015-03-30 DIAGNOSIS — R110 Nausea: Secondary | ICD-10-CM

## 2015-03-30 DIAGNOSIS — C787 Secondary malignant neoplasm of liver and intrahepatic bile duct: Secondary | ICD-10-CM | POA: Diagnosis not present

## 2015-03-30 DIAGNOSIS — C50919 Malignant neoplasm of unspecified site of unspecified female breast: Secondary | ICD-10-CM | POA: Diagnosis not present

## 2015-03-30 DIAGNOSIS — D696 Thrombocytopenia, unspecified: Secondary | ICD-10-CM

## 2015-03-30 DIAGNOSIS — C7951 Secondary malignant neoplasm of bone: Secondary | ICD-10-CM

## 2015-03-30 DIAGNOSIS — K219 Gastro-esophageal reflux disease without esophagitis: Secondary | ICD-10-CM

## 2015-03-30 DIAGNOSIS — C7800 Secondary malignant neoplasm of unspecified lung: Secondary | ICD-10-CM

## 2015-03-30 DIAGNOSIS — Z5111 Encounter for antineoplastic chemotherapy: Secondary | ICD-10-CM | POA: Diagnosis not present

## 2015-03-30 DIAGNOSIS — Z9012 Acquired absence of left breast and nipple: Secondary | ICD-10-CM

## 2015-03-30 DIAGNOSIS — R978 Other abnormal tumor markers: Secondary | ICD-10-CM

## 2015-03-30 DIAGNOSIS — R161 Splenomegaly, not elsewhere classified: Secondary | ICD-10-CM

## 2015-03-30 DIAGNOSIS — Z800 Family history of malignant neoplasm of digestive organs: Secondary | ICD-10-CM

## 2015-03-30 DIAGNOSIS — Z79899 Other long term (current) drug therapy: Secondary | ICD-10-CM

## 2015-03-30 LAB — COMPREHENSIVE METABOLIC PANEL
ALBUMIN: 3.7 g/dL (ref 3.5–5.0)
ALK PHOS: 85 U/L (ref 38–126)
ALT: 15 U/L (ref 14–54)
AST: 18 U/L (ref 15–41)
Anion gap: 5 (ref 5–15)
BUN: 12 mg/dL (ref 6–20)
CALCIUM: 8.5 mg/dL — AB (ref 8.9–10.3)
CO2: 30 mmol/L (ref 22–32)
Chloride: 101 mmol/L (ref 101–111)
Creatinine, Ser: 1.03 mg/dL — ABNORMAL HIGH (ref 0.44–1.00)
GFR calc Af Amer: >60 mL/min (ref >60)
GFR calc non Af Amer: >60 mL/min (ref >60)
GLUCOSE: 143 mg/dL — AB (ref 65–99)
POTASSIUM: 3.8 mmol/L (ref 3.5–5.1)
SODIUM: 136 mmol/L (ref 135–145)
Total Bilirubin: 0.5 mg/dL (ref 0.3–1.2)
Total Protein: 6.1 g/dL — ABNORMAL LOW (ref 6.5–8.1)

## 2015-03-30 LAB — CBC WITH DIFFERENTIAL/PLATELET
BASOS PCT: 2 %
Basophils Absolute: 0.1 10*3/uL (ref 0–0.1)
EOS ABS: 0 10*3/uL (ref 0–0.7)
Eosinophils Relative: 1 %
HEMATOCRIT: 38.2 % (ref 35.0–47.0)
Hemoglobin: 12.4 g/dL (ref 12.0–16.0)
Lymphocytes Relative: 23 %
Lymphs Abs: 1.2 10*3/uL (ref 1.0–3.6)
MCH: 28.7 pg (ref 26.0–34.0)
MCHC: 32.6 g/dL (ref 32.0–36.0)
MCV: 88.1 fL (ref 80.0–100.0)
MONOS PCT: 6 %
Monocytes Absolute: 0.3 10*3/uL (ref 0.2–0.9)
Neutro Abs: 3.7 10*3/uL (ref 1.4–6.5)
Neutrophils Relative %: 68 %
Platelets: 63 10*3/uL — ABNORMAL LOW (ref 150–440)
RBC: 4.34 MIL/uL (ref 3.80–5.20)
RDW: 17.2 % — ABNORMAL HIGH (ref 11.5–14.5)
WBC: 5.4 10*3/uL (ref 3.6–11.0)

## 2015-03-30 MED ORDER — SODIUM CHLORIDE 0.9 % IV SOLN
Freq: Once | INTRAVENOUS | Status: AC
  Administered 2015-03-30: 15:00:00 via INTRAVENOUS
  Filled 2015-03-30: qty 1000

## 2015-03-30 MED ORDER — SODIUM CHLORIDE 0.9 % IV SOLN
Freq: Once | INTRAVENOUS | Status: AC
  Administered 2015-03-30: 15:00:00 via INTRAVENOUS
  Filled 2015-03-30: qty 4

## 2015-03-30 MED ORDER — ALPRAZOLAM 0.25 MG PO TABS: 0.2500 mg | ORAL_TABLET | Freq: Three times a day (TID) | ORAL | Status: DC | PRN

## 2015-03-30 MED ORDER — PEGFILGRASTIM 6 MG/0.6ML ~~LOC~~ PSKT
6.0000 mg | PREFILLED_SYRINGE | Freq: Once | SUBCUTANEOUS | Status: AC
  Administered 2015-03-30: 6 mg via SUBCUTANEOUS
  Filled 2015-03-30: qty 0.6

## 2015-03-30 MED ORDER — ERIBULIN MESYLATE CHEMO INJECTION 1 MG/2ML
1.1000 mg/m2 | Freq: Once | INTRAVENOUS | Status: AC
  Administered 2015-03-30: 2.35 mg via INTRAVENOUS
  Filled 2015-03-30: qty 4.7

## 2015-03-30 MED ORDER — HEPARIN SOD (PORK) LOCK FLUSH 100 UNIT/ML IV SOLN
500.0000 [IU] | Freq: Once | INTRAVENOUS | Status: AC | PRN
  Administered 2015-03-30: 500 [IU]
  Filled 2015-03-30: qty 5

## 2015-03-30 MED ORDER — FENTANYL 25 MCG/HR TD PT72: 25.0000 ug | MEDICATED_PATCH | TRANSDERMAL | Status: DC

## 2015-03-30 NOTE — Progress Notes (Signed)
Pt does not have living will.  Patient never smoked.  States she has had vomiting several times in the past week - more profound on Saturday.

## 2015-03-31 LAB — CANCER ANTIGEN 27.29: CA 27.29: 23 U/mL (ref 0.0–38.6)

## 2015-04-01 ENCOUNTER — Telehealth: Payer: Self-pay | Admitting: *Deleted

## 2015-04-01 NOTE — Telephone Encounter (Signed)
Mr Kantor called back and I explained we have to wait to see how she does. He stated he understands and that he doesn't think she got any of it because the catheter was curved and she does not have a mark for injection. He will call back if she has any problems

## 2015-04-01 NOTE — Telephone Encounter (Signed)
Per Dr Oliva Bustard, will have to wait 2 weeks unsure how much drug she got. Attempted to call Jamey Back, left message to return my call

## 2015-04-04 ENCOUNTER — Encounter: Payer: Self-pay | Admitting: Oncology

## 2015-04-04 NOTE — Progress Notes (Signed)
[place] @ [facility] [phone]  Fax:([phone]) [place] OB: 25-Feb-1961  MR#: [id number]  FUX#:[id number]  Patient Care Team: Birdie Sons, MD as PCP - General (Family Medicine) Robert Bellow, MD (General Surgery)  CHIEF COMPLAINT:  Chief Complaint  Patient presents with  . Follow-up    Oncology History   Chief Complaint/Problem List  1.  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 Status post Cytoxan, Adriamycin, followed by high dose chemotherapy and stem cell support.  Status post radiation therapy. 1996 2.  Was on Tamoxifen therapy.  3.  Progressive disease with the left supraclavicular lymph node positive on needle aspiration.  4.  Tamoxifen has been discontinued.  5.  Ovarian ablation with Lupron, Femara and radiation to the ovaries starting in December 2003. 6.  On Femara  7.  Rising tumor markers.  CT scan is positive for liver metastases.  And lung metastasesBiopsies positive for metastatic disease (liver biopsy) consistent with breast primary. November, 2014 [place] and progesterone receptor positive.  HER-2 receptor negative.. 8.  Abraxene starting  from September 03, 2013.  9.Patient was taken off Abraxane because of progressive side effect and started on ERIBULIN April 08, 2014     Breast cancer metastasized to liver   08/28/2013 Initial Diagnosis Breast cancer metastasized to liver    Oncology Flowsheet 02/18/2015 02/19/2015 03/11/2015 03/13/2015 03/30/2015  Day, Cycle Day 1, Cycle 1 - Day 8, Cycle 1 - Day 1, Cycle 2  dexamethasone (DECADRON) IV [ 10 mg ] - [ 10 mg ] - [ 10 mg ]  eriBULin mesylate (HALAVEN) IV 1.1 mg/m2 - 1.1 mg/m2 - 1.1 mg/m2  ondansetron (ZOFRAN) IV [ 8 mg ] - [ 8 mg ] - [ 8 mg ]  pegfilgrastim (NEULASTA ONPRO KIT) [place] - - - - 6 mg  pegfilgrastim (NEULASTA)  - 6 mg - 6 mg -    INTERVAL HISTORY: 54 year old lady who has a 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  No abdominal pain.  No nausea.  No vomiting.  No diarrhea.  No bony pains. Mar 04, 2015 Patient is here for ongoing evaluation complains of epigastric discomfort.  Patient is slightly high tumor markers.  Epigastric discomfort is improved with Zofran.  No nausea.  No vomiting.  Patient has stage IV carcinoma of breast March 30, 2015 [facility] here for ongoing evaluation.  Guarding stage IV carcinoma of breast.  Patient is ON CHEMO   WITH ERIBULIN .  Tolerating treatment very well.  Intermittent nausea.  Abdominal pain is improved.  Tumor markers shows progressive decline.  Appearing.  No numbness.  REVIEW OF SYSTEMS:   GENERAL:  Feels good.  Active.  No fevers, sweats or weight loss. PERFORMANCE STATUS (ECOG):   HEENT:  No visual changes, runny nose, sore throat, mouth sores or tenderness. Lungs: No shortness of breath or cough.  No hemoptysis. Cardiac:  No chest pain, palpitations, orthopnea, or PND. GI: intermittent  nausea.  No abdominal pain. GU:  No urgency, frequency, dysuria, or hematuria. Musculoskeletal:  No back pain.  No joint pain.  No muscle tenderness. Extremities:  No pain or swelling. Skin:  No rashes or skin changes. Neuro:  No headache, numbness or weakness, balance or coordination issues. Endocrine:  No diabetes, thyroid issues, hot flashes  or night sweats. Psych:  No mood changes, depression or anxiety. Pain:  No focal pain. Review of systems:  All other systems reviewed and found to be negative. Epigastric discomfort which is persistent dull aching As per HPI. Otherwise, a complete review of systems is negatve.  PAST MEDICAL HISTORY: Past Medical History  Diagnosis Date  . Arthritis   . Diabetes mellitus without complication   . Colon polyps   . GERD (gastroesophageal reflux disease)   . Cancer 2002     LEFT MASTECTOMY    . Breast cancer, female 41    PAST SURGICAL HISTORY: Past Surgical History  Procedure Laterality Date  . Mastectomy Left 2002  . Colonoscopy  [id number],[id number]    [email or web address]  . Foot surgery Right   . Gastric endoscopy   2002  . Bone marrow transplant    . Cholecystectomy  1995  . Sigmoidoscopy  2013    FAMILY HISTORY Family History  Problem Relation Age of Onset  . Colon cancer Sister   . COPD Mother       ADVANCED DIRECTIVES:  Patient does   Not have advanced health care directive Patient does not have any advanced healthcare directive. Information has been given.     HEALTH MAINTENANCE: History  Substance Use Topics  . Smoking status: Never Smoker   . Smokeless tobacco: Never Used  . Alcohol Use: No      Allergies  Allergen Reactions  . Codeine Hives and Nausea Only    Other Reaction: GI Upset  . Latex Itching and Hives  . Shellfish Allergy Swelling    Lips    Current Outpatient Prescriptions  Medication Sig Dispense Refill  . ALPRAZolam (XANAX) 0.25 MG tablet Take 1 tablet (0.25 mg total) by mouth every 8 (eight) hours as needed for anxiety. 30 tablet 3  . Ascorbic Acid (VITAMIN C) 1000 MG tablet Take 1,000 mg by mouth daily.    . Calcium Carb-Cholecalciferol 600-400 MG-UNIT TABS Take by mouth.    . calcium carbonate (OS-CAL - DOSED IN MG OF ELEMENTAL CALCIUM) 1250 MG tablet Take 1 tablet by mouth daily.    Marland Kitchen docusate sodium (COLACE) 100 MG capsule Take by mouth.    . esomeprazole (NEXIUM) 40 MG capsule Take 40 mg by mouth 2 (two) times daily before a meal.     . fentaNYL (DURAGESIC - DOSED MCG/HR) 25 MCG/HR patch Place 1 patch (25 mcg total) onto the skin every 3 (three) days. 10 patch 0  . ferrous sulfate 325 (65 FE) MG tablet Take by mouth.    . furosemide (LASIX) 20 MG tablet Take 20 mg by mouth daily.  4  . glipiZIDE (GLUCOTROL) 5 MG tablet   0  . lidocaine-prilocaine (EMLA) cream   3  . ondansetron (ZOFRAN) 4 MG tablet Take 1 tablet (4 mg total) by  mouth every 6 (six) hours as needed for nausea or vomiting. 30 tablet 3  . oxyCODONE-acetaminophen (PERCOCET/ROXICET) 5-325 MG per tablet Take 1 tablet by mouth every 4 (four) hours as needed for severe pain. 60 tablet 0  . PARoxetine (PAXIL) 40 MG tablet Take 1 tablet (40 mg total) by mouth every morning. 30 tablet 3  . polyethylene glycol powder (GLYCOLAX/MIRALAX) powder 255 grams one bottle for colonoscopy prep 255 g 0  . potassium chloride (K-DUR) 10 MEQ tablet Take 1 tablet (10 mEq total) by mouth 2 (two) times daily. 60 tablet 3  . spironolactone (ALDACTONE) 100 MG tablet Take by mouth.    Marland Kitchen  vitamin C (ASCORBIC ACID) 500 MG tablet Take by mouth.    . vitamin E 400 UNIT capsule Take 400 Units by mouth daily.    Marland Kitchen esomeprazole (NEXIUM) 40 MG packet Take 40 mg by mouth daily before breakfast.    . fexofenadine (ALLEGRA) 180 MG tablet Take 180 mg by mouth daily.    Marland Kitchen letrozole (FEMARA) 2.5 MG tablet Take 2.5 mg by mouth daily.    . meloxicam (MOBIC) 15 MG tablet Take 15 mg by mouth daily.    . metFORMIN (GLUCOPHAGE) 500 MG tablet Take 500 mg by mouth 2 (two) times daily with a meal.    . traMADol (ULTRAM) 50 MG tablet Take 50 mg by mouth every 6 (six) hours as needed for pain.     No current facility-administered medications for this visit.   Facility-Administered Medications Ordered in Other Visits  Medication Dose Route Frequency Provider Last Rate Last Dose  . sodium chloride 0.9 % injection 10 mL  10 mL Intracatheter PRN Forest Gleason, MD   10 mL at 02/18/15 1453    OBJECTIVE:  Filed Vitals:   03/30/15 1345  BP: 113/74  Pulse: 71  Temp: 96.8 F (36 C)     Body mass index is 36.94 kg/(m^2).    ECOG FS:1 - Symptomatic but completely ambulatory  PHYSICAL EXAM: GENERAL:  Well developed, well nourished, sitting comfortably in the exam room in no acute distress. MENTAL STATUS:  Alert and oriented to person, place and time. HEAD:alopecia i.  Normocephalic, atraumatic, face symmetric,  no Cushingoid features. EYES:  Pupils equal round and reactive to light and accomodation.  No conjunctivitis or scleral icterus. ENT:  Oropharynx clear without lesion.  Tongue normal. Mucous membranes moist.  RESPIRATORY:  Clear to auscultation without rales, wheezes or rhonchi. CARDIOVASCULAR:  Regular rate and rhythm without murmur, rub or gallop. BREAST:  Examination of breasts shows no evidence of palpable mass other chest wall area no evidence of recurrent disease port site within normal limit ABDOMEN:  Soft, non-tender, with active bowel sounds, and no hepatosplenomegaly.  No masses. BACK:  No CVA tenderness.  No tenderness on percussion of the back or rib cage. SKIN:  No rashes, ulcers or lesions. EXTREMITIES: No edema, no skin discoloration or tenderness.  No palpable cords. LYMPH NODES: No palpable cervical, supraclavicular, axillary or inguinal adenopathy  NEUROLOGICAL: Unremarkable. PSYCH:  Appropriate.   LAB RESULTS:  Infusion on 03/30/2015  Component Date Value Ref Range Status  . CA 27.29 03/30/2015 23.0  0.0 - 38.6 U/mL Final   Comment: (NOTE) [employer]ayer Centaur/ACS methodology Performed At: [facility] [place], [place] [id number] Lindon Romp MD [id number]   . Sodium 03/30/2015 136  135 - 145 mmol/L Final  . Potassium 03/30/2015 3.8  3.5 - 5.1 mmol/L Final  . Chloride 03/30/2015 101  101 - 111 mmol/L Final  . CO2 03/30/2015 30  22 - 32 mmol/L Final  . Glucose, Bld 03/30/2015 143* 65 - 99 mg/dL Final  . BUN 03/30/2015 12  6 - 20 mg/dL Final  . Creatinine, Ser 03/30/2015 1.03* 0.44 - 1.00 mg/dL Final  . Calcium 03/30/2015 8.5* 8.9 - 10.3 mg/dL Final  . Total Protein 03/30/2015 6.1* 6.5 - 8.1 g/dL Final  . Albumin 03/30/2015 3.7  3.5 - 5.0 g/dL Final  . AST 03/30/2015 18  15 - 41 U/L Final  . ALT 03/30/2015 15  14 - 54 U/L Final  . Alkaline Phosphatase 03/30/2015 85  38 - 126  U/L Final  . Total Bilirubin 03/30/2015 0.5  0.3 - 1.2 mg/dL  Final  . GFR calc non Af Amer 03/30/2015 >60  >60 mL/min Final  . GFR calc Af Amer 03/30/2015 >60  >60 mL/min Final   Comment: (NOTE) The eGFR has been calculated using the CKD EPI equation. This calculation has not been validated in all clinical situations. eGFR's persistently <60 mL/min signify possible Chronic Kidney Disease.   . Anion gap 03/30/2015 5  5 - 15 Final  . WBC 03/30/2015 5.4  3.6 - 11.0 K/uL Final  . RBC 03/30/2015 4.34  3.80 - 5.20 MIL/uL Final  . Hemoglobin 03/30/2015 12.4  12.0 - 16.0 g/dL Final  . HCT 03/30/2015 38.2  35.0 - 47.0 % Final  . MCV 03/30/2015 88.1  80.0 - 100.0 fL Final  . MCH 03/30/2015 28.7  26.0 - 34.0 pg Final  . MCHC 03/30/2015 32.6  32.0 - 36.0 g/dL Final  . RDW 03/30/2015 17.2* 11.5 - 14.5 % Final  . Platelets 03/30/2015 63* 150 - 440 K/uL Final  . Neutrophils Relative % 03/30/2015 68   Final  . Neutro Abs 03/30/2015 3.7  1.4 - 6.5 K/uL Final  . Lymphocytes Relative 03/30/2015 23   Final  . Lymphs Abs 03/30/2015 1.2  1.0 - 3.6 K/uL Final  . Monocytes Relative 03/30/2015 6   Final  . Monocytes Absolute 03/30/2015 0.3  0.2 - 0.9 K/uL Final  . Eosinophils Relative 03/30/2015 1   Final  . Eosinophils Absolute 03/30/2015 0.0  0 - 0.7 K/uL Final  . Basophils Relative 03/30/2015 2   Final  . Basophils Absolute 03/30/2015 0.1  0 - 0.1 K/uL Final    Lab Results  Component Value Date   LABCA2 23.0 03/30/2015   No results found for: CA199 No results found for: CEA No results found for: PSA No results found for: CA125   STUDIES: No results found.  ASSESSMENT: Stage IV carcinoma breast previous multiple chemotherapy programs patient is responding to it ERIBULIN.  PET scan shows stable disease tumor markers are slightly elevated. 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 She also understands that She can call clinic at any time with any questions, concerns, or complaints.    Breast cancer metastasized to liver   Staging form: Breast, AJCC 7th Edition     Clinical: Stage IV (T2, N3, M1) - Signed by Forest Gleason, MD on 02/18/2015   Forest Gleason, MD   04/04/2015 7:40 AM

## 2015-04-14 ENCOUNTER — Inpatient Hospital Stay (HOSPITAL_BASED_OUTPATIENT_CLINIC_OR_DEPARTMENT_OTHER): Payer: Managed Care, Other (non HMO) | Admitting: Oncology

## 2015-04-14 ENCOUNTER — Inpatient Hospital Stay: Payer: Managed Care, Other (non HMO) | Attending: Oncology

## 2015-04-14 ENCOUNTER — Inpatient Hospital Stay: Payer: Managed Care, Other (non HMO)

## 2015-04-14 ENCOUNTER — Ambulatory Visit: Payer: Self-pay

## 2015-04-14 ENCOUNTER — Telehealth: Payer: Self-pay

## 2015-04-14 VITALS — BP 111/79 | HR 82 | Temp 95.9°F | Wt 221.8 lb

## 2015-04-14 DIAGNOSIS — C7951 Secondary malignant neoplasm of bone: Secondary | ICD-10-CM

## 2015-04-14 DIAGNOSIS — Z79899 Other long term (current) drug therapy: Secondary | ICD-10-CM | POA: Insufficient documentation

## 2015-04-14 DIAGNOSIS — K7460 Unspecified cirrhosis of liver: Secondary | ICD-10-CM

## 2015-04-14 DIAGNOSIS — Z9012 Acquired absence of left breast and nipple: Secondary | ICD-10-CM | POA: Diagnosis not present

## 2015-04-14 DIAGNOSIS — Z79811 Long term (current) use of aromatase inhibitors: Secondary | ICD-10-CM | POA: Diagnosis not present

## 2015-04-14 DIAGNOSIS — Z5111 Encounter for antineoplastic chemotherapy: Secondary | ICD-10-CM | POA: Diagnosis present

## 2015-04-14 DIAGNOSIS — C787 Secondary malignant neoplasm of liver and intrahepatic bile duct: Principal | ICD-10-CM

## 2015-04-14 DIAGNOSIS — Z9049 Acquired absence of other specified parts of digestive tract: Secondary | ICD-10-CM | POA: Insufficient documentation

## 2015-04-14 DIAGNOSIS — C50919 Malignant neoplasm of unspecified site of unspecified female breast: Secondary | ICD-10-CM

## 2015-04-14 DIAGNOSIS — C7800 Secondary malignant neoplasm of unspecified lung: Secondary | ICD-10-CM | POA: Insufficient documentation

## 2015-04-14 DIAGNOSIS — D6959 Other secondary thrombocytopenia: Secondary | ICD-10-CM

## 2015-04-14 DIAGNOSIS — Z800 Family history of malignant neoplasm of digestive organs: Secondary | ICD-10-CM

## 2015-04-14 DIAGNOSIS — D72829 Elevated white blood cell count, unspecified: Secondary | ICD-10-CM | POA: Insufficient documentation

## 2015-04-14 DIAGNOSIS — E119 Type 2 diabetes mellitus without complications: Secondary | ICD-10-CM | POA: Diagnosis not present

## 2015-04-14 DIAGNOSIS — D731 Hypersplenism: Secondary | ICD-10-CM | POA: Insufficient documentation

## 2015-04-14 DIAGNOSIS — C50912 Malignant neoplasm of unspecified site of left female breast: Secondary | ICD-10-CM

## 2015-04-14 DIAGNOSIS — K219 Gastro-esophageal reflux disease without esophagitis: Secondary | ICD-10-CM | POA: Insufficient documentation

## 2015-04-14 LAB — COMPREHENSIVE METABOLIC PANEL
ALT: 16 U/L (ref 14–54)
AST: 20 U/L (ref 15–41)
Albumin: 3.5 g/dL (ref 3.5–5.0)
Alkaline Phosphatase: 81 U/L (ref 38–126)
Anion gap: 5 (ref 5–15)
BUN: 9 mg/dL (ref 6–20)
CALCIUM: 8.7 mg/dL — AB (ref 8.9–10.3)
CO2: 28 mmol/L (ref 22–32)
Chloride: 102 mmol/L (ref 101–111)
Creatinine, Ser: 0.81 mg/dL (ref 0.44–1.00)
GFR calc Af Amer: >60 mL/min (ref >60)
GFR calc non Af Amer: >60 mL/min (ref >60)
GLUCOSE: 156 mg/dL — AB (ref 65–99)
POTASSIUM: 3.5 mmol/L (ref 3.5–5.1)
Sodium: 135 mmol/L (ref 135–145)
TOTAL PROTEIN: 6 g/dL — AB (ref 6.5–8.1)
Total Bilirubin: 0.7 mg/dL (ref 0.3–1.2)

## 2015-04-14 LAB — CBC WITH DIFFERENTIAL/PLATELET
Basophils Absolute: 0.1 10*3/uL (ref 0–0.1)
Basophils Relative: 2 %
Eosinophils Absolute: 0.1 10*3/uL (ref 0–0.7)
Eosinophils Relative: 2 %
HEMATOCRIT: 39.2 % (ref 35.0–47.0)
Hemoglobin: 12.7 g/dL (ref 12.0–16.0)
Lymphocytes Relative: 29 %
Lymphs Abs: 1 10*3/uL (ref 1.0–3.6)
MCH: 28.3 pg (ref 26.0–34.0)
MCHC: 32.4 g/dL (ref 32.0–36.0)
MCV: 87.3 fL (ref 80.0–100.0)
MONO ABS: 0.4 10*3/uL (ref 0.2–0.9)
MONOS PCT: 12 %
Neutro Abs: 1.9 10*3/uL (ref 1.4–6.5)
Neutrophils Relative %: 55 %
Platelets: 57 10*3/uL — ABNORMAL LOW (ref 150–440)
RBC: 4.49 MIL/uL (ref 3.80–5.20)
RDW: 16.7 % — ABNORMAL HIGH (ref 11.5–14.5)
WBC: 3.4 10*3/uL — ABNORMAL LOW (ref 3.6–11.0)

## 2015-04-14 MED ORDER — ERIBULIN MESYLATE CHEMO INJECTION 1 MG/2ML
1.1000 mg/m2 | Freq: Once | INTRAVENOUS | Status: AC
  Administered 2015-04-14: 2.35 mg via INTRAVENOUS
  Filled 2015-04-14: qty 4.7

## 2015-04-14 MED ORDER — SODIUM CHLORIDE 0.9 % IV SOLN
Freq: Once | INTRAVENOUS | Status: AC
  Administered 2015-04-14: 16:00:00 via INTRAVENOUS
  Filled 2015-04-14: qty 4

## 2015-04-14 MED ORDER — OXYCODONE-ACETAMINOPHEN 5-325 MG PO TABS: 1.0000 | ORAL_TABLET | ORAL | Status: DC | PRN

## 2015-04-14 MED ORDER — SODIUM CHLORIDE 0.9 % IV SOLN
1.1000 mg/m2 | Freq: Once | INTRAVENOUS | Status: DC
  Filled 2015-04-14: qty 4.7

## 2015-04-14 MED ORDER — SODIUM CHLORIDE 0.9 % IV SOLN
Freq: Once | INTRAVENOUS | Status: AC
  Administered 2015-04-14: 16:00:00 via INTRAVENOUS
  Filled 2015-04-14: qty 1000

## 2015-04-14 MED ORDER — HEPARIN SOD (PORK) LOCK FLUSH 100 UNIT/ML IV SOLN
500.0000 [IU] | Freq: Once | INTRAVENOUS | Status: AC | PRN
  Administered 2015-04-14: 500 [IU]
  Filled 2015-04-14: qty 5

## 2015-04-14 NOTE — Progress Notes (Signed)
Patient does not have living will.  Never smoked.  Requesting refill for oxycodone.

## 2015-04-14 NOTE — Telephone Encounter (Signed)
Plt =57, MD ok to proceed with treatment

## 2015-04-15 ENCOUNTER — Inpatient Hospital Stay: Payer: Managed Care, Other (non HMO)

## 2015-04-15 ENCOUNTER — Other Ambulatory Visit: Payer: Self-pay | Admitting: Oncology

## 2015-04-16 ENCOUNTER — Inpatient Hospital Stay: Payer: Managed Care, Other (non HMO)

## 2015-04-16 ENCOUNTER — Ambulatory Visit: Payer: Self-pay

## 2015-04-16 VITALS — BP 122/82 | HR 84 | Temp 96.0°F

## 2015-04-16 DIAGNOSIS — Z5111 Encounter for antineoplastic chemotherapy: Secondary | ICD-10-CM | POA: Diagnosis not present

## 2015-04-16 DIAGNOSIS — C787 Secondary malignant neoplasm of liver and intrahepatic bile duct: Principal | ICD-10-CM

## 2015-04-16 DIAGNOSIS — C50912 Malignant neoplasm of unspecified site of left female breast: Secondary | ICD-10-CM

## 2015-04-16 MED ORDER — PEGFILGRASTIM INJECTION 6 MG/0.6ML
6.0000 mg | Freq: Once | SUBCUTANEOUS | Status: AC
  Administered 2015-04-16: 6 mg via SUBCUTANEOUS
  Filled 2015-04-16: qty 0.6

## 2015-04-17 ENCOUNTER — Encounter: Payer: Self-pay | Admitting: Oncology

## 2015-04-17 NOTE — Progress Notes (Signed)
[place] @ [facility] [phone]  Fax:([phone] [place] OB: 1961-06-16  MR#: [id number]  IWL#:[id number]  Patient Care Team: Birdie Sons, MD as PCP - General (Family Medicine) Robert Bellow, MD (General Surgery)  CHIEF COMPLAINT:  Chief Complaint  Patient presents with  . Follow-up    Oncology History   Chief Complaint/Problem List  1.  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 Status post Cytoxan, Adriamycin, followed by high dose chemotherapy and stem cell support.  Status post radiation therapy. 1996 2.  Was on Tamoxifen therapy.  3.  Progressive disease with the left supraclavicular lymph node positive on needle aspiration.  4.  Tamoxifen has been discontinued.  5.  Ovarian ablation with Lupron, Femara and radiation to the ovaries starting in December 2003. 6.  On Femara  7.  Rising tumor markers.  CT scan is positive for liver metastases.  And lung metastasesBiopsies positive for metastatic disease (liver biopsy) consistent with breast primary. November, 2014 [place] and progesterone receptor positive.  HER-2 receptor negative.. 8.  Abraxene starting  from September 03, 2013.  9.Patient was taken off Abraxane because of progressive side effect and started on ERIBULIN April 08, 2014     Breast cancer metastasized to liver   08/28/2013 Initial Diagnosis Breast cancer metastasized to liver    Oncology Flowsheet 02/18/2015 02/19/2015 03/11/2015 03/13/2015 03/30/2015 04/14/2015 04/16/2015  Day, Cycle Day 1, Cycle 1 - Day 8, Cycle 1 - Day 1, Cycle 2 Day 1, Cycle 3 -  dexamethasone (DECADRON) IV [ 10 mg ] - [ 10 mg ] - [ 10 mg ] [ 10 mg ] -  eriBULin mesylate (HALAVEN) IV 1.1 mg/m2 - 1.1 mg/m2 - 1.1 mg/m2 1.1 mg/m2 -  ondansetron (ZOFRAN) IV [ 8 mg ] - [ 8 mg ] - [ 8 mg ] [ 8 mg ] -  pegfilgrastim (NEULASTA ONPRO KIT) [place] - - - - 6 mg - -  pegfilgrastim (NEULASTA) [place] - 6 mg - 6 mg - - 6 mg    INTERVAL HISTORY: 54 year old lady who has  a 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  No abdominal pain.  No nausea.  No vomiting.  No diarrhea.  No bony pains. Mar 04, 2015 Patient is here for ongoing evaluation complains of epigastric discomfort.  Patient is slightly high tumor markers.  Epigastric discomfort is improved with Zofran.  No nausea.  No vomiting.  Patient has stage IV carcinoma of breast March 30, 2015 [facility] here for ongoing evaluation.  Guarding stage IV carcinoma of breast.  Patient is ON CHEMO   WITH ERIBULIN .  Tolerating treatment very well.  Intermittent nausea.  Abdominal pain is improved.  Tumor markers shows progressive decline.  Appearing.  No numbness. July, 2016 Patient is here for ongoing evaluation and treatment consideration regarding stage IV carcinoma breast.  Gradually patient's condition is improving.  Patient did not like on body injector for Neulasta.  Gradually markers are improving Or chills no fever no nausea no vomiting. Thrombocytopenia continues  REVIEW OF SYSTEMS:   GENERAL:  Feels good.  Active.  No fevers, sweats or weight loss. PERFORMANCE STATUS (ECOG):   HEENT:  No visual changes, runny nose, sore throat, mouth sores or tenderness. Lungs: No shortness of breath or cough.  No hemoptysis. Cardiac:  No chest pain, palpitations, orthopnea, or PND. GI: intermittent  nausea.  No abdominal pain. GU:  No urgency, frequency, dysuria, or hematuria. Musculoskeletal:  No back pain.  No joint pain.  No muscle tenderness. Extremities:  No pain or swelling. Skin:  No rashes or skin changes. Neuro:  No headache, numbness or weakness, balance or coordination issues. Endocrine:  No diabetes, thyroid issues, hot flashes or night sweats. Psych:  No mood changes, depression or anxiety. Pain:  No focal pain. Review of  systems:  All other systems reviewed and found to be negative. Epigastric discomfort which is persistent dull aching As per HPI. Otherwise, a complete review of systems is negatve.  PAST MEDICAL HISTORY: Past Medical History  Diagnosis Date  . Arthritis   . Diabetes mellitus without complication   . Colon polyps   . GERD (gastroesophageal reflux disease)   . Cancer 2002     LEFT MASTECTOMY  . Breast cancer, female 59    PAST SURGICAL HISTORY: Past Surgical History  Procedure Laterality Date  . Mastectomy Left 2002  . Colonoscopy  [id number]    [email or web address]  . Foot surgery Right   . Gastric endoscopy   2002  . Bone marrow transplant    . Cholecystectomy  1995  . Sigmoidoscopy  2013    FAMILY HISTORY Family History  Problem Relation Age of Onset  . Colon cancer Sister   . COPD Mother       ADVANCED DIRECTIVES:  Patient does   Not have advanced health care directive Patient does not have any advanced healthcare directive. Information has been given.     HEALTH MAINTENANCE: History  Substance Use Topics  . Smoking status: Never Smoker   . Smokeless tobacco: Never Used  . Alcohol Use: No      Allergies  Allergen Reactions  . Codeine Hives and Nausea Only    Other Reaction: GI Upset  . Latex Itching and Hives  . Shellfish Allergy Swelling    Lips    Current Outpatient Prescriptions  Medication Sig Dispense Refill  . ALPRAZolam (XANAX) 0.25 MG tablet Take 1 tablet (0.25 mg total) by mouth every 8 (eight) hours as needed for anxiety. 30 tablet 3  . Ascorbic Acid (VITAMIN C) 1000 MG tablet Take 1,000 mg by mouth daily.    . Calcium Carb-Cholecalciferol 600-400 MG-UNIT TABS Take by mouth.    . docusate sodium (COLACE) 100 MG capsule Take by mouth.    . esomeprazole (NEXIUM) 40 MG packet Take 40 mg by mouth daily before breakfast.    . fentaNYL (DURAGESIC - DOSED MCG/HR) 25 MCG/HR patch Place 1 patch (25 mcg total) onto the skin every 3 (three) days. 10 patch  0  . ferrous sulfate 325 (65 FE) MG tablet Take by mouth.    . fexofenadine (ALLEGRA) 180 MG tablet Take 180 mg by mouth daily.    Marland Kitchen glipiZIDE (GLUCOTROL) 5 MG tablet   0  . lidocaine-prilocaine (EMLA) cream   3  . ondansetron (ZOFRAN) 4 MG tablet Take 1 tablet (4 mg total) by mouth every 6 (six) hours as needed for nausea or vomiting. 30 tablet 3  . oxyCODONE-acetaminophen (PERCOCET/ROXICET) 5-325 MG per tablet Take 1 tablet by mouth every 4 (four) hours as needed for severe pain. 60 tablet 0  . PARoxetine (PAXIL) 40 MG tablet Take 1 tablet (40 mg total) by mouth every morning. 30 tablet 3  . polyethylene glycol powder (GLYCOLAX/MIRALAX) powder 255 grams one  bottle for colonoscopy prep 255 g 0  . potassium chloride (K-DUR) 10 MEQ tablet Take 1 tablet (10 mEq total) by mouth 2 (two) times daily. 60 tablet 3  . spironolactone (ALDACTONE) 100 MG tablet Take by mouth.    . traMADol (ULTRAM) 50 MG tablet Take 50 mg by mouth every 6 (six) hours as needed for pain.    . vitamin C (ASCORBIC ACID) 500 MG tablet Take by mouth.    . vitamin E 400 UNIT capsule Take 400 Units by mouth daily.    . calcium carbonate (OS-CAL - DOSED IN MG OF ELEMENTAL CALCIUM) 1250 MG tablet Take 1 tablet by mouth daily.    Marland Kitchen esomeprazole (NEXIUM) 40 MG capsule Take 40 mg by mouth 2 (two) times daily before a meal.     . furosemide (LASIX) 20 MG tablet TAKE 1 TABLET EVERY DAY 30 tablet 4  . letrozole (FEMARA) 2.5 MG tablet Take 2.5 mg by mouth daily.    . meloxicam (MOBIC) 15 MG tablet Take 15 mg by mouth daily.    . metFORMIN (GLUCOPHAGE) 500 MG tablet Take 500 mg by mouth 2 (two) times daily with a meal.     No current facility-administered medications for this visit.   Facility-Administered Medications Ordered in Other Visits  Medication Dose Route Frequency Provider Last Rate Last Dose  . sodium chloride 0.9 % injection 10 mL  10 mL Intracatheter PRN Forest Gleason, MD   10 mL at 02/18/15 1453    OBJECTIVE:  Filed  Vitals:   04/14/15 1510  BP: 111/79  Pulse: 82  Temp: 95.9 F (35.5 C)     Body mass index is 36.91 kg/(m^2).    ECOG FS:1 - Symptomatic but completely ambulatory  PHYSICAL EXAM: GENERAL:  Well developed, well nourished, sitting comfortably in the exam room in no acute distress. MENTAL STATUS:  Alert and oriented to person, place and time. HEAD:alopecia i.  Normocephalic, atraumatic, face symmetric, no Cushingoid features. EYES:  Pupils equal round and reactive to light and accomodation.  No conjunctivitis or scleral icterus. ENT:  Oropharynx clear without lesion.  Tongue normal. Mucous membranes moist.  RESPIRATORY:  Clear to auscultation without rales, wheezes or rhonchi. CARDIOVASCULAR:  Regular rate and rhythm without murmur, rub or gallop. BREAST:  Examination of breasts shows no evidence of palpable mass other chest wall area no evidence of recurrent disease port site within normal limit ABDOMEN:  Soft, non-tender, with active bowel sounds, and no hepatosplenomegaly.  No masses. BACK:  No CVA tenderness.  No tenderness on percussion of the back or rib cage. SKIN:  No rashes, ulcers or lesions. EXTREMITIES: No edema, no skin discoloration or tenderness.  No palpable cords. LYMPH NODES: No palpable cervical, supraclavicular, axillary or inguinal adenopathy  NEUROLOGICAL: Unremarkable. PSYCH:  Appropriate.   LAB RESULTS:  Infusion on 04/14/2015  Component Date Value Ref Range Status  . WBC 04/14/2015 3.4* 3.6 - 11.0 K/uL Final  . RBC 04/14/2015 4.49  3.80 - 5.20 MIL/uL Final  . Hemoglobin 04/14/2015 12.7  12.0 - 16.0 g/dL Final  . HCT 04/14/2015 39.2  35.0 - 47.0 % Final  . MCV 04/14/2015 87.3  80.0 - 100.0 fL Final  . MCH 04/14/2015 28.3  26.0 - 34.0 pg Final  . MCHC 04/14/2015 32.4  32.0 - 36.0 g/dL Final  . RDW 04/14/2015 16.7* 11.5 - 14.5 % Final  . Platelets 04/14/2015 57* 150 - 440 K/uL Final  . Neutrophils Relative % 04/14/2015 55  Final  . Neutro Abs 04/14/2015 1.9   1.4 - 6.5 K/uL Final  . Lymphocytes Relative 04/14/2015 29   Final  . Lymphs Abs 04/14/2015 1.0  1.0 - 3.6 K/uL Final  . Monocytes Relative 04/14/2015 12   Final  . Monocytes Absolute 04/14/2015 0.4  0.2 - 0.9 K/uL Final  . Eosinophils Relative 04/14/2015 2   Final  . Eosinophils Absolute 04/14/2015 0.1  0 - 0.7 K/uL Final  . Basophils Relative 04/14/2015 2   Final  . Basophils Absolute 04/14/2015 0.1  0 - 0.1 K/uL Final  . Sodium 04/14/2015 135  135 - 145 mmol/L Final  . Potassium 04/14/2015 3.5  3.5 - 5.1 mmol/L Final  . Chloride 04/14/2015 102  101 - 111 mmol/L Final  . CO2 04/14/2015 28  22 - 32 mmol/L Final  . Glucose, Bld 04/14/2015 156* 65 - 99 mg/dL Final  . BUN 04/14/2015 9  6 - 20 mg/dL Final  . Creatinine, Ser 04/14/2015 0.81  0.44 - 1.00 mg/dL Final  . Calcium 04/14/2015 8.7* 8.9 - 10.3 mg/dL Final  . Total Protein 04/14/2015 6.0* 6.5 - 8.1 g/dL Final  . Albumin 04/14/2015 3.5  3.5 - 5.0 g/dL Final  . AST 04/14/2015 20  15 - 41 U/L Final  . ALT 04/14/2015 16  14 - 54 U/L Final  . Alkaline Phosphatase 04/14/2015 81  38 - 126 U/L Final  . Total Bilirubin 04/14/2015 0.7  0.3 - 1.2 mg/dL Final  . GFR calc non Af Amer 04/14/2015 >60  >60 mL/min Final  . GFR calc Af Amer 04/14/2015 >60  >60 mL/min Final   Comment: (NOTE) The eGFR has been calculated using the CKD EPI equation. This calculation has not been validated in all clinical situations. eGFR's persistently <60 mL/min signify possible Chronic Kidney Disease.   . Anion gap 04/14/2015 5  5 - 15 Final    Lab Results  Component Value Date   LABCA2 23.0 03/30/2015      ASSESSMENT: Stage IV carcinoma breast previous multiple chemotherapy programs patient is responding to it ERIBULIN.  PET scan shows stable disease tumor markers are slightly elevated. 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  With eribulin  will be continued. Markers show significant improvement   Breast cancer metastasized to liver   Staging form: Breast, AJCC 7th Edition     Clinical: Stage IV (T2, N3, M1) - Signed by Forest Gleason, MD on 02/18/2015   Forest Gleason, MD   04/17/2015 1:24 PM

## 2015-04-23 ENCOUNTER — Telehealth: Payer: Self-pay | Admitting: *Deleted

## 2015-04-23 DIAGNOSIS — C787 Secondary malignant neoplasm of liver and intrahepatic bile duct: Principal | ICD-10-CM

## 2015-04-23 DIAGNOSIS — C50919 Malignant neoplasm of unspecified site of unspecified female breast: Secondary | ICD-10-CM

## 2015-04-23 MED ORDER — SPIRONOLACTONE 100 MG PO TABS: 100.0000 mg | ORAL_TABLET | Freq: Every day | ORAL | Status: DC

## 2015-04-23 NOTE — Telephone Encounter (Signed)
Escribed

## 2015-04-24 ENCOUNTER — Other Ambulatory Visit: Payer: Self-pay | Admitting: *Deleted

## 2015-04-24 DIAGNOSIS — C787 Secondary malignant neoplasm of liver and intrahepatic bile duct: Principal | ICD-10-CM

## 2015-04-24 DIAGNOSIS — C50919 Malignant neoplasm of unspecified site of unspecified female breast: Secondary | ICD-10-CM

## 2015-04-28 ENCOUNTER — Inpatient Hospital Stay (HOSPITAL_BASED_OUTPATIENT_CLINIC_OR_DEPARTMENT_OTHER): Payer: Managed Care, Other (non HMO) | Admitting: Oncology

## 2015-04-28 ENCOUNTER — Inpatient Hospital Stay: Payer: Managed Care, Other (non HMO)

## 2015-04-28 VITALS — BP 140/95 | HR 108 | Temp 96.1°F | Wt 218.5 lb

## 2015-04-28 DIAGNOSIS — D72829 Elevated white blood cell count, unspecified: Secondary | ICD-10-CM

## 2015-04-28 DIAGNOSIS — C50919 Malignant neoplasm of unspecified site of unspecified female breast: Secondary | ICD-10-CM

## 2015-04-28 DIAGNOSIS — E119 Type 2 diabetes mellitus without complications: Secondary | ICD-10-CM

## 2015-04-28 DIAGNOSIS — K7460 Unspecified cirrhosis of liver: Secondary | ICD-10-CM

## 2015-04-28 DIAGNOSIS — C787 Secondary malignant neoplasm of liver and intrahepatic bile duct: Secondary | ICD-10-CM

## 2015-04-28 DIAGNOSIS — Z9012 Acquired absence of left breast and nipple: Secondary | ICD-10-CM

## 2015-04-28 DIAGNOSIS — Z9049 Acquired absence of other specified parts of digestive tract: Secondary | ICD-10-CM

## 2015-04-28 DIAGNOSIS — C7800 Secondary malignant neoplasm of unspecified lung: Secondary | ICD-10-CM

## 2015-04-28 DIAGNOSIS — C7951 Secondary malignant neoplasm of bone: Secondary | ICD-10-CM | POA: Diagnosis not present

## 2015-04-28 DIAGNOSIS — C50912 Malignant neoplasm of unspecified site of left female breast: Secondary | ICD-10-CM

## 2015-04-28 DIAGNOSIS — Z79811 Long term (current) use of aromatase inhibitors: Secondary | ICD-10-CM

## 2015-04-28 DIAGNOSIS — D6959 Other secondary thrombocytopenia: Secondary | ICD-10-CM

## 2015-04-28 DIAGNOSIS — Z79899 Other long term (current) drug therapy: Secondary | ICD-10-CM

## 2015-04-28 DIAGNOSIS — D731 Hypersplenism: Secondary | ICD-10-CM

## 2015-04-28 DIAGNOSIS — Z800 Family history of malignant neoplasm of digestive organs: Secondary | ICD-10-CM

## 2015-04-28 DIAGNOSIS — K219 Gastro-esophageal reflux disease without esophagitis: Secondary | ICD-10-CM

## 2015-04-28 DIAGNOSIS — Z5111 Encounter for antineoplastic chemotherapy: Secondary | ICD-10-CM | POA: Diagnosis not present

## 2015-04-28 LAB — COMPREHENSIVE METABOLIC PANEL
ALBUMIN: 4 g/dL (ref 3.5–5.0)
ALK PHOS: 97 U/L (ref 38–126)
ALT: 14 U/L (ref 14–54)
AST: 23 U/L (ref 15–41)
Anion gap: 8 (ref 5–15)
BUN: 16 mg/dL (ref 6–20)
CALCIUM: 9 mg/dL (ref 8.9–10.3)
CHLORIDE: 99 mmol/L — AB (ref 101–111)
CO2: 29 mmol/L (ref 22–32)
CREATININE: 0.89 mg/dL (ref 0.44–1.00)
GFR calc Af Amer: >60 mL/min (ref >60)
GFR calc non Af Amer: >60 mL/min (ref >60)
Glucose, Bld: 151 mg/dL — ABNORMAL HIGH (ref 65–99)
POTASSIUM: 3.6 mmol/L (ref 3.5–5.1)
SODIUM: 136 mmol/L (ref 135–145)
Total Bilirubin: 1.1 mg/dL (ref 0.3–1.2)
Total Protein: 6.7 g/dL (ref 6.5–8.1)

## 2015-04-28 LAB — CBC WITH DIFFERENTIAL/PLATELET
BASOS PCT: 0 %
Basophils Absolute: 0 10*3/uL (ref 0–0.1)
EOS ABS: 0.1 10*3/uL (ref 0–0.7)
EOS PCT: 1 %
HEMATOCRIT: 42.7 % (ref 35.0–47.0)
HEMOGLOBIN: 13.8 g/dL (ref 12.0–16.0)
Lymphocytes Relative: 11 %
Lymphs Abs: 1.6 10*3/uL (ref 1.0–3.6)
MCH: 28 pg (ref 26.0–34.0)
MCHC: 32.3 g/dL (ref 32.0–36.0)
MCV: 86.8 fL (ref 80.0–100.0)
MONO ABS: 0.6 10*3/uL (ref 0.2–0.9)
Monocytes Relative: 4 %
Neutro Abs: 11.4 10*3/uL — ABNORMAL HIGH (ref 1.4–6.5)
Neutrophils Relative %: 84 %
Platelets: 72 10*3/uL — ABNORMAL LOW (ref 150–440)
RBC: 4.93 MIL/uL (ref 3.80–5.20)
RDW: 16.6 % — ABNORMAL HIGH (ref 11.5–14.5)
WBC: 13.7 10*3/uL — AB (ref 3.6–11.0)

## 2015-04-28 MED ORDER — SODIUM CHLORIDE 0.9 % IV SOLN
Freq: Once | INTRAVENOUS | Status: AC
  Administered 2015-04-28: 16:00:00 via INTRAVENOUS
  Filled 2015-04-28: qty 1000

## 2015-04-28 MED ORDER — HEPARIN SOD (PORK) LOCK FLUSH 100 UNIT/ML IV SOLN
500.0000 [IU] | Freq: Once | INTRAVENOUS | Status: AC
  Administered 2015-04-28: 500 [IU] via INTRAVENOUS
  Filled 2015-04-28: qty 5

## 2015-04-28 MED ORDER — SODIUM CHLORIDE 0.9 % IV SOLN
1.1000 mg/m2 | Freq: Once | INTRAVENOUS | Status: AC
  Administered 2015-04-28: 2.35 mg via INTRAVENOUS
  Filled 2015-04-28: qty 4.7

## 2015-04-28 MED ORDER — SODIUM CHLORIDE 0.9 % IJ SOLN
10.0000 mL | Freq: Once | INTRAMUSCULAR | Status: AC
  Administered 2015-04-28: 10 mL via INTRAVENOUS
  Filled 2015-04-28: qty 10

## 2015-04-28 MED ORDER — SODIUM CHLORIDE 0.9 % IV SOLN
Freq: Once | INTRAVENOUS | Status: AC
  Administered 2015-04-28: 16:00:00 via INTRAVENOUS
  Filled 2015-04-28: qty 4

## 2015-04-28 NOTE — Progress Notes (Signed)
Patient does not have living will.  Never smoked.

## 2015-05-02 ENCOUNTER — Encounter: Payer: Self-pay | Admitting: Oncology

## 2015-05-02 NOTE — Progress Notes (Signed)
[place] @ [facility] [phone]  Fax:([phone] [place] OB: October 24, 1960  MR#: [id number]  IRC#:[id number]  Patient Care Team: Birdie Sons, MD as PCP - General (Family Medicine) Robert Bellow, MD (General Surgery)  CHIEF COMPLAINT:  Chief Complaint  Patient presents with  . Follow-up    Oncology History   Chief Complaint/Problem List  1.  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 Status post Cytoxan, Adriamycin, followed by high dose chemotherapy and stem cell support.  Status post radiation therapy. 1996 2.  Was on Tamoxifen therapy.  3.  Progressive disease with the left supraclavicular lymph node positive on needle aspiration.  4.  Tamoxifen has been discontinued.  5.  Ovarian ablation with Lupron, Femara and radiation to the ovaries starting in December 2003. 6.  On Femara  7.  Rising tumor markers.  CT scan is positive for liver metastases.  And lung metastasesBiopsies positive for metastatic disease (liver biopsy) consistent with breast primary. November, 2014 [place] and progesterone receptor positive.  HER-2 receptor negative.. 8.  Abraxene starting  from September 03, 2013.  9.Patient was taken off Abraxane because of progressive side effect and started on ERIBULIN April 08, 2014     Breast cancer metastasized to liver   08/28/2013 Initial Diagnosis Breast cancer metastasized to liver    Oncology Flowsheet 02/19/2015 03/11/2015 03/13/2015 03/30/2015 04/14/2015 04/16/2015 04/28/2015  Day, Cycle - Day 8, Cycle 1 - Day 1, Cycle 2 Day 1, Cycle 3 - Day 8, Cycle 3  dexamethasone (DECADRON) IV - [ 10 mg ] - [ 10 mg ] [ 10 mg ] - [ 10 mg ]  eriBULin mesylate (HALAVEN) IV - 1.1 mg/m2 - 1.1 mg/m2 1.1 mg/m2 - 1.1 mg/m2  ondansetron (ZOFRAN) IV - [ 8 mg ] - [ 8 mg ] [ 8 mg ] - [ 8 mg ]  pegfilgrastim (NEULASTA ONPRO KIT) [place] - - - 6 mg - - -  pegfilgrastim (NEULASTA) [place] 6 mg - 6 mg - - 6 mg -    INTERVAL HISTORY: 54 year old lady who has  a 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  No abdominal pain.  No nausea.  No vomiting.  No diarrhea.  No bony pains. Mar 04, 2015 Patient is here for ongoing evaluation complains of epigastric discomfort.  Patient is slightly high tumor markers.  Epigastric discomfort is improved with Zofran.  No nausea.  No vomiting.  Patient has stage IV carcinoma of breast March 30, 2015 [facility] here for ongoing evaluation.  Guarding stage IV carcinoma of breast.  Patient is ON CHEMO   WITH ERIBULIN .  Tolerating treatment very well.  Intermittent nausea.  Abdominal pain is improved.  Tumor markers shows progressive decline.  Appearing.  No numbness. .  July, 19th, 2016 Patient is here for ongoing evaluation and treatment consideration.  Abdominal pain is improved.  No nausea no vomiting.  No diarrhea.  Appetite is improved.  Patient also has leukocytosis so Neulasta would not be given today.    REVIEW OF SYSTEMS:   GENERAL:  Feels good.  Active.  No fevers, sweats or weight loss. PERFORMANCE STATUS (ECOG):   HEENT:  No visual changes, runny nose, sore throat, mouth sores or tenderness. Lungs: No shortness of breath or cough.  No hemoptysis.  Cardiac:  No chest pain, palpitations, orthopnea, or PND. GI: intermittent  nausea.  No abdominal pain. GU:  No urgency, frequency, dysuria, or hematuria. Musculoskeletal:  No back pain.  No joint pain.  No muscle tenderness. Extremities:  No pain or swelling. Skin:  No rashes or skin changes. Neuro:  No headache, numbness or weakness, balance or coordination issues. Endocrine:  No diabetes, thyroid issues, hot flashes or night sweats. Psych:  No mood changes, depression or anxiety. Pain:  No focal pain. Review of systems:  All other systems reviewed and found to be  negative. Epigastric discomfort which is persistent dull aching As per HPI. Otherwise, a complete review of systems is negatve.  PAST MEDICAL HISTORY: Past Medical History  Diagnosis Date  . Arthritis   . Diabetes mellitus without complication   . Colon polyps   . GERD (gastroesophageal reflux disease)   . Cancer 2002     LEFT MASTECTOMY  . Breast cancer, female 34    PAST SURGICAL HISTORY: Past Surgical History  Procedure Laterality Date  . Mastectomy Left 2002  . Colonoscopy  8101,7510    [email or web address]  . Foot surgery Right   . Gastric endoscopy   2002  . Bone marrow transplant    . Cholecystectomy  1995  . Sigmoidoscopy  2013    FAMILY HISTORY Family History  Problem Relation Age of Onset  . Colon cancer Sister   . COPD Mother       ADVANCED DIRECTIVES:  Patient does   Not have advanced health care directive Patient does not have any advanced healthcare directive. Information has been given.     HEALTH MAINTENANCE: History  Substance Use Topics  . Smoking status: Never Smoker   . Smokeless tobacco: Never Used  . Alcohol Use: No      Allergies  Allergen Reactions  . Codeine Hives and Nausea Only    Other Reaction: GI Upset  . Latex Itching and Hives  . Shellfish Allergy Swelling    Lips    Current Outpatient Prescriptions  Medication Sig Dispense Refill  . ALPRAZolam (XANAX) 0.25 MG tablet Take 1 tablet (0.25 mg total) by mouth every 8 (eight) hours as needed for anxiety. 30 tablet 3  . Ascorbic Acid (VITAMIN C) 1000 MG tablet Take 1,000 mg by mouth daily.    . Calcium Carb-Cholecalciferol 600-400 MG-UNIT TABS Take by mouth.    . calcium carbonate (OS-CAL - DOSED IN MG OF ELEMENTAL CALCIUM) 1250 MG tablet Take 1 tablet by mouth daily.    Marland Kitchen docusate sodium (COLACE) 100 MG capsule Take by mouth.    . esomeprazole (NEXIUM) 40 MG capsule Take 40 mg by mouth 2 (two) times daily before a meal.     . esomeprazole (NEXIUM) 40 MG packet Take 40 mg by  mouth daily before breakfast.    . fentaNYL (DURAGESIC - DOSED MCG/HR) 25 MCG/HR patch Place 1 patch (25 mcg total) onto the skin every 3 (three) days. 10 patch 0  . ferrous sulfate 325 (65 FE) MG tablet Take by mouth.    . fexofenadine (ALLEGRA) 180 MG tablet Take 180 mg by mouth daily.    . furosemide (LASIX) 20 MG tablet TAKE 1 TABLET EVERY DAY 30 tablet 4  . glipiZIDE (GLUCOTROL) 5 MG tablet   0  . letrozole (FEMARA) 2.5 MG tablet Take 2.5 mg by mouth daily.    Marland Kitchen lidocaine-prilocaine (EMLA) cream   3  . meloxicam (MOBIC) 15 MG tablet Take 15 mg  by mouth daily.    . metFORMIN (GLUCOPHAGE) 500 MG tablet Take 500 mg by mouth 2 (two) times daily with a meal.    . ondansetron (ZOFRAN) 4 MG tablet Take 1 tablet (4 mg total) by mouth every 6 (six) hours as needed for nausea or vomiting. 30 tablet 3  . oxyCODONE-acetaminophen (PERCOCET/ROXICET) 5-325 MG per tablet Take 1 tablet by mouth every 4 (four) hours as needed for severe pain. 60 tablet 0  . PARoxetine (PAXIL) 40 MG tablet Take 1 tablet (40 mg total) by mouth every morning. 30 tablet 3  . polyethylene glycol powder (GLYCOLAX/MIRALAX) powder 255 grams one bottle for colonoscopy prep 255 g 0  . potassium chloride (K-DUR) 10 MEQ tablet Take 1 tablet (10 mEq total) by mouth 2 (two) times daily. 60 tablet 3  . spironolactone (ALDACTONE) 100 MG tablet Take 1 tablet (100 mg total) by mouth daily. 30 tablet 6  . traMADol (ULTRAM) 50 MG tablet Take 50 mg by mouth every 6 (six) hours as needed for pain.    . vitamin C (ASCORBIC ACID) 500 MG tablet Take by mouth.    . vitamin E 400 UNIT capsule Take 400 Units by mouth daily.     No current facility-administered medications for this visit.   Facility-Administered Medications Ordered in Other Visits  Medication Dose Route Frequency Provider Last Rate Last Dose  . sodium chloride 0.9 % injection 10 mL  10 mL Intracatheter PRN Forest Gleason, MD   10 mL at 02/18/15 1453    OBJECTIVE:  Filed Vitals:    04/28/15 1553  BP: 140/95  Pulse: 108  Temp: 96.1 F (35.6 C)     Body mass index is 36.36 kg/(m^2).    ECOG FS:1 - Symptomatic but completely ambulatory  PHYSICAL EXAM: GENERAL:  Well developed, well nourished, sitting comfortably in the exam room in no acute distress. MENTAL STATUS:  Alert and oriented to person, place and time. HEAD:alopecia i.  Normocephalic, atraumatic, face symmetric, no Cushingoid features. EYES:  Pupils equal round and reactive to light and accomodation.  No conjunctivitis or scleral icterus. ENT:  Oropharynx clear without lesion.  Tongue normal. Mucous membranes moist.  RESPIRATORY:  Clear to auscultation without rales, wheezes or rhonchi. CARDIOVASCULAR:  Regular rate and rhythm without murmur, rub or gallop. BREAST:  Examination of breasts shows no evidence of palpable mass other chest wall area no evidence of recurrent disease port site within normal limit ABDOMEN:  Soft, non-tender, with active bowel sounds, and no hepatosplenomegaly.  No masses. BACK:  No CVA tenderness.  No tenderness on percussion of the back or rib cage. SKIN:  No rashes, ulcers or lesions. EXTREMITIES: No edema, no skin discoloration or tenderness.  No palpable cords. LYMPH NODES: No palpable cervical, supraclavicular, axillary or inguinal adenopathy  NEUROLOGICAL: Unremarkable. PSYCH:  Appropriate.   LAB RESULTS:  Infusion on 04/28/2015  Component Date Value Ref Range Status  . WBC 04/28/2015 13.7* 3.6 - 11.0 K/uL Final  . RBC 04/28/2015 4.93  3.80 - 5.20 MIL/uL Final  . Hemoglobin 04/28/2015 13.8  12.0 - 16.0 g/dL Final  . HCT 04/28/2015 42.7  35.0 - 47.0 % Final  . MCV 04/28/2015 86.8  80.0 - 100.0 fL Final  . MCH 04/28/2015 28.0  26.0 - 34.0 pg Final  . MCHC 04/28/2015 32.3  32.0 - 36.0 g/dL Final  . RDW 04/28/2015 16.6* 11.5 - 14.5 % Final  . Platelets 04/28/2015 72* 150 - 440 K/uL Final  . Neutrophils Relative %  04/28/2015 84   Final  . Neutro Abs 04/28/2015 11.4* 1.4 -  6.5 K/uL Final  . Lymphocytes Relative 04/28/2015 11   Final  . Lymphs Abs 04/28/2015 1.6  1.0 - 3.6 K/uL Final  . Monocytes Relative 04/28/2015 4   Final  . Monocytes Absolute 04/28/2015 0.6  0.2 - 0.9 K/uL Final  . Eosinophils Relative 04/28/2015 1   Final  . Eosinophils Absolute 04/28/2015 0.1  0 - 0.7 K/uL Final  . Basophils Relative 04/28/2015 0   Final  . Basophils Absolute 04/28/2015 0.0  0 - 0.1 K/uL Final  . Sodium 04/28/2015 136  135 - 145 mmol/L Final  . Potassium 04/28/2015 3.6  3.5 - 5.1 mmol/L Final  . Chloride 04/28/2015 99* 101 - 111 mmol/L Final  . CO2 04/28/2015 29  22 - 32 mmol/L Final  . Glucose, Bld 04/28/2015 151* 65 - 99 mg/dL Final  . BUN 04/28/2015 16  6 - 20 mg/dL Final  . Creatinine, Ser 04/28/2015 0.89  0.44 - 1.00 mg/dL Final  . Calcium 04/28/2015 9.0  8.9 - 10.3 mg/dL Final  . Total Protein 04/28/2015 6.7  6.5 - 8.1 g/dL Final  . Albumin 04/28/2015 4.0  3.5 - 5.0 g/dL Final  . AST 04/28/2015 23  15 - 41 U/L Final  . ALT 04/28/2015 14  14 - 54 U/L Final  . Alkaline Phosphatase 04/28/2015 97  38 - 126 U/L Final  . Total Bilirubin 04/28/2015 1.1  0.3 - 1.2 mg/dL Final  . GFR calc non Af Amer 04/28/2015 >60  >60 mL/min Final  . GFR calc Af Amer 04/28/2015 >60  >60 mL/min Final   Comment: (NOTE) The eGFR has been calculated using the CKD EPI equation. This calculation has not been validated in all clinical situations. eGFR's persistently <60 mL/min signify possible Chronic Kidney Disease.   . Anion gap 04/28/2015 8  5 - 15 Final    Lab Results  Component Value Date   LABCA2 23.0 03/30/2015      ASSESSMENT: Stage IV carcinoma breast previous multiple chemotherapy programs patient is responding to it ERIBULIN.  PET scan shows stable disease tumor markers are slightly elevated. 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  Markers are slowly declining another CT scan would be done restaging   chemotherapy.  With eribulin  will be continued. Markers show significant improvement   Breast cancer metastasized to liver   Staging form: Breast, AJCC 7th Edition     Clinical: Stage IV (T2, N3, M1) - Signed by Forest Gleason, MD on 02/18/2015   Forest Gleason, MD   05/02/2015 7:10 AM

## 2015-05-04 ENCOUNTER — Other Ambulatory Visit: Payer: Self-pay | Admitting: Oncology

## 2015-05-12 ENCOUNTER — Other Ambulatory Visit: Payer: Self-pay | Admitting: Oncology

## 2015-05-12 ENCOUNTER — Inpatient Hospital Stay (HOSPITAL_BASED_OUTPATIENT_CLINIC_OR_DEPARTMENT_OTHER): Payer: Managed Care, Other (non HMO) | Admitting: Oncology

## 2015-05-12 ENCOUNTER — Inpatient Hospital Stay: Payer: Managed Care, Other (non HMO)

## 2015-05-12 ENCOUNTER — Inpatient Hospital Stay: Payer: Managed Care, Other (non HMO) | Attending: Oncology

## 2015-05-12 ENCOUNTER — Encounter: Payer: Self-pay | Admitting: Oncology

## 2015-05-12 VITALS — BP 124/85 | HR 77 | Temp 96.9°F | Wt 214.3 lb

## 2015-05-12 DIAGNOSIS — C787 Secondary malignant neoplasm of liver and intrahepatic bile duct: Secondary | ICD-10-CM | POA: Insufficient documentation

## 2015-05-12 DIAGNOSIS — Z79811 Long term (current) use of aromatase inhibitors: Secondary | ICD-10-CM | POA: Diagnosis not present

## 2015-05-12 DIAGNOSIS — J029 Acute pharyngitis, unspecified: Secondary | ICD-10-CM | POA: Diagnosis not present

## 2015-05-12 DIAGNOSIS — Z86010 Personal history of colonic polyps: Secondary | ICD-10-CM

## 2015-05-12 DIAGNOSIS — R110 Nausea: Secondary | ICD-10-CM | POA: Diagnosis not present

## 2015-05-12 DIAGNOSIS — D6959 Other secondary thrombocytopenia: Secondary | ICD-10-CM | POA: Diagnosis not present

## 2015-05-12 DIAGNOSIS — E119 Type 2 diabetes mellitus without complications: Secondary | ICD-10-CM | POA: Diagnosis not present

## 2015-05-12 DIAGNOSIS — C50919 Malignant neoplasm of unspecified site of unspecified female breast: Secondary | ICD-10-CM

## 2015-05-12 DIAGNOSIS — C7951 Secondary malignant neoplasm of bone: Secondary | ICD-10-CM

## 2015-05-12 DIAGNOSIS — D731 Hypersplenism: Secondary | ICD-10-CM

## 2015-05-12 DIAGNOSIS — Z9012 Acquired absence of left breast and nipple: Secondary | ICD-10-CM

## 2015-05-12 DIAGNOSIS — K219 Gastro-esophageal reflux disease without esophagitis: Secondary | ICD-10-CM | POA: Diagnosis not present

## 2015-05-12 DIAGNOSIS — R112 Nausea with vomiting, unspecified: Secondary | ICD-10-CM | POA: Insufficient documentation

## 2015-05-12 DIAGNOSIS — C50912 Malignant neoplasm of unspecified site of left female breast: Secondary | ICD-10-CM | POA: Insufficient documentation

## 2015-05-12 DIAGNOSIS — R509 Fever, unspecified: Secondary | ICD-10-CM | POA: Diagnosis not present

## 2015-05-12 DIAGNOSIS — R531 Weakness: Secondary | ICD-10-CM | POA: Insufficient documentation

## 2015-05-12 DIAGNOSIS — Z170 Estrogen receptor positive status [ER+]: Secondary | ICD-10-CM | POA: Insufficient documentation

## 2015-05-12 DIAGNOSIS — R5383 Other fatigue: Secondary | ICD-10-CM | POA: Insufficient documentation

## 2015-05-12 DIAGNOSIS — K7460 Unspecified cirrhosis of liver: Secondary | ICD-10-CM | POA: Diagnosis not present

## 2015-05-12 DIAGNOSIS — Z79899 Other long term (current) drug therapy: Secondary | ICD-10-CM

## 2015-05-12 DIAGNOSIS — Z800 Family history of malignant neoplasm of digestive organs: Secondary | ICD-10-CM | POA: Insufficient documentation

## 2015-05-12 DIAGNOSIS — M129 Arthropathy, unspecified: Secondary | ICD-10-CM | POA: Insufficient documentation

## 2015-05-12 DIAGNOSIS — Z5111 Encounter for antineoplastic chemotherapy: Secondary | ICD-10-CM | POA: Diagnosis not present

## 2015-05-12 DIAGNOSIS — C7800 Secondary malignant neoplasm of unspecified lung: Secondary | ICD-10-CM | POA: Diagnosis not present

## 2015-05-12 LAB — COMPREHENSIVE METABOLIC PANEL
ALBUMIN: 4.1 g/dL (ref 3.5–5.0)
ALK PHOS: 91 U/L (ref 38–126)
ALT: 15 U/L (ref 14–54)
AST: 19 U/L (ref 15–41)
Anion gap: 8 (ref 5–15)
BUN: 12 mg/dL (ref 6–20)
CO2: 28 mmol/L (ref 22–32)
Calcium: 8.9 mg/dL (ref 8.9–10.3)
Chloride: 99 mmol/L — ABNORMAL LOW (ref 101–111)
Creatinine, Ser: 0.85 mg/dL (ref 0.44–1.00)
GFR calc Af Amer: >60 mL/min (ref >60)
GFR calc non Af Amer: >60 mL/min (ref >60)
GLUCOSE: 165 mg/dL — AB (ref 65–99)
Potassium: 4.1 mmol/L (ref 3.5–5.1)
Sodium: 135 mmol/L (ref 135–145)
Total Bilirubin: 1.1 mg/dL (ref 0.3–1.2)
Total Protein: 6.9 g/dL (ref 6.5–8.1)

## 2015-05-12 LAB — CBC WITH DIFFERENTIAL/PLATELET
BASOS ABS: 0.1 10*3/uL (ref 0–0.1)
Basophils Relative: 2 %
EOS ABS: 0.1 10*3/uL (ref 0–0.7)
Eosinophils Relative: 3 %
HCT: 42.1 % (ref 35.0–47.0)
Hemoglobin: 14.3 g/dL (ref 12.0–16.0)
LYMPHS ABS: 1 10*3/uL (ref 1.0–3.6)
Lymphocytes Relative: 29 %
MCH: 28.8 pg (ref 26.0–34.0)
MCHC: 33.9 g/dL (ref 32.0–36.0)
MCV: 85 fL (ref 80.0–100.0)
MONOS PCT: 17 %
Monocytes Absolute: 0.6 10*3/uL (ref 0.2–0.9)
Neutro Abs: 1.7 10*3/uL (ref 1.4–6.5)
Neutrophils Relative %: 49 %
Platelets: 76 10*3/uL — ABNORMAL LOW (ref 150–440)
RBC: 4.95 MIL/uL (ref 3.80–5.20)
RDW: 16.9 % — AB (ref 11.5–14.5)
WBC: 3.3 10*3/uL — ABNORMAL LOW (ref 3.6–11.0)

## 2015-05-12 MED ORDER — HEPARIN SOD (PORK) LOCK FLUSH 100 UNIT/ML IV SOLN
500.0000 [IU] | Freq: Once | INTRAVENOUS | Status: AC
  Administered 2015-05-12: 500 [IU] via INTRAVENOUS
  Filled 2015-05-12: qty 5

## 2015-05-12 MED ORDER — FENTANYL 25 MCG/HR TD PT72: 25.0000 ug | MEDICATED_PATCH | TRANSDERMAL | Status: DC

## 2015-05-12 MED ORDER — ERIBULIN MESYLATE CHEMO INJECTION 1 MG/2ML
2.3500 mg | Freq: Once | INTRAVENOUS | Status: AC
  Administered 2015-05-12: 2.35 mg via INTRAVENOUS
  Filled 2015-05-12: qty 4.7

## 2015-05-12 MED ORDER — SODIUM CHLORIDE 0.9 % IJ SOLN
10.0000 mL | INTRAMUSCULAR | Status: DC | PRN
  Administered 2015-05-12: 10 mL via INTRAVENOUS
  Filled 2015-05-12: qty 10

## 2015-05-12 MED ORDER — SODIUM CHLORIDE 0.9 % IV SOLN
Freq: Once | INTRAVENOUS | Status: AC
  Administered 2015-05-12: 15:00:00 via INTRAVENOUS
  Filled 2015-05-12: qty 4

## 2015-05-12 MED ORDER — SODIUM CHLORIDE 0.9 % IV SOLN
1.1000 mg/m2 | Freq: Once | INTRAVENOUS | Status: DC
  Filled 2015-05-12: qty 4.7

## 2015-05-12 MED ORDER — SODIUM CHLORIDE 0.9 % IV SOLN
Freq: Once | INTRAVENOUS | Status: AC
  Administered 2015-05-12: 15:00:00 via INTRAVENOUS
  Filled 2015-05-12: qty 1000

## 2015-05-12 NOTE — Progress Notes (Signed)
Patient does not have living will.  Never smoked.  Patient states she has feeling of a "sour stomach".  Does not eat well.  Further complains of nausea. Requesting refill for Fentanyl.

## 2015-05-12 NOTE — Progress Notes (Signed)
   05/12/15 1330  Clinical Encounter Type  Visited With Patient and family together  Visit Type Initial  Provided pastoral support, presence and compassion to patient and her friend in the cancer center.  [place] [phone]

## 2015-05-13 ENCOUNTER — Encounter: Payer: Self-pay | Admitting: Oncology

## 2015-05-13 LAB — CANCER ANTIGEN 27.29: CA 27.29: 29.4 U/mL (ref 0.0–38.6)

## 2015-05-13 NOTE — Progress Notes (Signed)
[place] @ [facility] [phone]  Fax:([phone]) [place] [facility]: [birth year]  MR#: [id number]  FAO#:[id number]  Patient Care Team: Birdie Sons, MD as PCP - General (Family Medicine) Robert Bellow, MD (General Surgery)  CHIEF COMPLAINT:  Chief Complaint  Patient presents with  . Follow-up    Oncology History   Chief Complaint/Problem List  1.  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 Status post Cytoxan, Adriamycin, followed by high dose chemotherapy and stem cell support.  Status post radiation therapy. 1996 2.  Was on Tamoxifen therapy.  3.  Progressive disease with the left supraclavicular lymph node positive on needle aspiration.  4.  Tamoxifen has been discontinued.  5.  Ovarian ablation with Lupron, Femara and radiation to the ovaries starting in December 2003. 6.  On Femara  7.  Rising tumor markers.  CT scan is positive for liver metastases.  And lung metastasesBiopsies positive for metastatic disease (liver biopsy) consistent with breast primary. November, 2014 [place] and progesterone receptor positive.  HER-2 receptor negative.. 8.  Abraxene starting  from September 03, 2013.  9.Patient was taken off Abraxane because of progressive side effect and started on ERIBULIN April 08, 2014     Breast cancer metastasized to liver   08/28/2013 Initial Diagnosis Breast cancer metastasized to liver    Oncology Flowsheet 03/11/2015 03/13/2015 03/30/2015 04/14/2015 04/16/2015 04/28/2015 05/12/2015  Day, Cycle Day 8, Cycle 1 - Day 1, Cycle 2 Day 1, Cycle 3 - Day 8, Cycle 3 Day 1, Cycle 4  dexamethasone (DECADRON) IV [ 10 mg ] - [ 10 mg ] [ 10 mg ] - [ 10 mg ] [ 10 mg ]  eriBULin mesylate (HALAVEN) IV 1.1 mg/m2 - 1.1 mg/m2 1.1 mg/m2 - 1.1 mg/m2 2.35 mg  ondansetron (ZOFRAN) IV [ 8 mg ] - [ 8 mg ] [ 8 mg ] - [ 8 mg ] [ 8 mg ]  pegfilgrastim (NEULASTA ONPRO KIT) McIntire - - 6 mg - - - -  pegfilgrastim (NEULASTA) Traver - 6 mg - - 6 mg - -    INTERVAL  HISTORY: 54 year old lady who has a 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  No abdominal pain.  No nausea.  No vomiting.  No diarrhea.  No bony pains. Mar 04, 2015 Patient is here for ongoing evaluation complains of epigastric discomfort.  Patient is slightly high tumor markers.  Epigastric discomfort is improved with Zofran.  No nausea.  No vomiting.  Patient has stage IV carcinoma of breast March 30, 2015 [facility] here for ongoing evaluation.  Guarding stage IV carcinoma of breast.  Patient is ON CHEMO   WITH ERIBULIN .  Tolerating treatment very well.  Intermittent nausea.  Abdominal pain is improved.  Tumor markers shows progressive decline.  Appearing.  No numbness.. May 12, 2015 Patient is here for ongoing evaluation and continuation of treatment.  Having increasing and persistent nausea for last few days.  Vomited twice.  No abdominal pain.  No discomfort.  No diarrhea. Appetite has been stable. Marland Kitchen  Here for continuation of treatment   REVIEW OF SYSTEMS:   GENERAL:  Feels good.  Active.  No fevers, sweats or weight loss. PERFORMANCE STATUS (ECOG):   HEENT:  No visual changes, runny nose, sore throat, mouth sores  or tenderness. Lungs: No shortness of breath or cough.  No hemoptysis. Cardiac:  No chest pain, palpitations, orthopnea, or PND. GI: intermittent  nausea.  No abdominal pain. He should not complaints of nausea and vomiting for last 2 days.  Epigastric discomfort.  Taking Nexium once a day GU:  No urgency, frequency, dysuria, or hematuria. Musculoskeletal:  No back pain.  No joint pain.  No muscle tenderness. Extremities:  No pain or swelling. Skin:  No rashes or skin changes. Neuro:  No headache, numbness or weakness, balance or coordination issues. Endocrine:  No diabetes, thyroid issues, hot  flashes or night sweats. Psych:  No mood changes, depression or anxiety. Pain:  No focal pain. Review of systems:  All other systems reviewed and found to be negative. Epigastric discomfort which is persistent dull aching As per HPI. Otherwise, a complete review of systems is negatve.  PAST MEDICAL HISTORY: Past Medical History  Diagnosis Date  . Arthritis   . Diabetes mellitus without complication   . Colon polyps   . GERD (gastroesophageal reflux disease)   . Cancer 2002     LEFT MASTECTOMY  . Breast cancer, female 31    PAST SURGICAL HISTORY: Past Surgical History  Procedure Laterality Date  . Mastectomy Left 2002  . Colonoscopy  [id number]    DR.[email or web address]  . Foot surgery Right   . Gastric endoscopy   2002  . Bone marrow transplant    . Cholecystectomy  1995  . Sigmoidoscopy  2013    FAMILY HISTORY Family History  Problem Relation Age of Onset  . Colon cancer Sister   . COPD Mother       ADVANCED DIRECTIVES:  Patient does   Not have advanced health care directive Patient does not have any advanced healthcare directive. Information has been given.     HEALTH MAINTENANCE: History  Substance Use Topics  . Smoking status: Never Smoker   . Smokeless tobacco: Never Used  . Alcohol Use: No      Allergies  Allergen Reactions  . Codeine Hives and Nausea Only    Other Reaction: GI Upset  . Latex Itching and Hives  . Shellfish Allergy Swelling    Lips    Current Outpatient Prescriptions  Medication Sig Dispense Refill  . ALPRAZolam (XANAX) 0.25 MG tablet Take 1 tablet (0.25 mg total) by mouth every 8 (eight) hours as needed for anxiety. 30 tablet 3  . Ascorbic Acid (VITAMIN C) 1000 MG tablet Take 1,000 mg by mouth daily.    . Calcium Carb-Cholecalciferol 600-400 MG-UNIT TABS Take by mouth.    . esomeprazole (NEXIUM) 40 MG packet Take 40 mg by mouth daily before breakfast.    . fentaNYL (DURAGESIC - DOSED MCG/HR) 25 MCG/HR patch Place 1 patch (25 mcg  total) onto the skin every 3 (three) days. 10 patch 0  . ferrous sulfate 325 (65 FE) MG tablet Take by mouth.    . fexofenadine (ALLEGRA) 180 MG tablet Take 180 mg by mouth daily.    . furosemide (LASIX) 20 MG tablet TAKE 1 TABLET EVERY DAY 30 tablet 4  . glipiZIDE (GLUCOTROL) 5 MG tablet   0  . lidocaine-prilocaine (EMLA) cream   3  . ondansetron (ZOFRAN) 4 MG tablet TAKE 1 TABLET (4 MG TOTAL) BY MOUTH EVERY 6 (SIX) HOURS AS NEEDED FOR NAUSEA OR VOMITING. 30 tablet 3  . oxyCODONE-acetaminophen (PERCOCET/ROXICET) 5-325 MG per tablet Take 1 tablet by mouth every 4 (four) hours as needed for severe pain.  60 tablet 0  . PARoxetine (PAXIL) 40 MG tablet TAKE 1 TABLET ONCE DAILY 30 tablet 3  . polyethylene glycol powder (GLYCOLAX/MIRALAX) powder 255 grams one bottle for colonoscopy prep 255 g 0  . potassium chloride (K-DUR) 10 MEQ tablet Take 1 tablet (10 mEq total) by mouth 2 (two) times daily. 60 tablet 3  . spironolactone (ALDACTONE) 100 MG tablet Take 1 tablet (100 mg total) by mouth daily. 30 tablet 6  . vitamin C (ASCORBIC ACID) 500 MG tablet Take by mouth.    . vitamin E 400 UNIT capsule Take 400 Units by mouth daily.    . calcium carbonate (OS-CAL - DOSED IN MG OF ELEMENTAL CALCIUM) 1250 MG tablet Take 1 tablet by mouth daily.    Marland Kitchen docusate sodium (COLACE) 100 MG capsule Take by mouth.    . esomeprazole (NEXIUM) 40 MG capsule Take 40 mg by mouth 2 (two) times daily before a meal.     . letrozole (FEMARA) 2.5 MG tablet Take 2.5 mg by mouth daily.    . meloxicam (MOBIC) 15 MG tablet Take 15 mg by mouth daily.    . metFORMIN (GLUCOPHAGE) 500 MG tablet Take 500 mg by mouth 2 (two) times daily with a meal.    . traMADol (ULTRAM) 50 MG tablet Take 50 mg by mouth every 6 (six) hours as needed for pain.     No current facility-administered medications for this visit.   Facility-Administered Medications Ordered in Other Visits  Medication Dose Route Frequency Provider Last Rate Last Dose  . sodium  chloride 0.9 % injection 10 mL  10 mL Intracatheter PRN Forest Gleason, MD   10 mL at 02/18/15 1453    OBJECTIVE:  Filed Vitals:   05/12/15 1408  BP: 124/85  Pulse: 77  Temp: 96.9 F (36.1 C)     Body mass index is 35.66 kg/(m^2).    ECOG FS:1 - Symptomatic but completely ambulatory  PHYSICAL EXAM: GENERAL:  Well developed, well nourished, sitting comfortably in the exam room in no acute distress. MENTAL STATUS:  Alert and oriented to person, place and time. HEAD:alopecia i.  Normocephalic, atraumatic, face symmetric, no Cushingoid features. EYES:  Pupils equal round and reactive to light and accomodation.  No conjunctivitis or scleral icterus. ENT:  Oropharynx clear without lesion.  Tongue normal. Mucous membranes moist.  RESPIRATORY:  Clear to auscultation without rales, wheezes or rhonchi. CARDIOVASCULAR:  Regular rate and rhythm without murmur, rub or gallop. Abdomen: Tenderness in epigastric area.  Liver is not palpable.  No ascites. Oh palpable mass BACK:  No CVA tenderness.  No tenderness on percussion of the back or rib cage. SKIN:  No rashes, ulcers or lesions. EXTREMITIES: No edema, no skin discoloration or tenderness.  No palpable cords. LYMPH NODES: No palpable cervical, supraclavicular, axillary or inguinal adenopathy  NEUROLOGICAL: Unremarkable. PSYCH:  Appropriate.   LAB RESULTS:  Infusion on 05/12/2015  Component Date Value Ref Range Status  . WBC 05/12/2015 3.3* 3.6 - 11.0 K/uL Final   A-LINE DRAW  . RBC 05/12/2015 4.95  3.80 - 5.20 MIL/uL Final  . Hemoglobin 05/12/2015 14.3  12.0 - 16.0 g/dL Final  . HCT 05/12/2015 42.1  35.0 - 47.0 % Final  . MCV 05/12/2015 85.0  80.0 - 100.0 fL Final  . MCH 05/12/2015 28.8  26.0 - 34.0 pg Final  . MCHC 05/12/2015 33.9  32.0 - 36.0 g/dL Final  . RDW 05/12/2015 16.9* 11.5 - 14.5 % Final  . Platelets 05/12/2015 76*  150 - 440 K/uL Final  . Neutrophils Relative % 05/12/2015 49   Final  . Neutro Abs 05/12/2015 1.7  1.4 - 6.5  K/uL Final  . Lymphocytes Relative 05/12/2015 29   Final  . Lymphs Abs 05/12/2015 1.0  1.0 - 3.6 K/uL Final  . Monocytes Relative 05/12/2015 17   Final  . Monocytes Absolute 05/12/2015 0.6  0.2 - 0.9 K/uL Final  . Eosinophils Relative 05/12/2015 3   Final  . Eosinophils Absolute 05/12/2015 0.1  0 - 0.7 K/uL Final  . Basophils Relative 05/12/2015 2   Final  . Basophils Absolute 05/12/2015 0.1  0 - 0.1 K/uL Final  . Sodium 05/12/2015 135  135 - 145 mmol/L Final  . Potassium 05/12/2015 4.1  3.5 - 5.1 mmol/L Final  . Chloride 05/12/2015 99* 101 - 111 mmol/L Final  . CO2 05/12/2015 28  22 - 32 mmol/L Final  . Glucose, Bld 05/12/2015 165* 65 - 99 mg/dL Final  . BUN 05/12/2015 12  6 - 20 mg/dL Final  . Creatinine, Ser 05/12/2015 0.85  0.44 - 1.00 mg/dL Final  . Calcium 05/12/2015 8.9  8.9 - 10.3 mg/dL Final  . Total Protein 05/12/2015 6.9  6.5 - 8.1 g/dL Final  . Albumin 05/12/2015 4.1  3.5 - 5.0 g/dL Final  . AST 05/12/2015 19  15 - 41 U/L Final  . ALT 05/12/2015 15  14 - 54 U/L Final  . Alkaline Phosphatase 05/12/2015 91  38 - 126 U/L Final  . Total Bilirubin 05/12/2015 1.1  0.3 - 1.2 mg/dL Final  . GFR calc non Af Amer 05/12/2015 >60  >60 mL/min Final  . GFR calc Af Amer 05/12/2015 >60  >60 mL/min Final   Comment: (NOTE) The eGFR has been calculated using the CKD EPI equation. This calculation has not been validated in all clinical situations. eGFR's persistently <60 mL/min signify possible Chronic Kidney Disease.   . Anion gap 05/12/2015 8  5 - 15 Final  . CA 27.29 05/12/2015 29.4  0.0 - 38.6 U/mL Final   Comment: (NOTE) [employer]/ACS methodology Performed At: [facility] [address] [place], [place] [id number] Lindon Romp MD GS:[id number]     Lab Results  Component Value Date   LABCA2 29.4 05/12/2015      ASSESSMENT: Stage IV carcinoma breast previous multiple chemotherapy programs patient is responding to it ERIBULIN.  PET scan shows stable disease  tumor markers are slightly elevated. 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  If nausea and vomiting persist upper endoscopy would be planned to rule out any peptic ulcer disease or any other related problems   Breast cancer metastasized to liver   Staging form: Breast, AJCC 7th Edition     Clinical: Stage IV (T2, N3, M1) - Signed by Forest Gleason, MD on 02/18/2015   Forest Gleason, MD   05/13/2015 7:51 AM

## 2015-05-19 ENCOUNTER — Ambulatory Visit
Admission: RE | Admit: 2015-05-19 | Discharge: 2015-05-19 | Disposition: A | Discharge status: Home / Self Care | Payer: Managed Care, Other (non HMO) | Source: Ambulatory Visit | Attending: Oncology | Admitting: Oncology

## 2015-05-19 DIAGNOSIS — C50919 Malignant neoplasm of unspecified site of unspecified female breast: Secondary | ICD-10-CM | POA: Insufficient documentation

## 2015-05-19 DIAGNOSIS — K766 Portal hypertension: Secondary | ICD-10-CM | POA: Insufficient documentation

## 2015-05-19 DIAGNOSIS — K7460 Unspecified cirrhosis of liver: Secondary | ICD-10-CM | POA: Diagnosis not present

## 2015-05-19 DIAGNOSIS — C787 Secondary malignant neoplasm of liver and intrahepatic bile duct: Secondary | ICD-10-CM | POA: Insufficient documentation

## 2015-05-19 DIAGNOSIS — R188 Other ascites: Secondary | ICD-10-CM | POA: Diagnosis not present

## 2015-05-19 MED ORDER — IOHEXOL 300 MG/ML  SOLN
100.0000 mL | Freq: Once | INTRAMUSCULAR | Status: AC | PRN
  Administered 2015-05-19: 100 mL via INTRAVENOUS

## 2015-05-26 ENCOUNTER — Encounter: Payer: Self-pay | Admitting: Oncology

## 2015-05-26 ENCOUNTER — Inpatient Hospital Stay: Payer: Managed Care, Other (non HMO)

## 2015-05-26 ENCOUNTER — Inpatient Hospital Stay (HOSPITAL_BASED_OUTPATIENT_CLINIC_OR_DEPARTMENT_OTHER): Payer: Managed Care, Other (non HMO) | Admitting: Oncology

## 2015-05-26 VITALS — BP 133/87 | HR 96 | Temp 97.1°F | Resp 18 | Wt 220.0 lb

## 2015-05-26 DIAGNOSIS — C50919 Malignant neoplasm of unspecified site of unspecified female breast: Secondary | ICD-10-CM

## 2015-05-26 DIAGNOSIS — Z79899 Other long term (current) drug therapy: Secondary | ICD-10-CM

## 2015-05-26 DIAGNOSIS — Z170 Estrogen receptor positive status [ER+]: Secondary | ICD-10-CM

## 2015-05-26 DIAGNOSIS — D6959 Other secondary thrombocytopenia: Secondary | ICD-10-CM

## 2015-05-26 DIAGNOSIS — M129 Arthropathy, unspecified: Secondary | ICD-10-CM

## 2015-05-26 DIAGNOSIS — Z9012 Acquired absence of left breast and nipple: Secondary | ICD-10-CM

## 2015-05-26 DIAGNOSIS — C7800 Secondary malignant neoplasm of unspecified lung: Secondary | ICD-10-CM

## 2015-05-26 DIAGNOSIS — Z86010 Personal history of colonic polyps: Secondary | ICD-10-CM

## 2015-05-26 DIAGNOSIS — Z5111 Encounter for antineoplastic chemotherapy: Secondary | ICD-10-CM | POA: Diagnosis not present

## 2015-05-26 DIAGNOSIS — Z79811 Long term (current) use of aromatase inhibitors: Secondary | ICD-10-CM

## 2015-05-26 DIAGNOSIS — C787 Secondary malignant neoplasm of liver and intrahepatic bile duct: Secondary | ICD-10-CM

## 2015-05-26 DIAGNOSIS — E119 Type 2 diabetes mellitus without complications: Secondary | ICD-10-CM

## 2015-05-26 DIAGNOSIS — C7951 Secondary malignant neoplasm of bone: Secondary | ICD-10-CM | POA: Diagnosis not present

## 2015-05-26 DIAGNOSIS — R110 Nausea: Secondary | ICD-10-CM

## 2015-05-26 DIAGNOSIS — C50912 Malignant neoplasm of unspecified site of left female breast: Secondary | ICD-10-CM

## 2015-05-26 DIAGNOSIS — K7460 Unspecified cirrhosis of liver: Secondary | ICD-10-CM

## 2015-05-26 DIAGNOSIS — D731 Hypersplenism: Secondary | ICD-10-CM

## 2015-05-26 DIAGNOSIS — K219 Gastro-esophageal reflux disease without esophagitis: Secondary | ICD-10-CM

## 2015-05-26 DIAGNOSIS — Z800 Family history of malignant neoplasm of digestive organs: Secondary | ICD-10-CM

## 2015-05-26 LAB — CBC WITH DIFFERENTIAL/PLATELET
BASOS ABS: 0 10*3/uL (ref 0–0.1)
Basophils Relative: 1 %
Eosinophils Absolute: 0.1 10*3/uL (ref 0–0.7)
Eosinophils Relative: 2 %
HEMATOCRIT: 40.3 % (ref 35.0–47.0)
Hemoglobin: 13.3 g/dL (ref 12.0–16.0)
LYMPHS PCT: 31 %
Lymphs Abs: 1.1 10*3/uL (ref 1.0–3.6)
MCH: 28.5 pg (ref 26.0–34.0)
MCHC: 33.1 g/dL (ref 32.0–36.0)
MCV: 86.2 fL (ref 80.0–100.0)
Monocytes Absolute: 0.5 10*3/uL (ref 0.2–0.9)
Monocytes Relative: 13 %
NEUTROS ABS: 1.9 10*3/uL (ref 1.4–6.5)
Neutrophils Relative %: 53 %
Platelets: 73 10*3/uL — ABNORMAL LOW (ref 150–440)
RBC: 4.68 MIL/uL (ref 3.80–5.20)
RDW: 17.3 % — ABNORMAL HIGH (ref 11.5–14.5)
WBC: 3.6 10*3/uL (ref 3.6–11.0)

## 2015-05-26 MED ORDER — OXYCODONE-ACETAMINOPHEN 5-325 MG PO TABS: 1.0000 | ORAL_TABLET | ORAL | Status: DC | PRN

## 2015-05-26 MED ORDER — HEPARIN SOD (PORK) LOCK FLUSH 100 UNIT/ML IV SOLN
500.0000 [IU] | Freq: Once | INTRAVENOUS | Status: AC
  Administered 2015-05-26: 500 [IU] via INTRAVENOUS
  Filled 2015-05-26: qty 5

## 2015-05-26 MED ORDER — SODIUM CHLORIDE 0.9 % IV SOLN
Freq: Once | INTRAVENOUS | Status: AC
  Administered 2015-05-26: 15:00:00 via INTRAVENOUS
  Filled 2015-05-26: qty 1000

## 2015-05-26 MED ORDER — ALPRAZOLAM 0.25 MG PO TABS: 0.2500 mg | ORAL_TABLET | Freq: Three times a day (TID) | ORAL | Status: DC | PRN

## 2015-05-26 MED ORDER — SODIUM CHLORIDE 0.9 % IJ SOLN
10.0000 mL | INTRAMUSCULAR | Status: DC | PRN
  Administered 2015-05-26: 10 mL via INTRAVENOUS
  Filled 2015-05-26: qty 10

## 2015-05-26 MED ORDER — SODIUM CHLORIDE 0.9 % IV SOLN
Freq: Once | INTRAVENOUS | Status: AC
  Administered 2015-05-26: 16:00:00 via INTRAVENOUS
  Filled 2015-05-26: qty 4

## 2015-05-26 MED ORDER — SODIUM CHLORIDE 0.9 % IV SOLN
1.1000 mg/m2 | Freq: Once | INTRAVENOUS | Status: AC
  Administered 2015-05-26: 2.35 mg via INTRAVENOUS
  Filled 2015-05-26: qty 4.7

## 2015-05-26 NOTE — Progress Notes (Signed)
Patient does not have living will.  Former smoker. Requesting refills for Oxycodone-Acetaminophen5-325 and Alprazolam 0.25.

## 2015-06-07 ENCOUNTER — Encounter: Payer: Self-pay | Admitting: Oncology

## 2015-06-07 NOTE — Progress Notes (Signed)
[place] @ [facility] [phone]  Fax:([phone]) [place] OB: 08-Aug-1961  MR#: [id number]  RXV#:[id number]  Patient Care Team: Birdie Sons, MD as PCP - General (Family Medicine) Robert Bellow, MD (General Surgery)  CHIEF COMPLAINT:  Chief Complaint  Patient presents with  . Follow-up    Oncology History   Chief Complaint/Problem List  1.  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 Status post Cytoxan, Adriamycin, followed by high dose chemotherapy and stem cell support.  Status post radiation therapy. 1996 2.  Was on Tamoxifen therapy.  3.  Progressive disease with the left supraclavicular lymph node positive on needle aspiration.  4.  Tamoxifen has been discontinued.  5.  Ovarian ablation with Lupron, Femara and radiation to the ovaries starting in December 2003. 6.  On Femara  7.  Rising tumor markers.  CT scan is positive for liver metastases.  And lung metastasesBiopsies positive for metastatic disease (liver biopsy) consistent with breast primary. November, 2014 [place] and progesterone receptor positive.  HER-2 receptor negative.. 8.  Abraxene starting  from September 03, 2013.  9.Patient was taken off Abraxane because of progressive side effect and started on ERIBULIN April 08, 2014     Breast cancer metastasized to liver   08/28/2013 Initial Diagnosis Breast cancer metastasized to liver    Oncology Flowsheet 03/13/2015 03/30/2015 04/14/2015 04/16/2015 04/28/2015 05/12/2015 05/26/2015  Day, Cycle - Day 1, Cycle 2 Day 1, Cycle 3 - Day 8, Cycle 3 Day 1, Cycle 4 Day 8, Cycle 4  dexamethasone (DECADRON) IV - [ 10 mg ] [ 10 mg ] - [ 10 mg ] [ 10 mg ] [ 10 mg ]  eriBULin mesylate (HALAVEN) IV - 1.1 mg/m2 1.1 mg/m2 - 1.1 mg/m2 2.35 mg 1.1 mg/m2  ondansetron (ZOFRAN) IV - [ 8 mg ] [ 8 mg ] - [ 8 mg ] [ 8 mg ] [ 8 mg ]  pegfilgrastim (NEULASTA ONPRO KIT) [place] - 6 mg - - - - -  pegfilgrastim (NEULASTA) [place] 6 mg - - 6 mg - - -    INTERVAL  HISTORY: 53 year old lady who has a 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  No abdominal pain.  No nausea.  No vomiting.  No diarrhea.  No bony pains. Mar 04, 2015 Patient is here for ongoing evaluation complains of epigastric discomfort.  Patient is slightly high tumor markers.  Epigastric discomfort is improved with Zofran.  No nausea.  No vomiting.  Patient has stage IV carcinoma of breast March 30, 2015 [facility] here for ongoing evaluation.  Guarding stage IV carcinoma of breast.  Patient is ON CHEMO   WITH ERIBULIN .  Tolerating treatment very well.  Intermittent nausea.  Abdominal pain is improved.  Tumor markers shows progressive decline.  Appearing.  No numbness.. May 12, 2015 Patient is here for ongoing evaluation and continuation of treatment.  Having increasing and persistent nausea for last few days.  Vomited twice.  No abdominal pain.  No discomfort.  No diarrhea. Appetite has been stable. August, 2016 .  16 th   Patient is here for ongoing evaluation and treatment consideration regarding stage IV carcinoma of breast.  Epigastric discomfort and nausea and vomiting is improved.  Patient's CT scan continues to show improvement tumor marker continues  to shows improvement.  No nausea no vomiting patient continuing on a ERIBULIN  therapy  Here for continuation of treatment   REVIEW OF SYSTEMS:   GENERAL:  Feels good.  Active.  No fevers, sweats or weight loss. PERFORMANCE STATUS (ECOG):   HEENT:  No visual changes, runny nose, sore throat, mouth sores or tenderness. Lungs: No shortness of breath or cough.  No hemoptysis. Cardiac:  No chest pain, palpitations, orthopnea, or PND. GI: intermittent  Nausea  HAS IMPROVED   No abdominal pain. He should not complaints of nausea and vomiting for last 2 days.   Epigastric discomfort.  Taking Nexium once a day GU:  No urgency, frequency, dysuria, or hematuria. Musculoskeletal:  No back pain.  No joint pain.  No muscle tenderness. Extremities:  No pain or swelling. Skin:  No rashes or skin changes. Neuro:  No headache, numbness or weakness, balance or coordination issues. Endocrine:  No diabetes, thyroid issues, hot flashes or night sweats. Psych:  No mood changes, depression or anxiety. Pain:  No focal pain. Review of systems:  All other systems reviewed and found to be negative. Epigastric discomfort which is persistent dull aching As per HPI. Otherwise, a complete review of systems is negatve.  PAST MEDICAL HISTORY: Past Medical History  Diagnosis Date  . Arthritis   . Diabetes mellitus without complication   . Colon polyps   . GERD (gastroesophageal reflux disease)   . Cancer 2002     LEFT MASTECTOMY  . Breast cancer, female 31    PAST SURGICAL HISTORY: Past Surgical History  Procedure Laterality Date  . Mastectomy Left 2002  . Colonoscopy  9030,0923    [email or web address]  . Foot surgery Right   . Gastric endoscopy   2002  . Bone marrow transplant    . Cholecystectomy  1995  . Sigmoidoscopy  2013    FAMILY HISTORY Family History  Problem Relation Age of Onset  . Colon cancer Sister   . COPD Mother       ADVANCED DIRECTIVES:  Patient does   Not have advanced health care directive Patient does not have any advanced healthcare directive. Information has been given.     HEALTH MAINTENANCE: Social History  Substance Use Topics  . Smoking status: Never Smoker   . Smokeless tobacco: Never Used  . Alcohol Use: No      Allergies  Allergen Reactions  . Codeine Hives and Nausea Only    Other Reaction: GI Upset  . Latex Itching and Hives  . Shellfish Allergy Swelling    Lips    Current Outpatient Prescriptions  Medication Sig Dispense Refill  . ALPRAZolam (XANAX) 0.25 MG tablet Take 1 tablet (0.25 mg total) by mouth  every 8 (eight) hours as needed for anxiety. 30 tablet 3  . Ascorbic Acid (VITAMIN C) 1000 MG tablet Take 1,000 mg by mouth daily.    . Calcium Carb-Cholecalciferol 600-400 MG-UNIT TABS Take by mouth.    . calcium carbonate (OS-CAL - DOSED IN MG OF ELEMENTAL CALCIUM) 1250 MG tablet Take 1 tablet by mouth daily.    Marland Kitchen docusate sodium (COLACE) 100 MG capsule Take by mouth.    . esomeprazole (NEXIUM) 40 MG capsule Take 40 mg by mouth 2 (two) times daily before a meal.     . esomeprazole (NEXIUM) 40 MG packet Take 40 mg by mouth daily before breakfast.    . fentaNYL (DURAGESIC - DOSED MCG/HR) 25 MCG/HR patch Place 1 patch (25 mcg total) onto the  skin every 3 (three) days. 10 patch 0  . ferrous sulfate 325 (65 FE) MG tablet Take by mouth.    . fexofenadine (ALLEGRA) 180 MG tablet Take 180 mg by mouth daily.    . furosemide (LASIX) 20 MG tablet TAKE 1 TABLET EVERY DAY 30 tablet 4  . glipiZIDE (GLUCOTROL) 5 MG tablet   0  . letrozole (FEMARA) 2.5 MG tablet Take 2.5 mg by mouth daily.    Marland Kitchen lidocaine-prilocaine (EMLA) cream   3  . meloxicam (MOBIC) 15 MG tablet Take 15 mg by mouth daily.    . metFORMIN (GLUCOPHAGE) 500 MG tablet Take 500 mg by mouth 2 (two) times daily with a meal.    . ondansetron (ZOFRAN) 4 MG tablet TAKE 1 TABLET (4 MG TOTAL) BY MOUTH EVERY 6 (SIX) HOURS AS NEEDED FOR NAUSEA OR VOMITING. 30 tablet 3  . oxyCODONE-acetaminophen (PERCOCET/ROXICET) 5-325 MG per tablet Take 1 tablet by mouth every 4 (four) hours as needed for severe pain. 60 tablet 0  . PARoxetine (PAXIL) 40 MG tablet TAKE 1 TABLET ONCE DAILY 30 tablet 3  . polyethylene glycol powder (GLYCOLAX/MIRALAX) powder 255 grams one bottle for colonoscopy prep 255 g 0  . potassium chloride (K-DUR) 10 MEQ tablet Take 1 tablet (10 mEq total) by mouth 2 (two) times daily. 60 tablet 3  . spironolactone (ALDACTONE) 100 MG tablet Take 1 tablet (100 mg total) by mouth daily. 30 tablet 6  . traMADol (ULTRAM) 50 MG tablet Take 50 mg by mouth  every 6 (six) hours as needed for pain.    . vitamin C (ASCORBIC ACID) 500 MG tablet Take by mouth.    . vitamin E 400 UNIT capsule Take 400 Units by mouth daily.     No current facility-administered medications for this visit.   Facility-Administered Medications Ordered in Other Visits  Medication Dose Route Frequency Provider Last Rate Last Dose  . sodium chloride 0.9 % injection 10 mL  10 mL Intracatheter PRN Forest Gleason, MD   10 mL at 02/18/15 1453    OBJECTIVE:  Filed Vitals:   05/26/15 1427  BP: 133/87  Pulse: 96  Temp: 97.1 F (36.2 C)  Resp: 18     Body mass index is 36.61 kg/(m^2).    ECOG FS:1 - Symptomatic but completely ambulatory  PHYSICAL EXAM: GENERAL:  Well developed, well nourished, sitting comfortably in the exam room in no acute distress. MENTAL STATUS:  Alert and oriented to person, place and time. HEAD:alopecia i.  Normocephalic, atraumatic, face symmetric, no Cushingoid features. EYES:  Pupils equal round and reactive to light and accomodation.  No conjunctivitis or scleral icterus. ENT:  Oropharynx clear without lesion.  Tongue normal. Mucous membranes moist.  RESPIRATORY:  Clear to auscultation without rales, wheezes or rhonchi. CARDIOVASCULAR:  Regular rate and rhythm without murmur, rub or gallop. Abdomen: Tenderness in epigastric area.  Liver is not palpable.  No ascites. Oh palpable mass BACK:  No CVA tenderness.  No tenderness on percussion of the back or rib cage. SKIN:  No rashes, ulcers or lesions. EXTREMITIES: No edema, no skin discoloration or tenderness.  No palpable cords. LYMPH NODES: No palpable cervical, supraclavicular, axillary or inguinal adenopathy  NEUROLOGICAL: Unremarkable. PSYCH:  Appropriate.   LAB RESULTS:  Infusion on 05/26/2015  Component Date Value Ref Range Status  . WBC 05/26/2015 3.6  3.6 - 11.0 K/uL Final  . RBC 05/26/2015 4.68  3.80 - 5.20 MIL/uL Final  . Hemoglobin 05/26/2015 13.3  12.0 -  16.0 g/dL Final  . HCT  05/26/2015 40.3  35.0 - 47.0 % Final  . MCV 05/26/2015 86.2  80.0 - 100.0 fL Final  . MCH 05/26/2015 28.5  26.0 - 34.0 pg Final  . MCHC 05/26/2015 33.1  32.0 - 36.0 g/dL Final  . RDW 05/26/2015 17.3* 11.5 - 14.5 % Final  . Platelets 05/26/2015 73* 150 - 440 K/uL Final  . Neutrophils Relative % 05/26/2015 53   Final  . Neutro Abs 05/26/2015 1.9  1.4 - 6.5 K/uL Final  . Lymphocytes Relative 05/26/2015 31   Final  . Lymphs Abs 05/26/2015 1.1  1.0 - 3.6 K/uL Final  . Monocytes Relative 05/26/2015 13   Final  . Monocytes Absolute 05/26/2015 0.5  0.2 - 0.9 K/uL Final  . Eosinophils Relative 05/26/2015 2   Final  . Eosinophils Absolute 05/26/2015 0.1  0 - 0.7 K/uL Final  . Basophils Relative 05/26/2015 1   Final  . Basophils Absolute 05/26/2015 0.0  0 - 0.1 K/uL Final    Lab Results  Component Value Date   LABCA2 29.4 05/12/2015   IMPRESSION: 1. Similar appearance of the liver. 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 The posterior right hepatic lobe dominant lesion is unchanged in size and may represent a hemangioma, given peripheral nodular enhancement. Portal venous hypertension, including periesophageal ascites. 2. No extrahepatic metastatic disease identified. 3. Possible constipation. No explanation for vomiting.   ASSESSMENT: Stage IV carcinoma breast previous multiple chemotherapy programs patient is responding to it ERIBULIN.  PET scan shows stable disease tumor markers are slightly elevated. 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  Independently.  No evidence of new lesions.  Recent metastases has been stable.  Tumor markers are getting better.  I reviewed CT scan with patient   Breast cancer metastasized to liver   Staging form: Breast, AJCC 7th Edition     Clinical: Stage IV (T2, N3, M1) - Signed by Forest Gleason, MD on 02/18/2015   Forest Gleason, MD   06/07/2015 5:59 PM

## 2015-06-09 ENCOUNTER — Other Ambulatory Visit: Payer: Self-pay | Admitting: *Deleted

## 2015-06-09 ENCOUNTER — Inpatient Hospital Stay: Payer: Managed Care, Other (non HMO)

## 2015-06-09 ENCOUNTER — Inpatient Hospital Stay (HOSPITAL_BASED_OUTPATIENT_CLINIC_OR_DEPARTMENT_OTHER): Payer: Managed Care, Other (non HMO) | Admitting: Oncology

## 2015-06-09 VITALS — BP 113/84 | HR 110 | Temp 97.6°F | Wt 213.2 lb

## 2015-06-09 DIAGNOSIS — C7951 Secondary malignant neoplasm of bone: Secondary | ICD-10-CM

## 2015-06-09 DIAGNOSIS — D6959 Other secondary thrombocytopenia: Secondary | ICD-10-CM

## 2015-06-09 DIAGNOSIS — C787 Secondary malignant neoplasm of liver and intrahepatic bile duct: Secondary | ICD-10-CM

## 2015-06-09 DIAGNOSIS — E119 Type 2 diabetes mellitus without complications: Secondary | ICD-10-CM

## 2015-06-09 DIAGNOSIS — Z800 Family history of malignant neoplasm of digestive organs: Secondary | ICD-10-CM

## 2015-06-09 DIAGNOSIS — Z79811 Long term (current) use of aromatase inhibitors: Secondary | ICD-10-CM

## 2015-06-09 DIAGNOSIS — R5383 Other fatigue: Secondary | ICD-10-CM

## 2015-06-09 DIAGNOSIS — C50912 Malignant neoplasm of unspecified site of left female breast: Secondary | ICD-10-CM | POA: Diagnosis not present

## 2015-06-09 DIAGNOSIS — C50919 Malignant neoplasm of unspecified site of unspecified female breast: Secondary | ICD-10-CM

## 2015-06-09 DIAGNOSIS — C7800 Secondary malignant neoplasm of unspecified lung: Secondary | ICD-10-CM | POA: Diagnosis not present

## 2015-06-09 DIAGNOSIS — R110 Nausea: Secondary | ICD-10-CM

## 2015-06-09 DIAGNOSIS — Z79899 Other long term (current) drug therapy: Secondary | ICD-10-CM

## 2015-06-09 DIAGNOSIS — K219 Gastro-esophageal reflux disease without esophagitis: Secondary | ICD-10-CM

## 2015-06-09 DIAGNOSIS — J029 Acute pharyngitis, unspecified: Secondary | ICD-10-CM

## 2015-06-09 DIAGNOSIS — R509 Fever, unspecified: Secondary | ICD-10-CM

## 2015-06-09 DIAGNOSIS — R112 Nausea with vomiting, unspecified: Secondary | ICD-10-CM

## 2015-06-09 DIAGNOSIS — M129 Arthropathy, unspecified: Secondary | ICD-10-CM

## 2015-06-09 DIAGNOSIS — Z5111 Encounter for antineoplastic chemotherapy: Secondary | ICD-10-CM | POA: Diagnosis not present

## 2015-06-09 DIAGNOSIS — K7460 Unspecified cirrhosis of liver: Secondary | ICD-10-CM

## 2015-06-09 DIAGNOSIS — Z86010 Personal history of colonic polyps: Secondary | ICD-10-CM

## 2015-06-09 DIAGNOSIS — D731 Hypersplenism: Secondary | ICD-10-CM

## 2015-06-09 DIAGNOSIS — C801 Malignant (primary) neoplasm, unspecified: Secondary | ICD-10-CM

## 2015-06-09 DIAGNOSIS — Z9012 Acquired absence of left breast and nipple: Secondary | ICD-10-CM

## 2015-06-09 DIAGNOSIS — R531 Weakness: Secondary | ICD-10-CM

## 2015-06-09 DIAGNOSIS — Z170 Estrogen receptor positive status [ER+]: Secondary | ICD-10-CM

## 2015-06-09 LAB — CBC WITH DIFFERENTIAL/PLATELET
BASOS ABS: 0.1 10*3/uL (ref 0–0.1)
BASOS PCT: 1 %
EOS PCT: 2 %
Eosinophils Absolute: 0.1 10*3/uL (ref 0–0.7)
HEMATOCRIT: 41.9 % (ref 35.0–47.0)
Hemoglobin: 14.1 g/dL (ref 12.0–16.0)
Lymphocytes Relative: 29 %
Lymphs Abs: 1.7 10*3/uL (ref 1.0–3.6)
MCH: 28.4 pg (ref 26.0–34.0)
MCHC: 33.7 g/dL (ref 32.0–36.0)
MCV: 84.4 fL (ref 80.0–100.0)
MONO ABS: 0.6 10*3/uL (ref 0.2–0.9)
MONOS PCT: 10 %
NEUTROS ABS: 3.6 10*3/uL (ref 1.4–6.5)
Neutrophils Relative %: 58 %
PLATELETS: 100 10*3/uL — AB (ref 150–440)
RBC: 4.97 MIL/uL (ref 3.80–5.20)
RDW: 17.7 % — AB (ref 11.5–14.5)
WBC: 6.1 10*3/uL (ref 3.6–11.0)

## 2015-06-09 LAB — COMPREHENSIVE METABOLIC PANEL
ALBUMIN: 4.2 g/dL (ref 3.5–5.0)
ALT: 17 U/L (ref 14–54)
ANION GAP: 5 (ref 5–15)
AST: 22 U/L (ref 15–41)
Alkaline Phosphatase: 79 U/L (ref 38–126)
BILIRUBIN TOTAL: 1.1 mg/dL (ref 0.3–1.2)
BUN: 13 mg/dL (ref 6–20)
CHLORIDE: 102 mmol/L (ref 101–111)
CO2: 29 mmol/L (ref 22–32)
Calcium: 8.9 mg/dL (ref 8.9–10.3)
Creatinine, Ser: 0.72 mg/dL (ref 0.44–1.00)
GFR calc Af Amer: >60 mL/min (ref >60)
Glucose, Bld: 129 mg/dL — ABNORMAL HIGH (ref 65–99)
POTASSIUM: 3.9 mmol/L (ref 3.5–5.1)
Sodium: 136 mmol/L (ref 135–145)
TOTAL PROTEIN: 7 g/dL (ref 6.5–8.1)

## 2015-06-09 LAB — URINALYSIS COMPLETE WITH MICROSCOPIC (ARMC ONLY)
Bilirubin Urine: NEGATIVE
GLUCOSE, UA: NEGATIVE mg/dL
Hgb urine dipstick: NEGATIVE
KETONES UR: NEGATIVE mg/dL
Leukocytes, UA: NEGATIVE
NITRITE: NEGATIVE
PROTEIN: NEGATIVE mg/dL
SPECIFIC GRAVITY, URINE: 1.028 (ref 1.005–1.030)
pH: 5 (ref 5.0–8.0)

## 2015-06-09 MED ORDER — SODIUM CHLORIDE 0.9 % IJ SOLN
10.0000 mL | Freq: Once | INTRAMUSCULAR | Status: AC
  Administered 2015-06-09: 10 mL via INTRAVENOUS
  Filled 2015-06-09: qty 10

## 2015-06-09 MED ORDER — HEPARIN SOD (PORK) LOCK FLUSH 100 UNIT/ML IV SOLN
500.0000 [IU] | Freq: Once | INTRAVENOUS | Status: AC
  Administered 2015-06-09: 500 [IU] via INTRAVENOUS
  Filled 2015-06-09: qty 5

## 2015-06-09 MED ORDER — AMOXICILLIN-POT CLAVULANATE 875-125 MG PO TABS
1.0000 | ORAL_TABLET | Freq: Two times a day (BID) | ORAL | Status: DC
Start: 2015-06-09 — End: 2015-06-24

## 2015-06-09 NOTE — Progress Notes (Signed)
Patient does not have living will.  Never smoked.  Patient complains of N/V prior to appointment today.  States she feels cold all the time.  Has felt achy for past few days.

## 2015-06-10 LAB — CANCER ANTIGEN 27.29: CA 27.29: 28.6 U/mL (ref 0.0–38.6)

## 2015-06-11 ENCOUNTER — Other Ambulatory Visit: Payer: Self-pay | Admitting: Oncology

## 2015-06-11 LAB — URINE CULTURE

## 2015-06-13 ENCOUNTER — Encounter: Payer: Self-pay | Admitting: Oncology

## 2015-06-13 NOTE — Progress Notes (Signed)
[place] @ [facility] [phone]  [phone] [place] OB: 01-15-1961  MR#: [id number]  BDZ#:[id number]  Patient Care Team: Birdie Sons, MD as PCP - General (Family Medicine) Robert Bellow, MD (General Surgery)  CHIEF COMPLAINT:  Chief Complaint  Patient presents with  . Follow-up    Oncology History   Chief Complaint/Problem List  1.  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 Status post Cytoxan, Adriamycin, followed by high dose chemotherapy and stem cell support.  Status post radiation therapy. 1996 2.  Was on Tamoxifen therapy.  3.  Progressive disease with the left supraclavicular lymph node positive on needle aspiration.  4.  Tamoxifen has been discontinued.  5.  Ovarian ablation with Lupron, Femara and radiation to the ovaries starting in December 2003. 6.  On Femara  7.  Rising tumor markers.  CT scan is positive for liver metastases.  And lung metastasesBiopsies positive for metastatic disease (liver biopsy) consistent with breast primary. November, 2014 [place] and progesterone receptor positive.  HER-2 receptor negative.. 8.  Abraxene starting  from September 03, 2013.  9.Patient was taken off Abraxane because of progressive side effect and started on ERIBULIN April 08, 2014     Breast cancer metastasized to liver   08/28/2013 Initial Diagnosis Breast cancer metastasized to liver    Oncology Flowsheet 03/13/2015 03/30/2015 04/14/2015 04/16/2015 04/28/2015 05/12/2015 05/26/2015  Day, Cycle - Day 1, Cycle 2 Day 1, Cycle 3 - Day 8, Cycle 3 Day 1, Cycle 4 Day 8, Cycle 4  dexamethasone (DECADRON) IV - [ 10 mg ] [ 10 mg ] - [ 10 mg ] [ 10 mg ] [ 10 mg ]  eriBULin mesylate (HALAVEN) IV - 1.1 mg/m2 1.1 mg/m2 - 1.1 mg/m2 2.35 mg 1.1 mg/m2  ondansetron (ZOFRAN) IV - [ 8 mg ] [ 8 mg ] - [ 8 mg ] [ 8 mg ] [ 8 mg ]  pegfilgrastim (NEULASTA ONPRO KIT) [place] - 6 mg - - - - -  pegfilgrastim (NEULASTA) [place] 6 mg - - 6 mg - - -    INTERVAL  HISTORY: 54 year old lady who has a 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  No abdominal pain.  No nausea.  No vomiting.  No diarrhea.  No bony pains. Mar 04, 2015 Patient is here for ongoing evaluation complains of epigastric discomfort.  Patient is slightly high tumor markers.  Epigastric discomfort is improved with Zofran.  No nausea.  No vomiting.  Patient has stage IV carcinoma of breast March 30, 2015 [facility] here for ongoing evaluation.  Guarding stage IV carcinoma of breast.  Patient is ON CHEMO   WITH ERIBULIN .  Tolerating treatment very well.  Intermittent nausea.  Abdominal pain is improved.  Tumor markers shows progressive decline.  Appearing.  No numbness.. May 12, 2015 Patient is here for ongoing evaluation and continuation of treatment.  Having increasing and persistent nausea for last few days.  Vomited twice.  No abdominal pain.  No discomfort.  No diarrhea. Appetite has been stable. August, 2016 .  16 th   Patient is here for ongoing evaluation and treatment consideration regarding stage IV carcinoma of breast.  Epigastric discomfort and nausea and vomiting is improved.  Patient's CT scan continues to show improvement tumor marker continues  to shows improvement.  No nausea no vomiting patient continuing on a ERIBULIN  therapy  Here for continuation of treatment   June 09, 2015 Patient came today further follow-up.  Feels somewhat weak and tired.  He keep.  Had low-grade fever.  Chills.  Sore throat. Nausea and vomiting early this morning   REVIEW OF SYSTEMS:   GENERAL:  Patient is feeling weak and tired. Low-grade fever.  Aches and pains PERFORMANCE STATUS (ECOG):   HEENT:  No visual changes, runny nose, sore throat, mouth sores or tenderness. Lungs: No shortness of breath or cough.  No  hemoptysis. Cardiac:  No chest pain, palpitations, orthopnea, or PND. GI: intermittent  Nausea  HAS IMPROVED   No abdominal pain. He should not complaints of nausea and vomiting for last 2 days.  Epigastric discomfort.  Taking Nexium once a day GU:  No urgency, frequency, dysuria, or hematuria.June 09, 2015 Musculoskeletal:  No back pain.  No joint pain.  No muscle tenderness. Extremities:  No pain or swelling. Skin:  No rashes or skin changes. Neuro:  No headache, numbness or weakness, balance or coordination issues. Endocrine:  No diabetes, thyroid issues, hot flashes or night sweats. Psych:  No mood changes, depression or anxiety. Pain:  No focal pain. Review of systems:  All other systems reviewed and found to be negative. Epigastric discomfort which is persistent dull aching As per HPI. Otherwise, a complete review of systems is negatve.  PAST MEDICAL HISTORY: Past Medical History  Diagnosis Date  . Arthritis   . Diabetes mellitus without complication   . Colon polyps   . GERD (gastroesophageal reflux disease)   . Cancer 2002     LEFT MASTECTOMY  . Breast cancer, female 20    PAST SURGICAL HISTORY: Past Surgical History  Procedure Laterality Date  . Mastectomy Left 2002  . Colonoscopy  [id number]    DR.BYRNETT  . Foot surgery Right   . Gastric endoscopy   2002  . Bone marrow transplant    . Cholecystectomy  1995  . Sigmoidoscopy  2013    FAMILY HISTORY Family History  Problem Relation Age of Onset  . Colon cancer Sister   . COPD Mother       ADVANCED DIRECTIVES:  Patient does   Not have advanced health care directive Patient does not have any advanced healthcare directive. Information has been given.     HEALTH MAINTENANCE: Social History  Substance Use Topics  . Smoking status: Never Smoker   . Smokeless tobacco: Never Used  . Alcohol Use: No      Allergies  Allergen Reactions  . Codeine Hives and Nausea Only    Other Reaction: GI Upset  .  Latex Itching and Hives  . Shellfish Allergy Swelling    Lips    Current Outpatient Prescriptions  Medication Sig Dispense Refill  . ALPRAZolam (XANAX) 0.25 MG tablet Take 1 tablet (0.25 mg total) by mouth every 8 (eight) hours as needed for anxiety. 30 tablet 3  . Ascorbic Acid (VITAMIN C) 1000 MG tablet Take 1,000 mg by mouth daily.    . Calcium Carb-Cholecalciferol 600-400 MG-UNIT TABS Take by mouth.    . calcium carbonate (OS-CAL - DOSED IN MG OF ELEMENTAL CALCIUM) 1250 MG tablet Take 1 tablet by mouth daily.    Marland Kitchen docusate sodium (COLACE) 100 MG capsule Take by mouth.    . esomeprazole (NEXIUM) 40 MG capsule Take 40 mg by mouth 2 (two) times daily before a meal.     .  esomeprazole (NEXIUM) 40 MG packet Take 40 mg by mouth daily before breakfast.    . fentaNYL (DURAGESIC - DOSED MCG/HR) 25 MCG/HR patch Place 1 patch (25 mcg total) onto the skin every 3 (three) days. 10 patch 0  . ferrous sulfate 325 (65 FE) MG tablet Take by mouth.    . fexofenadine (ALLEGRA) 180 MG tablet Take 180 mg by mouth daily.    . furosemide (LASIX) 20 MG tablet TAKE 1 TABLET EVERY DAY 30 tablet 4  . glipiZIDE (GLUCOTROL) 5 MG tablet   0  . letrozole (FEMARA) 2.5 MG tablet Take 2.5 mg by mouth daily.    Marland Kitchen lidocaine-prilocaine (EMLA) cream   3  . meloxicam (MOBIC) 15 MG tablet Take 15 mg by mouth daily.    . metFORMIN (GLUCOPHAGE) 500 MG tablet Take 500 mg by mouth 2 (two) times daily with a meal.    . ondansetron (ZOFRAN) 4 MG tablet TAKE 1 TABLET (4 MG TOTAL) BY MOUTH EVERY 6 (SIX) HOURS AS NEEDED FOR NAUSEA OR VOMITING. 30 tablet 3  . oxyCODONE-acetaminophen (PERCOCET/ROXICET) 5-325 MG per tablet Take 1 tablet by mouth every 4 (four) hours as needed for severe pain. 60 tablet 0  . PARoxetine (PAXIL) 40 MG tablet TAKE 1 TABLET ONCE DAILY 30 tablet 3  . polyethylene glycol powder (GLYCOLAX/MIRALAX) powder 255 grams one bottle for colonoscopy prep 255 g 0  . spironolactone (ALDACTONE) 100 MG tablet Take 1 tablet  (100 mg total) by mouth daily. 30 tablet 6  . traMADol (ULTRAM) 50 MG tablet Take 50 mg by mouth every 6 (six) hours as needed for pain.    . vitamin C (ASCORBIC ACID) 500 MG tablet Take by mouth.    . vitamin E 400 UNIT capsule Take 400 Units by mouth daily.    Marland Kitchen amoxicillin-clavulanate (AUGMENTIN) 875-125 MG per tablet Take 1 tablet by mouth 2 (two) times daily. 14 tablet 0  . KLOR-CON 10 10 MEQ tablet TAKE 1 TABLET (10 MEQ TOTAL) BY MOUTH 2 (TWO) TIMES DAILY. 60 tablet 3   No current facility-administered medications for this visit.   Facility-Administered Medications Ordered in Other Visits  Medication Dose Route Frequency Provider Last Rate Last Dose  . sodium chloride 0.9 % injection 10 mL  10 mL Intracatheter PRN Forest Gleason, MD   10 mL at 02/18/15 1453    OBJECTIVE:  Filed Vitals:   06/09/15 1416  BP: 113/84  Pulse: 110  Temp: 97.6 F (36.4 C)     Body mass index is 35.49 kg/(m^2).    ECOG FS:1 - Symptomatic but completely ambulatory  PHYSICAL EXAM: GENERAL:  Well developed, well nourished, sitting comfortably in the exam room in no acute distress. MENTAL STATUS:  Alert and oriented to person, place and time. HEAD:alopecia i.  Normocephalic, atraumatic, face symmetric, no Cushingoid features. EYES:  Pupils equal round and reactive to light and accomodation.  No conjunctivitis or scleral icterus. ENT:  Oropharynx clear without lesion.  Tongue normal. Mucous membranes moist.  RESPIRATORY:  Clear to auscultation without rales, wheezes or rhonchi. CARDIOVASCULAR:  Regular rate and rhythm without murmur, rub or gallop. Abdomen: Tenderness in epigastric area.  Liver is not palpable.  No ascites. Oh palpable mass BACK:  No CVA tenderness.  No tenderness on percussion of the back or rib cage. SKIN:  No rashes, ulcers or lesions. EXTREMITIES: No edema, no skin discoloration or tenderness.  No palpable cords. LYMPH NODES: No palpable cervical, supraclavicular, axillary or inguinal  adenopathy  NEUROLOGICAL: Unremarkable. PSYCH:  Appropriate.   LAB RESULTS:  Orders Only on 06/09/2015  Component Date Value Ref Range Status  . Specimen Description 06/09/2015 URINE, CLEAN CATCH   Final  . Special Requests 06/09/2015 NONE   Final  . Culture 06/09/2015 MULTIPLE SPECIES PRESENT, SUGGEST RECOLLECTION   Final  . Report Status 06/09/2015 06/11/2015 FINAL   Final  . Color, Urine 06/09/2015 AMBER* YELLOW Final  . APPearance 06/09/2015 CLEAR* CLEAR Final  . Glucose, UA 06/09/2015 NEGATIVE  NEGATIVE mg/dL Final  . Bilirubin Urine 06/09/2015 NEGATIVE  NEGATIVE Final  . Ketones, ur 06/09/2015 NEGATIVE  NEGATIVE mg/dL Final  . Specific Gravity, Urine 06/09/2015 1.028  1.005 - 1.030 Final  . Hgb urine dipstick 06/09/2015 NEGATIVE  NEGATIVE Final  . pH 06/09/2015 5.0  5.0 - 8.0 Final  . Protein, ur 06/09/2015 NEGATIVE  NEGATIVE mg/dL Final  . Nitrite 06/09/2015 NEGATIVE  NEGATIVE Final  . Leukocytes, UA 06/09/2015 NEGATIVE  NEGATIVE Final  . RBC / HPF 06/09/2015 0-5  0 - 5 RBC/hpf Final  . WBC, UA 06/09/2015 0-5  0 - 5 WBC/hpf Final  . Bacteria, UA 06/09/2015 RARE* NONE SEEN Final  . Squamous Epithelial / LPF 06/09/2015 0-5* NONE SEEN Final  . Mucous 06/09/2015 PRESENT   Final  Infusion on 06/09/2015  Component Date Value Ref Range Status  . WBC 06/09/2015 6.1  3.6 - 11.0 K/uL Final   A-LINE DRAW  . RBC 06/09/2015 4.97  3.80 - 5.20 MIL/uL Final  . Hemoglobin 06/09/2015 14.1  12.0 - 16.0 g/dL Final  . HCT 06/09/2015 41.9  35.0 - 47.0 % Final  . MCV 06/09/2015 84.4  80.0 - 100.0 fL Final  . MCH 06/09/2015 28.4  26.0 - 34.0 pg Final  . MCHC 06/09/2015 33.7  32.0 - 36.0 g/dL Final  . RDW 06/09/2015 17.7* 11.5 - 14.5 % Final  . Platelets 06/09/2015 100* 150 - 440 K/uL Final  . Neutrophils Relative % 06/09/2015 58   Final  . Neutro Abs 06/09/2015 3.6  1.4 - 6.5 K/uL Final  . Lymphocytes Relative 06/09/2015 29   Final  . Lymphs Abs 06/09/2015 1.7  1.0 - 3.6 K/uL Final  .  Monocytes Relative 06/09/2015 10   Final  . Monocytes Absolute 06/09/2015 0.6  0.2 - 0.9 K/uL Final  . Eosinophils Relative 06/09/2015 2   Final  . Eosinophils Absolute 06/09/2015 0.1  0 - 0.7 K/uL Final  . Basophils Relative 06/09/2015 1   Final  . Basophils Absolute 06/09/2015 0.1  0 - 0.1 K/uL Final  . Sodium 06/09/2015 136  135 - 145 mmol/L Final  . Potassium 06/09/2015 3.9  3.5 - 5.1 mmol/L Final  . Chloride 06/09/2015 102  101 - 111 mmol/L Final  . CO2 06/09/2015 29  22 - 32 mmol/L Final  . Glucose, Bld 06/09/2015 129* 65 - 99 mg/dL Final  . BUN 06/09/2015 13  6 - 20 mg/dL Final  . Creatinine, Ser 06/09/2015 0.72  0.44 - 1.00 mg/dL Final  . Calcium 06/09/2015 8.9  8.9 - 10.3 mg/dL Final  . Total Protein 06/09/2015 7.0  6.5 - 8.1 g/dL Final  . Albumin 06/09/2015 4.2  3.5 - 5.0 g/dL Final  . AST 06/09/2015 22  15 - 41 U/L Final  . ALT 06/09/2015 17  14 - 54 U/L Final  . Alkaline Phosphatase 06/09/2015 79  38 - 126 U/L Final  . Total Bilirubin 06/09/2015 1.1  0.3 - 1.2 mg/dL Final  . GFR calc  non Af Amer 06/09/2015 >60  >60 mL/min Final  . GFR calc Af Amer 06/09/2015 >60  >60 mL/min Final   Comment: (NOTE) The eGFR has been calculated using the CKD EPI equation. This calculation has not been validated in all clinical situations. eGFR's persistently <60 mL/min signify possible Chronic Kidney Disease.   . Anion gap 06/09/2015 5  5 - 15 Final  . CA 27.29 06/09/2015 28.6  0.0 - 38.6 U/mL Final   Comment: (NOTE) [employer]/ACS methodology Performed At: [facility] [place], [place] [id number] Lindon Romp MD FI:[id number]     Lab Results  Component Value Date   LABCA2 28.6 06/09/2015   IMPRESSION: 1. Similar appearance of the liver. 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 The posterior right hepatic lobe dominant lesion is unchanged in size and may represent a  hemangioma, given peripheral nodular enhancement. Portal venous hypertension, including periesophageal ascites. 2. No extrahepatic metastatic disease identified. 3. Possible constipation. No explanation for vomiting.   ASSESSMENT: Stage IV carcinoma breast previous multiple chemotherapy programs patient is responding to it ERIBULIN.  PET scan shows stable disease tumor markers are slightly elevated. 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  Muscle aches suggest viral infection possibility of underlying bacterial infection cannot be ruled out.  Urine analysis has been done.  Patient has been started on Augmentin twice a day for 7 days ,hold chemotherapy   Patient had a previous cholecystectomy Tumor markers are still stable Continue chemotherapy The scan of abdomen has been reviewed.  Independently.  No evidence of new lesions.  Recent metastases has been stable.  Tumor markers are getting better.  I reviewed CT scan with patient   Breast cancer metastasized to liver   Staging form: Breast, AJCC 7th Edition     Clinical: Stage IV (T2, N3, M1) - Signed by Forest Gleason, MD on 02/18/2015   Forest Gleason, MD   06/13/2015 2:19 PM

## 2015-06-23 ENCOUNTER — Inpatient Hospital Stay (HOSPITAL_BASED_OUTPATIENT_CLINIC_OR_DEPARTMENT_OTHER): Payer: Managed Care, Other (non HMO) | Admitting: Oncology

## 2015-06-23 ENCOUNTER — Ambulatory Visit
Admission: RE | Admit: 2015-06-23 | Discharge: 2015-06-23 | Disposition: A | Discharge status: Home / Self Care | Payer: Managed Care, Other (non HMO) | Source: Ambulatory Visit | Attending: Oncology | Admitting: Oncology

## 2015-06-23 ENCOUNTER — Inpatient Hospital Stay: Payer: Managed Care, Other (non HMO) | Attending: Oncology

## 2015-06-23 ENCOUNTER — Inpatient Hospital Stay: Payer: Managed Care, Other (non HMO)

## 2015-06-23 ENCOUNTER — Encounter: Payer: Self-pay | Admitting: Oncology

## 2015-06-23 VITALS — BP 155/80 | HR 105 | Temp 98.1°F | Resp 22 | Wt 205.0 lb

## 2015-06-23 DIAGNOSIS — Z923 Personal history of irradiation: Secondary | ICD-10-CM | POA: Insufficient documentation

## 2015-06-23 DIAGNOSIS — M129 Arthropathy, unspecified: Secondary | ICD-10-CM | POA: Diagnosis not present

## 2015-06-23 DIAGNOSIS — Z9221 Personal history of antineoplastic chemotherapy: Secondary | ICD-10-CM

## 2015-06-23 DIAGNOSIS — D731 Hypersplenism: Secondary | ICD-10-CM

## 2015-06-23 DIAGNOSIS — R197 Diarrhea, unspecified: Secondary | ICD-10-CM | POA: Diagnosis not present

## 2015-06-23 DIAGNOSIS — Z79811 Long term (current) use of aromatase inhibitors: Secondary | ICD-10-CM | POA: Diagnosis not present

## 2015-06-23 DIAGNOSIS — R112 Nausea with vomiting, unspecified: Secondary | ICD-10-CM

## 2015-06-23 DIAGNOSIS — D696 Thrombocytopenia, unspecified: Secondary | ICD-10-CM | POA: Insufficient documentation

## 2015-06-23 DIAGNOSIS — R5383 Other fatigue: Secondary | ICD-10-CM | POA: Insufficient documentation

## 2015-06-23 DIAGNOSIS — E119 Type 2 diabetes mellitus without complications: Secondary | ICD-10-CM

## 2015-06-23 DIAGNOSIS — C50912 Malignant neoplasm of unspecified site of left female breast: Secondary | ICD-10-CM | POA: Diagnosis present

## 2015-06-23 DIAGNOSIS — C50919 Malignant neoplasm of unspecified site of unspecified female breast: Secondary | ICD-10-CM

## 2015-06-23 DIAGNOSIS — Z170 Estrogen receptor positive status [ER+]: Secondary | ICD-10-CM | POA: Insufficient documentation

## 2015-06-23 DIAGNOSIS — R509 Fever, unspecified: Secondary | ICD-10-CM | POA: Diagnosis not present

## 2015-06-23 DIAGNOSIS — K219 Gastro-esophageal reflux disease without esophagitis: Secondary | ICD-10-CM

## 2015-06-23 DIAGNOSIS — R634 Abnormal weight loss: Secondary | ICD-10-CM | POA: Insufficient documentation

## 2015-06-23 DIAGNOSIS — Z86010 Personal history of colonic polyps: Secondary | ICD-10-CM

## 2015-06-23 DIAGNOSIS — C787 Secondary malignant neoplasm of liver and intrahepatic bile duct: Secondary | ICD-10-CM | POA: Diagnosis not present

## 2015-06-23 DIAGNOSIS — Z800 Family history of malignant neoplasm of digestive organs: Secondary | ICD-10-CM | POA: Insufficient documentation

## 2015-06-23 DIAGNOSIS — R531 Weakness: Secondary | ICD-10-CM

## 2015-06-23 DIAGNOSIS — C7951 Secondary malignant neoplasm of bone: Secondary | ICD-10-CM

## 2015-06-23 DIAGNOSIS — D6959 Other secondary thrombocytopenia: Secondary | ICD-10-CM | POA: Diagnosis not present

## 2015-06-23 DIAGNOSIS — R05 Cough: Secondary | ICD-10-CM | POA: Insufficient documentation

## 2015-06-23 DIAGNOSIS — Z79899 Other long term (current) drug therapy: Secondary | ICD-10-CM

## 2015-06-23 DIAGNOSIS — Z9012 Acquired absence of left breast and nipple: Secondary | ICD-10-CM | POA: Insufficient documentation

## 2015-06-23 LAB — CBC WITH DIFFERENTIAL/PLATELET
BASOS ABS: 0.1 10*3/uL (ref 0–0.1)
Basophils Relative: 1 %
Eosinophils Absolute: 0.2 10*3/uL (ref 0–0.7)
Eosinophils Relative: 2 %
HEMATOCRIT: 42.8 % (ref 35.0–47.0)
HEMOGLOBIN: 14.5 g/dL (ref 12.0–16.0)
LYMPHS PCT: 16 %
Lymphs Abs: 1.4 10*3/uL (ref 1.0–3.6)
MCH: 28.4 pg (ref 26.0–34.0)
MCHC: 33.9 g/dL (ref 32.0–36.0)
MCV: 83.8 fL (ref 80.0–100.0)
MONO ABS: 0.4 10*3/uL (ref 0.2–0.9)
Monocytes Relative: 5 %
NEUTROS ABS: 6.5 10*3/uL (ref 1.4–6.5)
Neutrophils Relative %: 76 %
Platelets: 75 10*3/uL — ABNORMAL LOW (ref 150–440)
RBC: 5.11 MIL/uL (ref 3.80–5.20)
RDW: 17.1 % — AB (ref 11.5–14.5)
WBC: 8.6 10*3/uL (ref 3.6–11.0)

## 2015-06-23 LAB — COMPREHENSIVE METABOLIC PANEL
ALK PHOS: 87 U/L (ref 38–126)
ALT: 30 U/L (ref 14–54)
AST: 29 U/L (ref 15–41)
Albumin: 4.3 g/dL (ref 3.5–5.0)
Anion gap: 9 (ref 5–15)
BILIRUBIN TOTAL: 1.3 mg/dL — AB (ref 0.3–1.2)
BUN: 11 mg/dL (ref 6–20)
CALCIUM: 8.8 mg/dL — AB (ref 8.9–10.3)
CO2: 26 mmol/L (ref 22–32)
CREATININE: 0.79 mg/dL (ref 0.44–1.00)
Chloride: 98 mmol/L — ABNORMAL LOW (ref 101–111)
GFR calc Af Amer: >60 mL/min (ref >60)
Glucose, Bld: 148 mg/dL — ABNORMAL HIGH (ref 65–99)
Potassium: 3.4 mmol/L — ABNORMAL LOW (ref 3.5–5.1)
Sodium: 133 mmol/L — ABNORMAL LOW (ref 135–145)
TOTAL PROTEIN: 6.9 g/dL (ref 6.5–8.1)

## 2015-06-23 MED ORDER — SODIUM CHLORIDE 0.9 % IV SOLN
Freq: Once | INTRAVENOUS | Status: AC
  Administered 2015-06-23: 16:00:00 via INTRAVENOUS
  Filled 2015-06-23: qty 4

## 2015-06-23 MED ORDER — SODIUM CHLORIDE 0.9 % IV SOLN
Freq: Once | INTRAVENOUS | Status: AC
  Administered 2015-06-23: 16:00:00 via INTRAVENOUS
  Filled 2015-06-23: qty 1000

## 2015-06-23 MED ORDER — HEPARIN SOD (PORK) LOCK FLUSH 100 UNIT/ML IV SOLN
500.0000 [IU] | Freq: Once | INTRAVENOUS | Status: AC
Start: 2015-06-23 — End: 2015-06-23
  Administered 2015-06-23: 500 [IU] via INTRAVENOUS
  Filled 2015-06-23: qty 5

## 2015-06-23 MED ORDER — SODIUM CHLORIDE 0.9 % IJ SOLN
10.0000 mL | INTRAMUSCULAR | Status: DC | PRN
  Administered 2015-06-23: 10 mL via INTRAVENOUS
  Filled 2015-06-23: qty 10

## 2015-06-23 MED ORDER — LORAZEPAM 2 MG/ML IJ SOLN
0.5000 mg | Freq: Once | INTRAMUSCULAR | Status: AC
  Administered 2015-06-23: 0.5 mg via INTRAVENOUS
  Filled 2015-06-23: qty 1

## 2015-06-23 MED ORDER — FENTANYL 25 MCG/HR TD PT72: 25.0000 ug | MEDICATED_PATCH | TRANSDERMAL | Status: DC

## 2015-06-23 NOTE — Addendum Note (Signed)
Addended by: Livia Snellen on: 06/23/2015 04:28 PM   Modules accepted: Orders, Medications

## 2015-06-23 NOTE — Progress Notes (Signed)
Paula Price @ [facility] [phone]  Fax:([phone] [place] OB: 06/14/1961  MR#: [id number]  EML#:[id number]  Patient Care Team: Birdie Sons, MD as PCP - General (Family Medicine) Robert Bellow, MD (General Surgery)  CHIEF COMPLAINT:  No chief complaint on file.   Chief Complaint/Problem List  1.  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 Status post Cytoxan, Adriamycin, followed by high dose chemotherapy and stem cell support.  Status post radiation therapy. 1996 2.  Was on Tamoxifen therapy.  3.  Progressive disease with the left supraclavicular lymph node positive on needle aspiration.  4.  Tamoxifen has been discontinued.  5.  Ovarian ablation with Lupron, Femara and radiation to the ovaries starting in December 2003. 6.  On Femara  7.  Rising tumor markers.  CT scan is positive for liver metastases.  And lung metastasesBiopsies positive for metastatic disease (liver biopsy) consistent with breast primary. November, 2014 [place] and progesterone receptor positive.  HER-2 receptor negative.. 8.  Abraxene starting  from September 03, 2013.  9.Patient was taken off Abraxane because of progressive side effect and started on ERIBULIN April 08, 2014        Oncology Flowsheet 03/13/2015 03/30/2015 04/14/2015 04/16/2015 04/28/2015 05/12/2015 05/26/2015  Day, Cycle - Day 1, Cycle 2 Day 1, Cycle 3 - Day 8, Cycle 3 Day 1, Cycle 4 Day 8, Cycle 4  dexamethasone (DECADRON) IV - [ 10 mg ] [ 10 mg ] - [ 10 mg ] [ 10 mg ] [ 10 mg ]  eriBULin mesylate (HALAVEN) IV - 1.1 mg/m2 1.1 mg/m2 - 1.1 mg/m2 2.35 mg 1.1 mg/m2  ondansetron (ZOFRAN) IV - [ 8 mg ] [ 8 mg ] - [ 8 mg ] [ 8 mg ] [ 8 mg ]  pegfilgrastim (NEULASTA ONPRO KIT) [place] - 6 mg - - - - -  pegfilgrastim (NEULASTA) [place] 6 mg - - 6 mg - - -    INTERVAL HISTORY: 54 year old lady who has a 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  No abdominal pain.  No nausea.  No vomiting.  No diarrhea.  No bony pains. Mar 04, 2015 Patient is here for ongoing evaluation complains of epigastric discomfort.  Patient is slightly high tumor markers.  Epigastric discomfort is improved with Zofran.  No nausea.  No vomiting.  Patient has stage IV carcinoma of breast March 30, 2015 [facility] here for ongoing evaluation.  Guarding stage IV carcinoma of breast.  Patient is ON CHEMO   WITH ERIBULIN .  Tolerating treatment very well.  Intermittent nausea.  Abdominal pain is improved.  Tumor markers shows progressive decline.  Appearing.  No numbness.. May 12, 2015 Patient is here for ongoing evaluation and continuation of treatment.  Having increasing and persistent nausea for last few days.  Vomited twice.  No abdominal pain.  No discomfort.  No diarrhea. Appetite has been stable. August, 2016 .  16 th   Patient is here for ongoing evaluation and treatment consideration regarding stage IV carcinoma of breast.  Epigastric discomfort and nausea and vomiting is improved.  Patient's CT scan continues to show improvement tumor marker continues to shows improvement.  No nausea no vomiting patient continuing on a ERIBULIN  therapy  Here for continuation of treatment   June 09, 2015 Patient came today further follow-up.  Feels somewhat weak and tired.  He keep.  Had low-grade fever.  Chills.  Sore throat. Nausea and vomiting early this morning June 23, 2015 Patient's condition has been declining for last few days was started on amoxicillin resulting in 2 nausea vomiting diarrhea.  Diarrhea has improved patient continues to have chills persistent nausea vomiting. Here for further follow-up feeling weak and tired. Centers loss 6 pounds of weight since last evaluation.  REVIEW OF SYSTEMS:   GENERAL:  Patient is feeling weak and  tired. Low-grade fever.  Aches and pains PERFORMANCE STATUS (ECOG):   HEENT:  No visual changes, runny nose, sore throat, mouth sores or tenderness. Lungs: No shortness of breath or cough.  No hemoptysis. Cardiac:  No chest pain, palpitations, orthopnea, or PND. GI: intermittent  Nausea  HAS IMPROVED   No abdominal pain. He should not complaints of nausea and vomiting for last 2 days.  Epigastric discomfort.  Taking Nexium once a day GU:  No urgency, frequency, dysuria, or hematuria.June 09, 2015 Musculoskeletal:  No back pain.  No joint pain.  No muscle tenderness. Extremities:  No pain or swelling. Skin:  No rashes or skin changes. Neuro:  No headache, numbness or weakness, balance or coordination issues. Endocrine:  No diabetes, thyroid issues, hot flashes or night sweats. Psych:  No mood changes, depression or anxiety. Pain:  No focal pain. Review of systems:  All other systems reviewed and found to be negative. Epigastric discomfort which is persistent dull aching As per HPI. Otherwise, a complete review of systems is negatve.  PAST MEDICAL HISTORY: Past Medical History  Diagnosis Date  . Arthritis   . Diabetes mellitus without complication   . Colon polyps   . GERD (gastroesophageal reflux disease)   . Cancer 2002     LEFT MASTECTOMY  . Breast cancer, female 43    PAST SURGICAL HISTORY: Past Surgical History  Procedure Laterality Date  . Mastectomy Left 2002  . Colonoscopy  1962,2297    [email or web address]  . Foot surgery Right   . Gastric endoscopy   2002  . Bone marrow transplant    . Cholecystectomy  1995  . Sigmoidoscopy  2013    FAMILY HISTORY Family History  Problem Relation Age of Onset  . Colon cancer Sister   . COPD Mother       ADVANCED DIRECTIVES:  Patient does   Not have advanced health care directive Patient does not have any advanced healthcare directive. Information has been given.     HEALTH MAINTENANCE: Social History  Substance Use  Topics  . Smoking status: Never Smoker   . Smokeless tobacco: Never Used  . Alcohol Use: No      Allergies  Allergen Reactions  . Codeine Hives and Nausea Only    Other Reaction: GI Upset  . Latex Itching and Hives  . Shellfish Allergy Swelling    Lips    Current Outpatient Prescriptions  Medication Sig Dispense Refill  . ALPRAZolam (XANAX) 0.25 MG tablet Take 1 tablet (0.25 mg total) by mouth every 8 (eight) hours as needed for anxiety. 30 tablet 3  . amoxicillin-clavulanate (AUGMENTIN) 875-125 MG per tablet Take 1 tablet by mouth 2 (two) times daily. 14 tablet 0  . Ascorbic Acid (VITAMIN C) 1000 MG tablet Take 1,000 mg by mouth daily.    . Calcium Carb-Cholecalciferol 600-400 MG-UNIT TABS Take by mouth.    . calcium carbonate (OS-CAL - DOSED IN MG OF  ELEMENTAL CALCIUM) 1250 MG tablet Take 1 tablet by mouth daily.    Marland Kitchen docusate sodium (COLACE) 100 MG capsule Take by mouth.    . esomeprazole (NEXIUM) 40 MG capsule Take 40 mg by mouth 2 (two) times daily before a meal.     . esomeprazole (NEXIUM) 40 MG packet Take 40 mg by mouth daily before breakfast.    . fentaNYL (DURAGESIC - DOSED MCG/HR) 25 MCG/HR patch Place 1 patch (25 mcg total) onto the skin every 3 (three) days. 10 patch 0  . ferrous sulfate 325 (65 FE) MG tablet Take by mouth.    . fexofenadine (ALLEGRA) 180 MG tablet Take 180 mg by mouth daily.    . furosemide (LASIX) 20 MG tablet TAKE 1 TABLET EVERY DAY 30 tablet 4  . glipiZIDE (GLUCOTROL) 5 MG tablet   0  . KLOR-CON 10 10 MEQ tablet TAKE 1 TABLET (10 MEQ TOTAL) BY MOUTH 2 (TWO) TIMES DAILY. 60 tablet 3  . letrozole (FEMARA) 2.5 MG tablet Take 2.5 mg by mouth daily.    Marland Kitchen lidocaine-prilocaine (EMLA) cream   3  . meloxicam (MOBIC) 15 MG tablet Take 15 mg by mouth daily.    . metFORMIN (GLUCOPHAGE) 500 MG tablet Take 500 mg by mouth 2 (two) times daily with a meal.    . ondansetron (ZOFRAN) 4 MG tablet TAKE 1 TABLET (4 MG TOTAL) BY MOUTH EVERY 6 (SIX) HOURS AS NEEDED FOR  NAUSEA OR VOMITING. 30 tablet 3  . oxyCODONE-acetaminophen (PERCOCET/ROXICET) 5-325 MG per tablet Take 1 tablet by mouth every 4 (four) hours as needed for severe pain. 60 tablet 0  . PARoxetine (PAXIL) 40 MG tablet TAKE 1 TABLET ONCE DAILY 30 tablet 3  . polyethylene glycol powder (GLYCOLAX/MIRALAX) powder 255 grams one bottle for colonoscopy prep 255 g 0  . spironolactone (ALDACTONE) 100 MG tablet Take 1 tablet (100 mg total) by mouth daily. 30 tablet 6  . traMADol (ULTRAM) 50 MG tablet Take 50 mg by mouth every 6 (six) hours as needed for pain.    . vitamin C (ASCORBIC ACID) 500 MG tablet Take by mouth.    . vitamin E 400 UNIT capsule Take 400 Units by mouth daily.     No current facility-administered medications for this visit.   Facility-Administered Medications Ordered in Other Visits  Medication Dose Route Frequency Provider Last Rate Last Dose  . sodium chloride 0.9 % injection 10 mL  10 mL Intracatheter PRN Forest Gleason, MD   10 mL at 02/18/15 1453    OBJECTIVE:  There were no vitals filed for this visit.   There is no weight on file to calculate BMI.    ECOG FS:1 - Symptomatic but completely ambulatory  PHYSICAL EXAM: GENERAL:  Well developed, well nourished, sitting comfortably in the exam room in no acute distress. MENTAL STATUS:  Alert and oriented to person, place and time. HEAD:alopecia i.  Normocephalic, atraumatic, face symmetric, no Cushingoid features. EYES:  Pupils equal round and reactive to light and accomodation.  No conjunctivitis or scleral icterus. ENT:  Oropharynx clear without lesion.  Tongue normal. Mucous membranes moist.  RESPIRATORY:  Clear to auscultation without rales, wheezes or rhonchi. CARDIOVASCULAR:  Regular rate and rhythm without murmur, rub or gallop. Abdomen: Tenderness in epigastric area.  Liver is not palpable.  No ascites. Oh palpable mass BACK:  No CVA tenderness.  No tenderness on percussion of the back or rib cage. SKIN:  No rashes,  ulcers or lesions. EXTREMITIES: No  edema, no skin discoloration or tenderness.  No palpable cords. LYMPH NODES: No palpable cervical, supraclavicular, axillary or inguinal adenopathy  NEUROLOGICAL: Unremarkable. PSYCH:  Appropriate.   LAB RESULTS:  Appointment on 06/23/2015  Component Date Value Ref Range Status  . WBC 06/23/2015 8.6  3.6 - 11.0 K/uL Final   A-LINE DRAW  . RBC 06/23/2015 5.11  3.80 - 5.20 MIL/uL Final  . Hemoglobin 06/23/2015 14.5  12.0 - 16.0 g/dL Final  . HCT 06/23/2015 42.8  35.0 - 47.0 % Final  . MCV 06/23/2015 83.8  80.0 - 100.0 fL Final  . MCH 06/23/2015 28.4  26.0 - 34.0 pg Final  . MCHC 06/23/2015 33.9  32.0 - 36.0 g/dL Final  . RDW 06/23/2015 17.1* 11.5 - 14.5 % Final  . Platelets 06/23/2015 75* 150 - 440 K/uL Final  . Neutrophils Relative % 06/23/2015 76   Final  . Neutro Abs 06/23/2015 6.5  1.4 - 6.5 K/uL Final  . Lymphocytes Relative 06/23/2015 16   Final  . Lymphs Abs 06/23/2015 1.4  1.0 - 3.6 K/uL Final  . Monocytes Relative 06/23/2015 5   Final  . Monocytes Absolute 06/23/2015 0.4  0.2 - 0.9 K/uL Final  . Eosinophils Relative 06/23/2015 2   Final  . Eosinophils Absolute 06/23/2015 0.2  0 - 0.7 K/uL Final  . Basophils Relative 06/23/2015 1   Final  . Basophils Absolute 06/23/2015 0.1  0 - 0.1 K/uL Final  . Sodium 06/23/2015 133* 135 - 145 mmol/L Final  . Potassium 06/23/2015 3.4* 3.5 - 5.1 mmol/L Final  . Chloride 06/23/2015 98* 101 - 111 mmol/L Final  . CO2 06/23/2015 26  22 - 32 mmol/L Final  . Glucose, Bld 06/23/2015 148* 65 - 99 mg/dL Final  . BUN 06/23/2015 11  6 - 20 mg/dL Final  . Creatinine, Ser 06/23/2015 0.79  0.44 - 1.00 mg/dL Final  . Calcium 06/23/2015 8.8* 8.9 - 10.3 mg/dL Final  . Total Protein 06/23/2015 6.9  6.5 - 8.1 g/dL Final  . Albumin 06/23/2015 4.3  3.5 - 5.0 g/dL Final  . AST 06/23/2015 29  15 - 41 U/L Final  . ALT 06/23/2015 30  14 - 54 U/L Final  . Alkaline Phosphatase 06/23/2015 87  38 - 126 U/L Final  . Total  Bilirubin 06/23/2015 1.3* 0.3 - 1.2 mg/dL Final  . GFR calc non Af Amer 06/23/2015 >60  >60 mL/min Final  . GFR calc Af Amer 06/23/2015 >60  >60 mL/min Final   Comment: (NOTE) The eGFR has been calculated using the CKD EPI equation. This calculation has not been validated in all clinical situations. eGFR's persistently <60 mL/min signify possible Chronic Kidney Disease.   . Anion gap 06/23/2015 9  5 - 15 Final    Lab Results  Component Value Date   LABCA2 28.6 06/09/2015   IMPRESSION: 1. Similar appearance of the liver. 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 The posterior right hepatic lobe dominant lesion is unchanged in size and may represent a hemangioma, given peripheral nodular enhancement. Portal venous hypertension, including periesophageal ascites. 2. No extrahepatic metastatic disease identified. 3. Possible constipation. No explanation for vomiting.   ASSESSMENT: Stage IV carcinoma breast previous multiple chemotherapy programs patient is responding to it ERIBULIN.    New onset of persistent nausea vomiting diarrhea 8 pound of weight loss chills.  Exact etiology of all the symptoms not clear.  Patient is off antibiotic will culture 2  sets and then get IV fluid today hold further chemotherapy if needed patient will be hospitalized for further GI workup Urine analysis and culture. 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

## 2015-06-24 ENCOUNTER — Inpatient Hospital Stay (HOSPITAL_BASED_OUTPATIENT_CLINIC_OR_DEPARTMENT_OTHER): Payer: Managed Care, Other (non HMO) | Admitting: Oncology

## 2015-06-24 VITALS — BP 116/84 | HR 85 | Temp 97.5°F | Wt 206.6 lb

## 2015-06-24 DIAGNOSIS — C50912 Malignant neoplasm of unspecified site of left female breast: Secondary | ICD-10-CM

## 2015-06-24 DIAGNOSIS — R531 Weakness: Secondary | ICD-10-CM

## 2015-06-24 DIAGNOSIS — C7951 Secondary malignant neoplasm of bone: Secondary | ICD-10-CM

## 2015-06-24 DIAGNOSIS — Z79811 Long term (current) use of aromatase inhibitors: Secondary | ICD-10-CM

## 2015-06-24 DIAGNOSIS — C50919 Malignant neoplasm of unspecified site of unspecified female breast: Secondary | ICD-10-CM

## 2015-06-24 DIAGNOSIS — Z923 Personal history of irradiation: Secondary | ICD-10-CM

## 2015-06-24 DIAGNOSIS — Z79899 Other long term (current) drug therapy: Secondary | ICD-10-CM

## 2015-06-24 DIAGNOSIS — D696 Thrombocytopenia, unspecified: Secondary | ICD-10-CM

## 2015-06-24 DIAGNOSIS — K219 Gastro-esophageal reflux disease without esophagitis: Secondary | ICD-10-CM

## 2015-06-24 DIAGNOSIS — D731 Hypersplenism: Secondary | ICD-10-CM

## 2015-06-24 DIAGNOSIS — R509 Fever, unspecified: Secondary | ICD-10-CM

## 2015-06-24 DIAGNOSIS — R5383 Other fatigue: Secondary | ICD-10-CM

## 2015-06-24 DIAGNOSIS — D6959 Other secondary thrombocytopenia: Secondary | ICD-10-CM

## 2015-06-24 DIAGNOSIS — R634 Abnormal weight loss: Secondary | ICD-10-CM

## 2015-06-24 DIAGNOSIS — R197 Diarrhea, unspecified: Secondary | ICD-10-CM

## 2015-06-24 DIAGNOSIS — E119 Type 2 diabetes mellitus without complications: Secondary | ICD-10-CM

## 2015-06-24 DIAGNOSIS — Z170 Estrogen receptor positive status [ER+]: Secondary | ICD-10-CM | POA: Diagnosis not present

## 2015-06-24 DIAGNOSIS — Z800 Family history of malignant neoplasm of digestive organs: Secondary | ICD-10-CM

## 2015-06-24 DIAGNOSIS — Z9012 Acquired absence of left breast and nipple: Secondary | ICD-10-CM

## 2015-06-24 DIAGNOSIS — Z9221 Personal history of antineoplastic chemotherapy: Secondary | ICD-10-CM

## 2015-06-24 DIAGNOSIS — C787 Secondary malignant neoplasm of liver and intrahepatic bile duct: Secondary | ICD-10-CM

## 2015-06-24 DIAGNOSIS — R112 Nausea with vomiting, unspecified: Secondary | ICD-10-CM

## 2015-06-24 DIAGNOSIS — M129 Arthropathy, unspecified: Secondary | ICD-10-CM

## 2015-06-24 DIAGNOSIS — Z86010 Personal history of colonic polyps: Secondary | ICD-10-CM

## 2015-06-24 MED ORDER — LORAZEPAM 0.5 MG PO TABS: 0.5000 mg | ORAL_TABLET | Freq: Three times a day (TID) | ORAL | Status: AC | PRN

## 2015-06-24 NOTE — Progress Notes (Signed)
[place] @ [facility] [phone]  Fax:([phone]) [place] OB: [birth year]  MR#: [id number]  ZHY#:[id number]  Patient Care Team: Birdie Sons, MD as PCP - General (Family Medicine) Robert Bellow, MD (General Surgery)  CHIEF COMPLAINT:  No chief complaint on file.   Chief Complaint/Problem List  1.  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 Status post Cytoxan, Adriamycin, followed by high dose chemotherapy and stem cell support.  Status post radiation therapy. 1996 2.  Was on Tamoxifen therapy.  3.  Progressive disease with the left supraclavicular lymph node positive on needle aspiration.  4.  Tamoxifen has been discontinued.  5.  Ovarian ablation with Lupron, Femara and radiation to the ovaries starting in December 2003. 6.  On Femara  7.  Rising tumor markers.  CT scan is positive for liver metastases.  And lung metastasesBiopsies positive for metastatic disease (liver biopsy) consistent with breast primary. November, 2014 [place] and progesterone receptor positive.  HER-2 receptor negative.. 8.  Abraxene starting  from September 03, 2013.  9.Patient was taken off Abraxane because of progressive side effect and started on ERIBULIN April 08, 2014        Oncology Flowsheet 03/30/2015 04/14/2015 04/16/2015 04/28/2015 05/12/2015 05/26/2015 06/23/2015  Day, Cycle Day 1, Cycle 2 Day 1, Cycle 3 - Day 8, Cycle 3 Day 1, Cycle 4 Day 8, Cycle 4 -  dexamethasone (DECADRON) IV [ 10 mg ] [ 10 mg ] - [ 10 mg ] [ 10 mg ] [ 10 mg ] -  eriBULin mesylate (HALAVEN) IV 1.1 mg/m2 1.1 mg/m2 - 1.1 mg/m2 2.35 mg 1.1 mg/m2 -  LORazepam (ATIVAN) IV - - - - - - 0.5 mg  ondansetron (ZOFRAN) IV [ 8 mg ] [ 8 mg ] - [ 8 mg ] [ 8 mg ] [ 8 mg ] [ 8 mg ]  pegfilgrastim (NEULASTA ONPRO KIT) [place] 6 mg - - - - - -  pegfilgrastim (NEULASTA) [place] - - 6 mg - - - -    INTERVAL HISTORY: 54 year old lady who has a 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  No abdominal pain.  No nausea.  No vomiting.  No diarrhea.  No bony pains. Mar 04, 2015 Patient is here for ongoing evaluation complains of epigastric discomfort.  Patient is slightly high tumor markers.  Epigastric discomfort is improved with Zofran.  No nausea.  No vomiting.  Patient has stage IV carcinoma of breast March 30, 2015 [facility] here for ongoing evaluation.  Guarding stage IV carcinoma of breast.  Patient is ON CHEMO   WITH ERIBULIN .  Tolerating treatment very well.  Intermittent nausea.  Abdominal pain is improved.  Tumor markers shows progressive decline.  Appearing.  No numbness.. May 12, 2015 Patient is here for ongoing evaluation and continuation of treatment.  Having increasing and persistent nausea for last few days.  Vomited twice.  No abdominal pain.  No discomfort.  No diarrhea. Appetite has been stable. August, 2016 .  16 th   Patient is here for ongoing evaluation and treatment consideration regarding stage IV carcinoma of breast.  Epigastric discomfort and nausea and vomiting is improved.  Patient's CT scan continues to show improvement tumor marker continues to shows improvement.  No nausea no vomiting patient continuing  on a ERIBULIN  therapy  Here for continuation of treatment   June 09, 2015 Patient came today further follow-up.  Feels somewhat weak and tired.  He keep.  Had low-grade fever.  Chills.  Sore throat. Nausea and vomiting early this morning June 23, 2015 Patient's condition has been declining for last few days was started on amoxicillin resulting in 2 nausea vomiting diarrhea.  Diarrhea has improved patient continues to have chills persistent nausea vomiting. Here for further follow-up feeling weak and tired. Centers loss 6 pounds of weight since last evaluation. June 25, 2015 Patient's general condition has improved.  Nausea and vomiting has improved.  All the cultures were negative.  Patient's inciting may be playing a major role.  No abdominal pain today.  REVIEW OF SYSTEMS:   GENERAL:  Patient is feeling weak and tired. Low-grade fever.  Aches and pains PERFORMANCE STATUS (ECOG):   HEENT:  No visual changes, runny nose, sore throat, mouth sores or tenderness. Lungs: No shortness of breath or cough.  No hemoptysis. Cardiac:  No chest pain, palpitations, orthopnea, or PND. GI: intermittent  Nausea  HAS IMPROVED   No abdominal pain. He should not complaints of nausea and vomiting for last 2 days.  Epigastric discomfort.  Taking Nexium once a day GU:  No urgency, frequency, dysuria, or hematuria.June 09, 2015 Musculoskeletal:  No back pain.  No joint pain.  No muscle tenderness. Extremities:  No pain or swelling. Skin:  No rashes or skin changes. Neuro:  No headache, numbness or weakness, balance or coordination issues. Endocrine:  No diabetes, thyroid issues, hot flashes or night sweats. Psych:  No mood changes, depression or anxiety. Pain:  No focal pain. Review of systems:  All other systems reviewed and found to be negative. Epigastric discomfort which is persistent dull aching As per HPI. Otherwise, a complete review of systems is negatve.  PAST MEDICAL HISTORY: Past Medical History  Diagnosis Date  . Arthritis   . Diabetes mellitus without complication   . Colon polyps   . GERD (gastroesophageal reflux disease)   . Cancer 2002     LEFT MASTECTOMY  . Breast cancer, female 58    PAST SURGICAL HISTORY: Past Surgical History  Procedure Laterality Date  . Mastectomy Left 2002  . Colonoscopy  6568,1275    DR.BYRNETT  . Foot surgery Right   . Gastric endoscopy   2002  . Bone marrow transplant    . Cholecystectomy  1995  . Sigmoidoscopy  2013    FAMILY HISTORY Family History  Problem Relation Age of Onset  . Colon cancer Sister    . COPD Mother       ADVANCED DIRECTIVES:  Patient does   Not have advanced health care directive Patient does not have any advanced healthcare directive. Information has been given.     HEALTH MAINTENANCE: Social History  Substance Use Topics  . Smoking status: Never Smoker   . Smokeless tobacco: Never Used  . Alcohol Use: No      Allergies  Allergen Reactions  . Codeine Hives and Nausea Only    Other Reaction: GI Upset  . Latex Itching and Hives  . Shellfish Allergy Swelling    Lips    Current Outpatient Prescriptions  Medication Sig Dispense Refill  . ALPRAZolam (XANAX) 0.25 MG tablet Take 1 tablet (0.25 mg total) by mouth every 8 (eight) hours as needed for anxiety. 30 tablet 3  . amoxicillin-clavulanate (AUGMENTIN) 875-125 MG per tablet Take 1 tablet  by mouth 2 (two) times daily. (Patient not taking: Reported on 06/23/2015) 14 tablet 0  . Ascorbic Acid (VITAMIN C) 1000 MG tablet Take 1,000 mg by mouth daily.    . Calcium Carb-Cholecalciferol 600-400 MG-UNIT TABS Take by mouth.    . calcium carbonate (OS-CAL - DOSED IN MG OF ELEMENTAL CALCIUM) 1250 MG tablet Take 1 tablet by mouth daily.    Marland Kitchen docusate sodium (COLACE) 100 MG capsule Take by mouth.    . esomeprazole (NEXIUM) 40 MG capsule Take 40 mg by mouth 2 (two) times daily before a meal.     . fentaNYL (DURAGESIC - DOSED MCG/HR) 25 MCG/HR patch Place 1 patch (25 mcg total) onto the skin every 3 (three) days. 10 patch 0  . ferrous sulfate 325 (65 FE) MG tablet Take by mouth.    . fexofenadine (ALLEGRA) 180 MG tablet Take 180 mg by mouth daily.    . furosemide (LASIX) 20 MG tablet TAKE 1 TABLET EVERY DAY 30 tablet 4  . glipiZIDE (GLUCOTROL) 5 MG tablet   0  . KLOR-CON 10 10 MEQ tablet TAKE 1 TABLET (10 MEQ TOTAL) BY MOUTH 2 (TWO) TIMES DAILY. 60 tablet 3  . letrozole (FEMARA) 2.5 MG tablet Take 2.5 mg by mouth daily.    Marland Kitchen lidocaine-prilocaine (EMLA) cream   3  . meloxicam (MOBIC) 15 MG tablet Take 15 mg by mouth  daily.    . metFORMIN (GLUCOPHAGE) 500 MG tablet Take 500 mg by mouth 2 (two) times daily with a meal.    . ondansetron (ZOFRAN) 4 MG tablet TAKE 1 TABLET (4 MG TOTAL) BY MOUTH EVERY 6 (SIX) HOURS AS NEEDED FOR NAUSEA OR VOMITING. 30 tablet 3  . oxyCODONE-acetaminophen (PERCOCET/ROXICET) 5-325 MG per tablet Take 1 tablet by mouth every 4 (four) hours as needed for severe pain. 60 tablet 0  . PARoxetine (PAXIL) 40 MG tablet TAKE 1 TABLET ONCE DAILY 30 tablet 3  . polyethylene glycol powder (GLYCOLAX/MIRALAX) powder 255 grams one bottle for colonoscopy prep 255 g 0  . spironolactone (ALDACTONE) 100 MG tablet Take 1 tablet (100 mg total) by mouth daily. 30 tablet 6  . traMADol (ULTRAM) 50 MG tablet Take 50 mg by mouth every 6 (six) hours as needed for pain.    . vitamin E 400 UNIT capsule Take 400 Units by mouth daily.     No current facility-administered medications for this visit.   Facility-Administered Medications Ordered in Other Visits  Medication Dose Route Frequency Provider Last Rate Last Dose  . sodium chloride 0.9 % injection 10 mL  10 mL Intracatheter PRN Forest Gleason, MD   10 mL at 02/18/15 1453    OBJECTIVE:  There were no vitals filed for this visit.   There is no weight on file to calculate BMI.    ECOG FS:1 - Symptomatic but completely ambulatory  PHYSICAL EXAM: GENERAL:  Well developed, well nourished, sitting comfortably in the exam room in no acute distress. MENTAL STATUS:  Alert and oriented to person, place and time. HEAD:alopecia i.  Normocephalic, atraumatic, face symmetric, no Cushingoid features. EYES:  Pupils equal round and reactive to light and accomodation.  No conjunctivitis or scleral icterus. ENT:  Oropharynx clear without lesion.  Tongue normal. Mucous membranes moist.  RESPIRATORY:  Clear to auscultation without rales, wheezes or rhonchi. CARDIOVASCULAR:  Regular rate and rhythm without murmur, rub or gallop. Abdomen: Tenderness in epigastric area.  Liver  is not palpable.  No ascites. Oh palpable mass BACK:  No CVA tenderness.  No tenderness on percussion of the back or rib cage. SKIN:  No rashes, ulcers or lesions. EXTREMITIES: No edema, no skin discoloration or tenderness.  No palpable cords. LYMPH NODES: No palpable cervical, supraclavicular, axillary or inguinal adenopathy  NEUROLOGICAL: Unremarkable. PSYCH:  Appropriate.   LAB RESULTS:  Infusion on 06/23/2015  Component Date Value Ref Range Status  . WBC 06/23/2015 8.6  3.6 - 11.0 K/uL Final   A-LINE DRAW  . RBC 06/23/2015 5.11  3.80 - 5.20 MIL/uL Final  . Hemoglobin 06/23/2015 14.5  12.0 - 16.0 g/dL Final  . HCT 06/23/2015 42.8  35.0 - 47.0 % Final  . MCV 06/23/2015 83.8  80.0 - 100.0 fL Final  . MCH 06/23/2015 28.4  26.0 - 34.0 pg Final  . MCHC 06/23/2015 33.9  32.0 - 36.0 g/dL Final  . RDW 06/23/2015 17.1* 11.5 - 14.5 % Final  . Platelets 06/23/2015 75* 150 - 440 K/uL Final  . Neutrophils Relative % 06/23/2015 76   Final  . Neutro Abs 06/23/2015 6.5  1.4 - 6.5 K/uL Final  . Lymphocytes Relative 06/23/2015 16   Final  . Lymphs Abs 06/23/2015 1.4  1.0 - 3.6 K/uL Final  . Monocytes Relative 06/23/2015 5   Final  . Monocytes Absolute 06/23/2015 0.4  0.2 - 0.9 K/uL Final  . Eosinophils Relative 06/23/2015 2   Final  . Eosinophils Absolute 06/23/2015 0.2  0 - 0.7 K/uL Final  . Basophils Relative 06/23/2015 1   Final  . Basophils Absolute 06/23/2015 0.1  0 - 0.1 K/uL Final  . Sodium 06/23/2015 133* 135 - 145 mmol/L Final  . Potassium 06/23/2015 3.4* 3.5 - 5.1 mmol/L Final  . Chloride 06/23/2015 98* 101 - 111 mmol/L Final  . CO2 06/23/2015 26  22 - 32 mmol/L Final  . Glucose, Bld 06/23/2015 148* 65 - 99 mg/dL Final  . BUN 06/23/2015 11  6 - 20 mg/dL Final  . Creatinine, Ser 06/23/2015 0.79  0.44 - 1.00 mg/dL Final  . Calcium 06/23/2015 8.8* 8.9 - 10.3 mg/dL Final  . Total Protein 06/23/2015 6.9  6.5 - 8.1 g/dL Final  . Albumin 06/23/2015 4.3  3.5 - 5.0 g/dL Final  . AST  06/23/2015 29  15 - 41 U/L Final  . ALT 06/23/2015 30  14 - 54 U/L Final  . Alkaline Phosphatase 06/23/2015 87  38 - 126 U/L Final  . Total Bilirubin 06/23/2015 1.3* 0.3 - 1.2 mg/dL Final  . GFR calc non Af Amer 06/23/2015 >60  >60 mL/min Final  . GFR calc Af Amer 06/23/2015 >60  >60 mL/min Final   Comment: (NOTE) The eGFR has been calculated using the CKD EPI equation. This calculation has not been validated in all clinical situations. eGFR's persistently <60 mL/min signify possible Chronic Kidney Disease.   . Anion gap 06/23/2015 9  5 - 15 Final  Appointment on 06/23/2015  Component Date Value Ref Range Status  . Specimen Description 06/23/2015 URINE, CLEAN CATCH   Final  . Special Requests 06/23/2015 NONE   Final  . Culture 06/23/2015 TOO YOUNG TO READ   Final  . Report Status 06/23/2015 PENDING   Incomplete  . Specimen Description 06/23/2015 BLOOD   Final  . Special Requests 06/23/2015 NONE   Final  . Culture 06/23/2015 NO GROWTH < 24 HOURS   Final  . Report Status 06/23/2015 PENDING   Incomplete  . Specimen Description 06/23/2015 BLOOD   Final  . Special Requests 06/23/2015 NONE  Final  . Culture 06/23/2015 NO GROWTH < 24 HOURS   Final  . Report Status 06/23/2015 PENDING   Incomplete    Lab Results  Component Value Date   LABCA2 28.6 06/09/2015   IMPRESSION: 1. Similar appearance of the liver. 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 The posterior right hepatic lobe dominant lesion is unchanged in size and may represent a hemangioma, given peripheral nodular enhancement. Portal venous hypertension, including periesophageal ascites. 2. No extrahepatic metastatic disease identified. 3. Possible constipation. No explanation for vomiting.   ASSESSMENT: Stage IV carcinoma breast previous multiple chemotherapy programs patient is responding to it ERIBULIN.    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

## 2015-06-25 LAB — URINE CULTURE

## 2015-06-26 ENCOUNTER — Telehealth: Payer: Self-pay | Admitting: *Deleted

## 2015-06-26 ENCOUNTER — Encounter: Payer: Self-pay | Admitting: Oncology

## 2015-06-26 ENCOUNTER — Emergency Department
Admission: EM | Admit: 2015-06-26 | Discharge: 2015-06-26 | Disposition: A | Discharge status: Home / Self Care | Payer: Managed Care, Other (non HMO) | Attending: Emergency Medicine | Admitting: Emergency Medicine

## 2015-06-26 ENCOUNTER — Other Ambulatory Visit: Payer: Self-pay

## 2015-06-26 ENCOUNTER — Encounter: Payer: Self-pay | Admitting: Medical Oncology

## 2015-06-26 DIAGNOSIS — Z79899 Other long term (current) drug therapy: Secondary | ICD-10-CM | POA: Diagnosis not present

## 2015-06-26 DIAGNOSIS — R112 Nausea with vomiting, unspecified: Secondary | ICD-10-CM | POA: Insufficient documentation

## 2015-06-26 DIAGNOSIS — N390 Urinary tract infection, site not specified: Secondary | ICD-10-CM | POA: Insufficient documentation

## 2015-06-26 DIAGNOSIS — E119 Type 2 diabetes mellitus without complications: Secondary | ICD-10-CM | POA: Insufficient documentation

## 2015-06-26 DIAGNOSIS — Z791 Long term (current) use of non-steroidal anti-inflammatories (NSAID): Secondary | ICD-10-CM | POA: Diagnosis not present

## 2015-06-26 DIAGNOSIS — Z91040 Latex allergy status: Secondary | ICD-10-CM | POA: Insufficient documentation

## 2015-06-26 LAB — COMPREHENSIVE METABOLIC PANEL
ALBUMIN: 4.4 g/dL (ref 3.5–5.0)
ALT: 23 U/L (ref 14–54)
ANION GAP: 9 (ref 5–15)
AST: 27 U/L (ref 15–41)
Alkaline Phosphatase: 83 U/L (ref 38–126)
BUN: 9 mg/dL (ref 6–20)
CHLORIDE: 101 mmol/L (ref 101–111)
CO2: 28 mmol/L (ref 22–32)
Calcium: 9.7 mg/dL (ref 8.9–10.3)
Creatinine, Ser: 0.71 mg/dL (ref 0.44–1.00)
GFR calc Af Amer: >60 mL/min (ref >60)
GFR calc non Af Amer: >60 mL/min (ref >60)
GLUCOSE: 149 mg/dL — AB (ref 65–99)
POTASSIUM: 3.7 mmol/L (ref 3.5–5.1)
Sodium: 138 mmol/L (ref 135–145)
Total Bilirubin: 1.4 mg/dL — ABNORMAL HIGH (ref 0.3–1.2)
Total Protein: 7 g/dL (ref 6.5–8.1)

## 2015-06-26 LAB — CBC
HEMATOCRIT: 42.7 % (ref 35.0–47.0)
HEMOGLOBIN: 14.5 g/dL (ref 12.0–16.0)
MCH: 29 pg (ref 26.0–34.0)
MCHC: 33.9 g/dL (ref 32.0–36.0)
MCV: 85.3 fL (ref 80.0–100.0)
Platelets: 72 10*3/uL — ABNORMAL LOW (ref 150–440)
RBC: 5.01 MIL/uL (ref 3.80–5.20)
RDW: 17.4 % — AB (ref 11.5–14.5)
WBC: 8.1 10*3/uL (ref 3.6–11.0)

## 2015-06-26 LAB — URINALYSIS COMPLETE WITH MICROSCOPIC (ARMC ONLY)
Bilirubin Urine: NEGATIVE
GLUCOSE, UA: NEGATIVE mg/dL
HGB URINE DIPSTICK: NEGATIVE
Nitrite: NEGATIVE
PROTEIN: NEGATIVE mg/dL
SPECIFIC GRAVITY, URINE: 1.02 (ref 1.005–1.030)
pH: 6 (ref 5.0–8.0)

## 2015-06-26 LAB — LIPASE, BLOOD: LIPASE: 26 U/L (ref 22–51)

## 2015-06-26 MED ORDER — PROMETHAZINE HCL 25 MG RE SUPP: 25.0000 mg | Freq: Four times a day (QID) | RECTAL | Status: DC | PRN

## 2015-06-26 MED ORDER — SULFAMETHOXAZOLE-TRIMETHOPRIM 800-160 MG PO TABS: 1.0000 | ORAL_TABLET | Freq: Two times a day (BID) | ORAL | Status: DC

## 2015-06-26 MED ORDER — HEPARIN SOD (PORK) LOCK FLUSH 10 UNIT/ML IV SOLN
10.0000 [IU] | Freq: Once | INTRAVENOUS | Status: AC
  Administered 2015-06-26: 10 [IU]
  Filled 2015-06-26: qty 1

## 2015-06-26 MED ORDER — SODIUM CHLORIDE 0.9 % IV BOLUS (SEPSIS)
1000.0000 mL | Freq: Once | INTRAVENOUS | Status: AC
  Administered 2015-06-26: 1000 mL via INTRAVENOUS

## 2015-06-26 MED ORDER — PROCHLORPERAZINE EDISYLATE 5 MG/ML IJ SOLN
10.0000 mg | Freq: Four times a day (QID) | INTRAMUSCULAR | Status: DC | PRN
  Administered 2015-06-26: 10 mg via INTRAVENOUS
  Filled 2015-06-26: qty 2

## 2015-06-26 NOTE — ED Notes (Signed)
Pt presents to ed with reports that she has been vomiting since last Thursday, was seen by Cancer doctor Wednesday and given IV fluids. Pt reports that she continues to vomit. Not having current treatment for CA. Pt denies pain.

## 2015-06-26 NOTE — ED Provider Notes (Signed)
[facility] Emergency Department Provider Note    ____________________________________________  Time seen: 1525  I have reviewed the triage vital signs and the nursing notes.   HISTORY  Chief Complaint Emesis   History limited by: Not Limited   HPI Paula Price is a 54 y.o. female who presents to the emergency department today because of concerns for nausea and vomiting and abdominal discomfort. Patient states that she first started having these symptoms roughly 1 week ago. She is undergoing chemotherapy and followed up with her cancer doctor on Tuesday but the chemotherapy was deferred given the symptoms. She did receive IV fluids and IV Zofran at that time and felt a little better for one day. However today her symptoms did return. She is complaining of pain primarily of the upper abdomen. She is had emesis productive of yellow phlegm. The patient denies any fevers.   Past Medical History  Diagnosis Date  . Arthritis   . Diabetes mellitus without complication   . Colon polyps   . GERD (gastroesophageal reflux disease)   . Cancer 2002     LEFT MASTECTOMY  . Breast cancer, female 1997    Patient Active Problem List   Diagnosis Date Noted  . Breast cancer metastasized to liver 08/28/2013  . Hepatomegaly 08/12/2013  . Adenomatous rectal polyp 08/12/2013  . Abdominal pain, unspecified site 08/12/2013  . Fatigue 08/12/2013  . Rectal polyp 01/09/2000    Past Surgical History  Procedure Laterality Date  . Mastectomy Left 2002  . Colonoscopy  5053,9767    DR.BYRNETT  . Foot surgery Right   . Gastric endoscopy   2002  . Bone marrow transplant    . Cholecystectomy  1995  . Sigmoidoscopy  2013    Current Outpatient Rx  Name  Route  Sig  Dispense  Refill  . Ascorbic Acid (VITAMIN C) 1000 MG tablet   Oral   Take 1,000 mg by mouth daily.         . Calcium Carb-Cholecalciferol 600-400 MG-UNIT TABS   Oral   Take by mouth.         .  calcium carbonate (OS-CAL - DOSED IN MG OF ELEMENTAL CALCIUM) 1250 MG tablet   Oral   Take 1 tablet by mouth daily.         Marland Kitchen esomeprazole (NEXIUM) 40 MG capsule   Oral   Take 40 mg by mouth 2 (two) times daily before a meal.          . fentaNYL (DURAGESIC - DOSED MCG/HR) 25 MCG/HR patch   Transdermal   Place 1 patch (25 mcg total) onto the skin every 3 (three) days.   10 patch   0   . ferrous sulfate 325 (65 FE) MG tablet   Oral   Take by mouth.         . furosemide (LASIX) 20 MG tablet      TAKE 1 TABLET EVERY DAY   30 tablet   4   . glipiZIDE (GLUCOTROL) 5 MG tablet            0   . KLOR-CON 10 10 MEQ tablet      TAKE 1 TABLET (10 MEQ TOTAL) BY MOUTH 2 (TWO) TIMES DAILY.   60 tablet   3   . lidocaine-prilocaine (EMLA) cream            3   . LORazepam (ATIVAN) 0.5 MG tablet   Oral   Take 1 tablet (  0.5 mg total) by mouth 3 (three) times daily as needed for anxiety.   90 tablet   3   . meloxicam (MOBIC) 15 MG tablet   Oral   Take 15 mg by mouth daily.         . ondansetron (ZOFRAN) 4 MG tablet      TAKE 1 TABLET (4 MG TOTAL) BY MOUTH EVERY 6 (SIX) HOURS AS NEEDED FOR NAUSEA OR VOMITING.   30 tablet   3   . oxyCODONE-acetaminophen (PERCOCET/ROXICET) 5-325 MG per tablet   Oral   Take 1 tablet by mouth every 4 (four) hours as needed for severe pain.   60 tablet   0   . PARoxetine (PAXIL) 40 MG tablet      TAKE 1 TABLET ONCE DAILY   30 tablet   3   . polyethylene glycol powder (GLYCOLAX/MIRALAX) powder      255 grams one bottle for colonoscopy prep   255 g   0   . spironolactone (ALDACTONE) 100 MG tablet   Oral   Take 1 tablet (100 mg total) by mouth daily.   30 tablet   6   . vitamin E 400 UNIT capsule   Oral   Take 400 Units by mouth daily.           Allergies Codeine; Latex; and Shellfish allergy  Family History  Problem Relation Age of Onset  . Colon cancer Sister   . COPD Mother     Social History Social  History  Substance Use Topics  . Smoking status: Never Smoker   . Smokeless tobacco: Never Used  . Alcohol Use: No    Review of Systems  Constitutional: Negative for fever. Cardiovascular: Negative for chest pain. Respiratory: Negative for shortness of breath. Gastrointestinal: Positive for abdominal pain, nausea and vomiting. Genitourinary: Negative for dysuria. Musculoskeletal: Negative for back pain. Skin: Negative for rash. Neurological: Negative for headaches, focal weakness or numbness.   10-point ROS otherwise negative.  ____________________________________________   PHYSICAL EXAM:  VITAL SIGNS: ED Triage Vitals  Enc Vitals Group     BP 06/26/15 1440 132/98 mmHg     Pulse Rate 06/26/15 1440 98     Resp 06/26/15 1440 20     Temp 06/26/15 1440 98.1 F (36.7 C)     Temp Source 06/26/15 1440 Oral     SpO2 06/26/15 1440 97 %     Weight 06/26/15 1440 205 lb (92.987 kg)     Height 06/26/15 1440 '5\' 4"'$  (1.626 m)   Constitutional: Alert and oriented. Well appearing and in no distress. Eyes: Conjunctivae are normal. PERRL. Normal extraocular movements. ENT   Head: Normocephalic and atraumatic.   Nose: No congestion/rhinnorhea.   Mouth/Throat: Mucous membranes are moist.   Neck: No stridor. Hematological/Lymphatic/Immunilogical: No cervical lymphadenopathy. Cardiovascular: Normal rate, regular rhythm.  No murmurs, rubs, or gallops. Respiratory: Normal respiratory effort without tachypnea nor retractions. Breath sounds are clear and equal bilaterally. No wheezes/rales/rhonchi. Gastrointestinal: Soft and mildly tender to palpation in the upper abdomen. No distention. There is no CVA tenderness. Genitourinary: Deferred Musculoskeletal: Normal range of motion in all extremities. No joint effusions.  No lower extremity tenderness nor edema. Neurologic:  Normal speech and language. No gross focal neurologic deficits are appreciated. Speech is normal.  Skin:  Skin  is warm, dry and intact. No rash noted. Psychiatric: Mood and affect are normal. Speech and behavior are normal. Patient exhibits appropriate insight and judgment.  ____________________________________________    LABS (  pertinent positives/negatives)  Labs Reviewed  COMPREHENSIVE METABOLIC PANEL - Abnormal; Notable for the following:    Glucose, Bld 149 (*)    Total Bilirubin 1.4 (*)    All other components within normal limits  CBC - Abnormal; Notable for the following:    RDW 17.4 (*)    Platelets 72 (*)    All other components within normal limits  URINALYSIS COMPLETEWITH MICROSCOPIC ([facility] ONLY) - Abnormal; Notable for the following:    Color, Urine YELLOW (*)    APPearance HAZY (*)    Ketones, ur TRACE (*)    Leukocytes, UA 2+ (*)    Bacteria, UA MANY (*)    Squamous Epithelial / LPF 6-30 (*)    All other components within normal limits  LIPASE, BLOOD     ____________________________________________   EKG  I, Nance Pear, attending physician, personally viewed and interpreted this EKG  EKG Time: 1447 Rate: 102 Rhythm: sinus tachycardia Axis: normal Intervals: qtc 453 QRS: narrow, q waves V1, V2 ST changes: no st elevation Impression: hx of septal infarct, abnormal EKG   ____________________________________________    RADIOLOGY  None  ____________________________________________   PROCEDURES  Procedure(s) performed: None  Critical Care performed: No  ____________________________________________   INITIAL IMPRESSION / ASSESSMENT AND PLAN / ED COURSE  Pertinent labs & imaging results that were available during my care of the patient were reviewed by me and considered in my medical decision making (see chart for details).  Patient presented to the emergency department with concerns for nausea and vomiting. Patient did feel better after fluids and antiemetics. Lab work without any concerning findings except for urinalysis lids potentially  showed signs of a urinary tract infection. I discussed with patient she says she has been having a mild amount of dysuria. She has however received prescription for an antibiotic for UTI by her primary care physician. She states that she took 2 days worth. I advised patient to continue the antibiotic. Additionally will send home with Phenergan suppository.  ____________________________________________   FINAL CLINICAL IMPRESSION(S) / ED DIAGNOSES  Final diagnoses:  Nausea and vomiting, vomiting of unspecified type  UTI (lower urinary tract infection)     Nance Pear, MD 06/26/15 1902

## 2015-06-26 NOTE — Discharge Instructions (Signed)
Please seek medical attention for any high fevers, chest pain, shortness of breath, change in behavior, persistent vomiting, bloody stool or any other new or concerning symptoms. ° ° °Nausea and Vomiting °Nausea is a sick feeling that often comes before throwing up (vomiting). Vomiting is a reflex where stomach contents come out of your mouth. Vomiting can cause severe loss of body fluids (dehydration). Children and elderly adults can become dehydrated quickly, especially if they also have diarrhea. Nausea and vomiting are symptoms of a condition or disease. It is important to find the cause of your symptoms. °CAUSES  °· Direct irritation of the stomach lining. This irritation can result from increased acid production (gastroesophageal reflux disease), infection, food poisoning, taking certain medicines (such as nonsteroidal anti-inflammatory drugs), alcohol use, or tobacco use. °· Signals from the brain. These signals could be caused by a headache, heat exposure, an inner ear disturbance, increased pressure in the brain from injury, infection, a tumor, or a concussion, pain, emotional stimulus, or metabolic problems. °· An obstruction in the gastrointestinal tract (bowel obstruction). °· Illnesses such as diabetes, hepatitis, gallbladder problems, appendicitis, kidney problems, cancer, sepsis, atypical symptoms of a heart attack, or eating disorders. °· Medical treatments such as chemotherapy and radiation. °· Receiving medicine that makes you sleep (general anesthetic) during surgery. °DIAGNOSIS °Your caregiver may ask for tests to be done if the problems do not improve after a few days. Tests may also be done if symptoms are severe or if the reason for the nausea and vomiting is not clear. Tests may include: °· Urine tests. °· Blood tests. °· Stool tests. °· Cultures (to look for evidence of infection). °· X-rays or other imaging studies. °Test results can help your caregiver make decisions about treatment or the  need for additional tests. °TREATMENT °You need to stay well hydrated. Drink frequently but in small amounts. You may wish to drink water, sports drinks, clear broth, or eat frozen ice pops or gelatin dessert to help stay hydrated. When you eat, eating slowly may help prevent nausea. There are also some antinausea medicines that may help prevent nausea. °HOME CARE INSTRUCTIONS  °· Take all medicine as directed by your caregiver. °· If you do not have an appetite, do not force yourself to eat. However, you must continue to drink fluids. °· If you have an appetite, eat a normal diet unless your caregiver tells you differently. °¨ Eat a variety of complex carbohydrates (rice, wheat, potatoes, bread), lean meats, yogurt, fruits, and vegetables. °¨ Avoid high-fat foods because they are more difficult to digest. °· Drink enough water and fluids to keep your urine clear or pale yellow. °· If you are dehydrated, ask your caregiver for specific rehydration instructions. Signs of dehydration may include: °¨ Severe thirst. °¨ Dry lips and mouth. °¨ Dizziness. °¨ Dark urine. °¨ Decreasing urine frequency and amount. °¨ Confusion. °¨ Rapid breathing or pulse. °SEEK IMMEDIATE MEDICAL CARE IF:  °· You have blood or brown flecks (like coffee grounds) in your vomit. °· You have black or bloody stools. °· You have a severe headache or stiff neck. °· You are confused. °· You have severe abdominal pain. °· You have chest pain or trouble breathing. °· You do not urinate at least once every 8 hours. °· You develop cold or clammy skin. °· You continue to vomit for longer than 24 to 48 hours. °· You have a fever. °MAKE SURE YOU:  °· Understand these instructions. °· Will watch your condition. °· Will get help right away   if you are not doing well or get worse. Document Released: 09/26/2005 Document Revised: 12/19/2011 Document Reviewed: 02/23/2011 [facility] Patient Information 2015 [place], [place]. This information is not intended to  replace advice given to you by your health care provider. Make sure you discuss any questions you have with your health care provider.

## 2015-06-26 NOTE — ED Notes (Signed)
Pt able to tolerate well PO fluids and crackers with no complaint of increased nausea or urge to vomit.

## 2015-06-26 NOTE — Telephone Encounter (Signed)
Vomiting and can't keep anything down. Per L Herring, AGNP-C patient to go to ER if she can't keep anything down. Cody informed

## 2015-06-28 LAB — CULTURE, BLOOD (ROUTINE X 2)
CULTURE: NO GROWTH
CULTURE: NO GROWTH

## 2015-07-08 ENCOUNTER — Inpatient Hospital Stay: Payer: Managed Care, Other (non HMO)

## 2015-07-08 ENCOUNTER — Inpatient Hospital Stay: Payer: Managed Care, Other (non HMO) | Admitting: Oncology

## 2015-07-08 ENCOUNTER — Telehealth: Payer: Self-pay | Admitting: *Deleted

## 2015-07-08 NOTE — Telephone Encounter (Signed)
Per Dr Paula Price needs to come in to be seen tomorrow Agrees to come in at 10 am and will be sure there is help to get her in car

## 2015-07-08 NOTE — Telephone Encounter (Signed)
She has been falling all day and her son is unable to get her up and in the car to get her here for her appt today. He does not want to call EMS if it is going to cost them anything. He said his father will be home at 4 and can help him at that time. Not sure what to do other than reschedule her appt. Any suggestions?

## 2015-07-09 ENCOUNTER — Inpatient Hospital Stay: Payer: Managed Care, Other (non HMO) | Admitting: Oncology

## 2015-07-10 ENCOUNTER — Emergency Department: Payer: Managed Care, Other (non HMO)

## 2015-07-10 ENCOUNTER — Ambulatory Visit: Payer: Managed Care, Other (non HMO)

## 2015-07-10 ENCOUNTER — Telehealth: Payer: Self-pay | Admitting: *Deleted

## 2015-07-10 ENCOUNTER — Encounter: Payer: Self-pay | Admitting: *Deleted

## 2015-07-10 ENCOUNTER — Emergency Department
Admission: EM | Admit: 2015-07-10 | Discharge: 2015-07-10 | Disposition: A | Discharge status: Home / Self Care | Payer: Managed Care, Other (non HMO) | Attending: Emergency Medicine | Admitting: Emergency Medicine

## 2015-07-10 DIAGNOSIS — Z79899 Other long term (current) drug therapy: Secondary | ICD-10-CM | POA: Diagnosis not present

## 2015-07-10 DIAGNOSIS — F419 Anxiety disorder, unspecified: Secondary | ICD-10-CM | POA: Diagnosis not present

## 2015-07-10 DIAGNOSIS — Z79891 Long term (current) use of opiate analgesic: Secondary | ICD-10-CM | POA: Insufficient documentation

## 2015-07-10 DIAGNOSIS — Y9389 Activity, other specified: Secondary | ICD-10-CM | POA: Insufficient documentation

## 2015-07-10 DIAGNOSIS — R5383 Other fatigue: Secondary | ICD-10-CM | POA: Diagnosis not present

## 2015-07-10 DIAGNOSIS — R5381 Other malaise: Secondary | ICD-10-CM

## 2015-07-10 DIAGNOSIS — W2209XA Striking against other stationary object, initial encounter: Secondary | ICD-10-CM | POA: Diagnosis not present

## 2015-07-10 DIAGNOSIS — E119 Type 2 diabetes mellitus without complications: Secondary | ICD-10-CM | POA: Diagnosis not present

## 2015-07-10 DIAGNOSIS — Z791 Long term (current) use of non-steroidal anti-inflammatories (NSAID): Secondary | ICD-10-CM | POA: Diagnosis not present

## 2015-07-10 DIAGNOSIS — T451X5A Adverse effect of antineoplastic and immunosuppressive drugs, initial encounter: Secondary | ICD-10-CM | POA: Diagnosis not present

## 2015-07-10 DIAGNOSIS — S14109A Unspecified injury at unspecified level of cervical spinal cord, initial encounter: Secondary | ICD-10-CM | POA: Insufficient documentation

## 2015-07-10 DIAGNOSIS — Z91040 Latex allergy status: Secondary | ICD-10-CM | POA: Insufficient documentation

## 2015-07-10 DIAGNOSIS — D849 Immunodeficiency, unspecified: Secondary | ICD-10-CM | POA: Insufficient documentation

## 2015-07-10 DIAGNOSIS — R112 Nausea with vomiting, unspecified: Secondary | ICD-10-CM | POA: Diagnosis not present

## 2015-07-10 DIAGNOSIS — Y998 Other external cause status: Secondary | ICD-10-CM | POA: Diagnosis not present

## 2015-07-10 DIAGNOSIS — Y9289 Other specified places as the place of occurrence of the external cause: Secondary | ICD-10-CM | POA: Diagnosis not present

## 2015-07-10 DIAGNOSIS — C787 Secondary malignant neoplasm of liver and intrahepatic bile duct: Principal | ICD-10-CM

## 2015-07-10 DIAGNOSIS — W19XXXA Unspecified fall, initial encounter: Secondary | ICD-10-CM

## 2015-07-10 DIAGNOSIS — C50919 Malignant neoplasm of unspecified site of unspecified female breast: Secondary | ICD-10-CM

## 2015-07-10 DIAGNOSIS — S0990XA Unspecified injury of head, initial encounter: Secondary | ICD-10-CM | POA: Diagnosis present

## 2015-07-10 LAB — CBC WITH DIFFERENTIAL/PLATELET
Basophils Absolute: 0.1 10*3/uL (ref 0–0.1)
Basophils Relative: 1 %
Eosinophils Absolute: 0.2 10*3/uL (ref 0–0.7)
HCT: 44.6 % (ref 35.0–47.0)
Hemoglobin: 15.1 g/dL (ref 12.0–16.0)
LYMPHS ABS: 1.9 10*3/uL (ref 1.0–3.6)
MCH: 28.6 pg (ref 26.0–34.0)
MCHC: 33.8 g/dL (ref 32.0–36.0)
MCV: 84.7 fL (ref 80.0–100.0)
MONO ABS: 0.6 10*3/uL (ref 0.2–0.9)
Neutro Abs: 5.8 10*3/uL (ref 1.4–6.5)
Neutrophils Relative %: 68 %
PLATELETS: 79 10*3/uL — AB (ref 150–440)
RBC: 5.27 MIL/uL — ABNORMAL HIGH (ref 3.80–5.20)
RDW: 17.9 % — AB (ref 11.5–14.5)
WBC: 8.5 10*3/uL (ref 3.6–11.0)

## 2015-07-10 LAB — COMPREHENSIVE METABOLIC PANEL
ALT: 40 U/L (ref 14–54)
AST: 39 U/L (ref 15–41)
Albumin: 4.1 g/dL (ref 3.5–5.0)
Alkaline Phosphatase: 81 U/L (ref 38–126)
Anion gap: 9 (ref 5–15)
BUN: 14 mg/dL (ref 6–20)
CHLORIDE: 99 mmol/L — AB (ref 101–111)
CO2: 26 mmol/L (ref 22–32)
CREATININE: 0.78 mg/dL (ref 0.44–1.00)
Calcium: 9.7 mg/dL (ref 8.9–10.3)
GFR calc non Af Amer: >60 mL/min (ref >60)
Glucose, Bld: 128 mg/dL — ABNORMAL HIGH (ref 65–99)
POTASSIUM: 3.6 mmol/L (ref 3.5–5.1)
SODIUM: 134 mmol/L — AB (ref 135–145)
Total Bilirubin: 1.5 mg/dL — ABNORMAL HIGH (ref 0.3–1.2)
Total Protein: 6.6 g/dL (ref 6.5–8.1)

## 2015-07-10 LAB — URINALYSIS COMPLETE WITH MICROSCOPIC (ARMC ONLY)
Bilirubin Urine: NEGATIVE
Glucose, UA: NEGATIVE mg/dL
Hgb urine dipstick: NEGATIVE
Nitrite: NEGATIVE
PH: 5 (ref 5.0–8.0)
PROTEIN: NEGATIVE mg/dL
Specific Gravity, Urine: 1.025 (ref 1.005–1.030)

## 2015-07-10 MED ORDER — OXYCODONE-ACETAMINOPHEN 5-325 MG PO TABS: 1.0000 | ORAL_TABLET | ORAL | Status: DC | PRN

## 2015-07-10 MED ORDER — CIPROFLOXACIN HCL 500 MG PO TABS: 500.0000 mg | ORAL_TABLET | Freq: Two times a day (BID) | ORAL | Status: DC

## 2015-07-10 NOTE — Discharge Instructions (Signed)

## 2015-07-10 NOTE — ED Notes (Signed)
Pt states she fell, bilateral knee and hip pain.  10/10 pain.  Patient unable to describe pain. Sitting in wheelchair.

## 2015-07-10 NOTE — ED Notes (Signed)
Pt reports fall this am while walking. Pain to bilateral hips and knees, right thumb.

## 2015-07-10 NOTE — Telephone Encounter (Signed)
Informed that prescription is ready to pick up  

## 2015-07-10 NOTE — ED Provider Notes (Signed)
[facility] Emergency Department Provider Note  ____________________________________________  Time seen: Approximately 12:08 PM  I have reviewed the triage vital signs and the nursing notes.   HISTORY  Chief Complaint Fall    HPI Paula Price is a 54 y.o. female presents to the emergency room status post fall 2 days ago. The patient is a cancer patient has been weakened by her treatment including chemotherapy. She states that she was trying to get up to go to the bathroom 2 nights ago when she "didn't have enough energy to make it" and she fell striking the anterior portion of her head against her dresser. She states that they called the cancer Center the next morning but was then able to be seen at the time due to [place] closure. The symptoms of a headache, blurred vision, weakness, neck pain persisted and when she called this morning she was advised to proceed to the emergency department. The patient states that she consistently has nausea, vomiting, weakness. She denies any recent changes.  The patient and her husband were slightly poor historians as far as current symptoms versus chronic symptoms. There was some uncertainty whether there was an increase of nausea and vomiting status post fall. There was also an uncertainty whether her "double and blurred vision" was a change from baseline.   Past Medical History  Diagnosis Date  . Arthritis   . Diabetes mellitus without complication   . Colon polyps   . GERD (gastroesophageal reflux disease)   . Cancer 2002     LEFT MASTECTOMY  . Breast cancer, female 1997    Patient Active Problem List   Diagnosis Date Noted  . Breast cancer metastasized to liver 08/28/2013  . Hepatomegaly 08/12/2013  . Adenomatous rectal polyp 08/12/2013  . Abdominal pain, unspecified site 08/12/2013  . Fatigue 08/12/2013  . Rectal polyp 01/09/2000    Past Surgical History  Procedure Laterality Date  . Mastectomy  Left 2002  . Colonoscopy  [id number]    [email or web address]  . Foot surgery Right   . Gastric endoscopy   2002  . Bone marrow transplant    . Cholecystectomy  1995  . Sigmoidoscopy  2013    Current Outpatient Rx  Name  Route  Sig  Dispense  Refill  . Ascorbic Acid (VITAMIN C) 1000 MG tablet   Oral   Take 1,000 mg by mouth daily.         . Calcium Carb-Cholecalciferol 600-400 MG-UNIT TABS   Oral   Take by mouth.         . calcium carbonate (OS-CAL - DOSED IN MG OF ELEMENTAL CALCIUM) 1250 MG tablet   Oral   Take 1 tablet by mouth daily.         . ciprofloxacin (CIPRO) 500 MG tablet   Oral   Take 1 tablet (500 mg total) by mouth 2 (two) times daily.   14 tablet   0   . esomeprazole (NEXIUM) 40 MG capsule   Oral   Take 40 mg by mouth 2 (two) times daily before a meal.          . fentaNYL (DURAGESIC - DOSED MCG/HR) 25 MCG/HR patch   Transdermal   Place 1 patch (25 mcg total) onto the skin every 3 (three) days.   10 patch   0   . ferrous sulfate 325 (65 FE) MG tablet   Oral   Take by mouth.         Marland Kitchen  furosemide (LASIX) 20 MG tablet      TAKE 1 TABLET EVERY DAY   30 tablet   4   . glipiZIDE (GLUCOTROL) 5 MG tablet            0   . KLOR-CON 10 10 MEQ tablet      TAKE 1 TABLET (10 MEQ TOTAL) BY MOUTH 2 (TWO) TIMES DAILY.   60 tablet   3   . lidocaine-prilocaine (EMLA) cream            3   . LORazepam (ATIVAN) 0.5 MG tablet   Oral   Take 1 tablet (0.5 mg total) by mouth 3 (three) times daily as needed for anxiety.   90 tablet   3   . meloxicam (MOBIC) 15 MG tablet   Oral   Take 15 mg by mouth daily.         . ondansetron (ZOFRAN) 4 MG tablet      TAKE 1 TABLET (4 MG TOTAL) BY MOUTH EVERY 6 (SIX) HOURS AS NEEDED FOR NAUSEA OR VOMITING.   30 tablet   3   . oxyCODONE-acetaminophen (PERCOCET/ROXICET) 5-325 MG per tablet   Oral   Take 1 tablet by mouth every 4 (four) hours as needed for severe pain.   60 tablet   0   . PARoxetine (PAXIL) 40  MG tablet      TAKE 1 TABLET ONCE DAILY   30 tablet   3   . polyethylene glycol powder (GLYCOLAX/MIRALAX) powder      255 grams one bottle for colonoscopy prep   255 g   0   . promethazine (PHENERGAN) 25 MG suppository   Rectal   Place 1 suppository (25 mg total) rectally every 6 (six) hours as needed for nausea.   12 suppository   1   . spironolactone (ALDACTONE) 100 MG tablet   Oral   Take 1 tablet (100 mg total) by mouth daily.   30 tablet   6   . sulfamethoxazole-trimethoprim (BACTRIM DS,SEPTRA DS) 800-160 MG per tablet   Oral   Take 1 tablet by mouth 2 (two) times daily.   4 tablet   0   . vitamin E 400 UNIT capsule   Oral   Take 400 Units by mouth daily.           Allergies Codeine; Latex; and Shellfish allergy  Family History  Problem Relation Age of Onset  . Colon cancer Sister   . COPD Mother     Social History Social History  Substance Use Topics  . Smoking status: Never Smoker   . Smokeless tobacco: Never Used  . Alcohol Use: No    Review of Systems Constitutional: No fever/chills Eyes: No visual changes. ENT: No sore throat. Cardiovascular: Denies chest pain. Respiratory: Denies shortness of breath. Gastrointestinal: No abdominal pain. She endorses nausea and vomiting..  No diarrhea.  No constipation. Genitourinary: Negative for dysuria. Musculoskeletal: Negative for back pain. Skin: Negative for rash. Neurological: Positive for frontal and occipital headaches, she endorses generalized weakness but denies focal weakness or numbness. Psychiatric:She endorses chronic anxiety. Allergic/Immunilogical: She endorses a suppressed immune system secondary to cancer and chemotherapy. 10-point ROS otherwise negative.  ____________________________________________   PHYSICAL EXAM:  VITAL SIGNS: ED Triage Vitals  Enc Vitals Group     BP 07/10/15 1144 118/88 mmHg     Pulse Rate 07/10/15 1144 110     Resp 07/10/15 1144 18     Temp 07/10/15  1144  98 F (36.7 C)     Temp Source 07/10/15 1144 Oral     SpO2 07/10/15 1144 98 %     Weight 07/10/15 1144 203 lb (92.08 kg)     Height 07/10/15 1144 '5\' 5"'$  (1.651 m)     Head Cir --      Peak Flow --      Pain Score 07/10/15 1141 10     Pain Loc --      Pain Edu? --      Excl. in [place]? --     Constitutional: Alert and oriented. Mildly ill appearing and in no acute distress. Eyes: Conjunctivae are normal. PERRL. EOMI. Head: Atraumatic. Nose: No congestion/rhinnorhea. Mouth/Throat: Mucous membranes are moist.  Oropharynx non-erythematous. Neck: No stridor.  Positive cervical spine tenderness to palpation. Hematological/Lymphatic/Immunilogical: No cervical lymphadenopathy. Cardiovascular: Normal rate, regular rhythm. Grossly normal heart sounds.  Good peripheral circulation. Respiratory: Normal respiratory effort.  No retractions. Lungs CTAB. Gastrointestinal: Soft and nontender. No distention. No abdominal bruits. No CVA tenderness. Musculoskeletal: No lower extremity tenderness nor edema.  No joint effusions. Neurologic:  Normal speech and language. No gross focal neurologic deficits are appreciated. Cranial nerves II through XII grossly intact.  Skin:  Skin is warm, dry and intact. No rash noted. Psychiatric: Mood and affect are normal. Speech and behavior are normal.  ____________________________________________   LABS (all labs ordered are listed, but only abnormal results are displayed)  Labs Reviewed  CBC WITH DIFFERENTIAL/PLATELET - Abnormal; Notable for the following:    RBC 5.27 (*)    RDW 17.9 (*)    Platelets 79 (*)    All other components within normal limits  URINALYSIS COMPLETEWITH MICROSCOPIC ([facility] ONLY) - Abnormal; Notable for the following:    Color, Urine AMBER (*)    APPearance CLEAR (*)    Ketones, ur TRACE (*)    Leukocytes, UA 1+ (*)    Bacteria, UA MANY (*)    Squamous Epithelial / LPF 6-30 (*)    All other components within normal limits   COMPREHENSIVE METABOLIC PANEL - Abnormal; Notable for the following:    Sodium 134 (*)    Chloride 99 (*)    Glucose, Bld 128 (*)    Total Bilirubin 1.5 (*)    All other components within normal limits  URINE CULTURE   ____________________________________________  EKG   ____________________________________________  RADIOLOGY  CT head without contrast Impression: No acute intracranial abnormality  CT C-spine without contrast Impression: No acute cervical spine fracture or dislocation. ____________________________________________   PROCEDURES  Procedure(s) performed: None  Critical Care performed: No  ____________________________________________   INITIAL IMPRESSION / ASSESSMENT AND PLAN / ED COURSE  Pertinent labs & imaging results that were available during my care of the patient were reviewed by me and considered in my medical decision making (see chart for details).  The patient is a 54 year old female who presents to the emergency department status post a fall 2 days ago. She was advised by the cancer center to present here to ensure that there was no intracranial abnormalities such as bleeding from his fall. Initially she was endorsing headache, blurred vision, nausea, vomiting, neck pain. The patient was a slightly poor historian in providing symptom status post her fall versus her chronic complaints. CT scan was obtained of her head and C-spine which were turned with no gross abnormality to either. When these results returned I called the cancer center and talked with Dr. Metro Kung RN discussed patient's chronic versus acute symptoms.  Per the nurse the patient has chronic symptoms of nausea, vomiting, headache, and general malaise and weakness. This secondary to her diagnosis of cancer and subsequent chemotherapy treatments. The patient was recently treated for a UTI with antibiotic therapy and it was thought pertinent to evaluate CBC, CMP, and UA for any abnormalities.  Lab returned positive for urinary tract infection. Lab work was within normal limits. Prescribing Cipro for UTI. We'll send off a culture since this is the third UA that is returned positive in the last 30 days even with antibiotic use. Cancer center to follow up on same.  Discussed with the patient all the findings of the exam, imaging, and laboratory findings. Advised patient that there was no acute findings at this time that symptoms were most likely secondary to her known diagnosis of cancer and side effects of medication. She and her husband verbalized understanding. Advised the patient that there was an appointment set up to return to the cancer center for further evaluation in 3 days' time. Advised patient that if any of her symptoms changed or increased to return to the emergency department. ____________________________________________   FINAL CLINICAL IMPRESSION(S) / ED DIAGNOSES  Final diagnoses:  Fall, initial encounter  Chemotherapy induced nausea and vomiting  Malaise and fatigue      Darletta Moll, PA-C 07/10/15 1615  Nena Polio, MD 07/10/15 [phone]

## 2015-07-10 NOTE — ED Notes (Signed)
Patient transported to CT 

## 2015-07-13 ENCOUNTER — Encounter: Payer: Self-pay | Admitting: Oncology

## 2015-07-13 ENCOUNTER — Inpatient Hospital Stay: Payer: Managed Care, Other (non HMO) | Attending: Oncology | Admitting: Oncology

## 2015-07-13 ENCOUNTER — Ambulatory Visit
Admission: RE | Admit: 2015-07-13 | Discharge: 2015-07-13 | Disposition: A | Discharge status: Home / Self Care | Payer: Managed Care, Other (non HMO) | Source: Ambulatory Visit | Attending: Oncology | Admitting: Oncology

## 2015-07-13 ENCOUNTER — Inpatient Hospital Stay: Payer: Managed Care, Other (non HMO)

## 2015-07-13 VITALS — BP 127/93 | HR 113 | Temp 95.7°F | Wt 201.0 lb

## 2015-07-13 DIAGNOSIS — Z9221 Personal history of antineoplastic chemotherapy: Secondary | ICD-10-CM | POA: Insufficient documentation

## 2015-07-13 DIAGNOSIS — G9389 Other specified disorders of brain: Secondary | ICD-10-CM | POA: Insufficient documentation

## 2015-07-13 DIAGNOSIS — E119 Type 2 diabetes mellitus without complications: Secondary | ICD-10-CM | POA: Insufficient documentation

## 2015-07-13 DIAGNOSIS — C787 Secondary malignant neoplasm of liver and intrahepatic bile duct: Secondary | ICD-10-CM | POA: Diagnosis not present

## 2015-07-13 DIAGNOSIS — Z79811 Long term (current) use of aromatase inhibitors: Secondary | ICD-10-CM | POA: Diagnosis not present

## 2015-07-13 DIAGNOSIS — R112 Nausea with vomiting, unspecified: Secondary | ICD-10-CM

## 2015-07-13 DIAGNOSIS — C770 Secondary and unspecified malignant neoplasm of lymph nodes of head, face and neck: Secondary | ICD-10-CM | POA: Insufficient documentation

## 2015-07-13 DIAGNOSIS — Z9181 History of falling: Secondary | ICD-10-CM | POA: Insufficient documentation

## 2015-07-13 DIAGNOSIS — K219 Gastro-esophageal reflux disease without esophagitis: Secondary | ICD-10-CM | POA: Insufficient documentation

## 2015-07-13 DIAGNOSIS — G936 Cerebral edema: Secondary | ICD-10-CM | POA: Insufficient documentation

## 2015-07-13 DIAGNOSIS — Z170 Estrogen receptor positive status [ER+]: Secondary | ICD-10-CM

## 2015-07-13 DIAGNOSIS — H532 Diplopia: Secondary | ICD-10-CM | POA: Diagnosis not present

## 2015-07-13 DIAGNOSIS — R5383 Other fatigue: Secondary | ICD-10-CM | POA: Insufficient documentation

## 2015-07-13 DIAGNOSIS — R51 Headache: Secondary | ICD-10-CM | POA: Insufficient documentation

## 2015-07-13 DIAGNOSIS — R531 Weakness: Secondary | ICD-10-CM | POA: Diagnosis not present

## 2015-07-13 DIAGNOSIS — M129 Arthropathy, unspecified: Secondary | ICD-10-CM | POA: Insufficient documentation

## 2015-07-13 DIAGNOSIS — C50919 Malignant neoplasm of unspecified site of unspecified female breast: Secondary | ICD-10-CM | POA: Diagnosis present

## 2015-07-13 DIAGNOSIS — R29810 Facial weakness: Secondary | ICD-10-CM

## 2015-07-13 DIAGNOSIS — Z923 Personal history of irradiation: Secondary | ICD-10-CM

## 2015-07-13 DIAGNOSIS — M542 Cervicalgia: Secondary | ICD-10-CM | POA: Insufficient documentation

## 2015-07-13 DIAGNOSIS — C7951 Secondary malignant neoplasm of bone: Secondary | ICD-10-CM

## 2015-07-13 DIAGNOSIS — Z86010 Personal history of colonic polyps: Secondary | ICD-10-CM | POA: Insufficient documentation

## 2015-07-13 DIAGNOSIS — Z79899 Other long term (current) drug therapy: Secondary | ICD-10-CM | POA: Insufficient documentation

## 2015-07-13 DIAGNOSIS — Z9012 Acquired absence of left breast and nipple: Secondary | ICD-10-CM

## 2015-07-13 DIAGNOSIS — R509 Fever, unspecified: Secondary | ICD-10-CM | POA: Insufficient documentation

## 2015-07-13 DIAGNOSIS — R188 Other ascites: Secondary | ICD-10-CM

## 2015-07-13 DIAGNOSIS — C7949 Secondary malignant neoplasm of other parts of nervous system: Secondary | ICD-10-CM | POA: Insufficient documentation

## 2015-07-13 DIAGNOSIS — C7932 Secondary malignant neoplasm of cerebral meninges: Secondary | ICD-10-CM | POA: Insufficient documentation

## 2015-07-13 DIAGNOSIS — K766 Portal hypertension: Secondary | ICD-10-CM | POA: Diagnosis not present

## 2015-07-13 DIAGNOSIS — C7800 Secondary malignant neoplasm of unspecified lung: Secondary | ICD-10-CM | POA: Insufficient documentation

## 2015-07-13 DIAGNOSIS — C50912 Malignant neoplasm of unspecified site of left female breast: Secondary | ICD-10-CM | POA: Insufficient documentation

## 2015-07-13 LAB — URINE CULTURE

## 2015-07-13 MED ORDER — SODIUM CHLORIDE 0.9 % IV SOLN
Freq: Once | INTRAVENOUS | Status: AC
  Administered 2015-07-13: 15:00:00 via INTRAVENOUS
  Filled 2015-07-13: qty 1000

## 2015-07-13 MED ORDER — GADOBENATE DIMEGLUMINE 529 MG/ML IV SOLN: 20.0000 mL | Freq: Once | INTRAVENOUS | Status: DC | PRN

## 2015-07-13 MED ORDER — SODIUM CHLORIDE 0.9 % IJ SOLN
10.0000 mL | INTRAMUSCULAR | Status: DC | PRN
  Administered 2015-07-13: 10 mL
  Filled 2015-07-13: qty 10

## 2015-07-13 MED ORDER — SODIUM CHLORIDE 0.9 % IV SOLN
Freq: Once | INTRAVENOUS | Status: AC
  Administered 2015-07-13: 15:00:00 via INTRAVENOUS
  Filled 2015-07-13: qty 4

## 2015-07-13 MED ORDER — HEPARIN SOD (PORK) LOCK FLUSH 100 UNIT/ML IV SOLN
500.0000 [IU] | Freq: Once | INTRAVENOUS | Status: AC
  Administered 2015-07-13: 500 [IU] via INTRAVENOUS
  Filled 2015-07-13: qty 5

## 2015-07-13 NOTE — Progress Notes (Addendum)
Addendum All the records from the emergency room had been reviewed CT scan without contrast of the head has been reviewed which was done him in emergency room CT scan has been reviewed independently

## 2015-07-13 NOTE — Progress Notes (Signed)
[place] @ [facility] [phone]  Fax:([phone] [place] OB: Feb 16, 1961  MR#: [id number]  MEB#:[id number]  Patient Care Team: Pcp Not In System as PCP - General Robert Bellow, MD (General Surgery)  CHIEF COMPLAINT:  Chief Complaint  Patient presents with  . OTHER    Chief Complaint/Problem List  1.  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 Status post Cytoxan, Adriamycin, followed by high dose chemotherapy and stem cell support.  Status post radiation therapy. 1996 2.  Was on Tamoxifen therapy.  3.  Progressive disease with the left supraclavicular lymph node positive on needle aspiration.  4.  Tamoxifen has been discontinued.  5.  Ovarian ablation with Lupron, Femara and radiation to the ovaries starting in December 2003. 6.  On Femara  7.  Rising tumor markers.  CT scan is positive for liver metastases.  And lung metastasesBiopsies positive for metastatic disease (liver biopsy) consistent with breast primary. November, 2014 [place] and progesterone receptor positive.  HER-2 receptor negative.. 8.  Abraxene starting  from September 03, 2013.  9.Patient was taken off Abraxane because of progressive side effect and started on ERIBULIN April 08, 2014        Oncology Flowsheet 04/14/2015 04/16/2015 04/28/2015 05/12/2015 05/26/2015 06/23/2015 06/26/2015  Day, Cycle Day 1, Cycle 3 - Day 8, Cycle 3 Day 1, Cycle 4 Day 8, Cycle 4 - -  dexamethasone (DECADRON) IV [ 10 mg ] - [ 10 mg ] [ 10 mg ] [ 10 mg ] - -  eriBULin mesylate (HALAVEN) IV 1.1 mg/m2 - 1.1 mg/m2 2.35 mg 1.1 mg/m2 - -  LORazepam (ATIVAN) IV - - - - - 0.5 mg -  ondansetron (ZOFRAN) IV [ 8 mg ] - [ 8 mg ] [ 8 mg ] [ 8 mg ] [ 8 mg ] -  pegfilgrastim (NEULASTA ONPRO KIT) [place] - - - - - - -  pegfilgrastim (NEULASTA) [place] - 6 mg - - - - -  prochlorperazine (COMPAZINE) IV - - - - - - 10 mg    INTERVAL HISTORY: 54 year old lady who has a 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 Persistent nausea vomiting Continuing headache and neck pain Patient started having diplopia over last 48 hours Facial drooping Patient was in emergency room with CAT scan without contrast did not reveal any evidence of metastases patient is here for further follow-up Continues to have weakness in lower extremity Frequent fall     REVIEW OF SYSTEMS:   GENERAL:  Patient is feeling weak and tired.  Frequent fall Low-grade fever.  Aches and pains PERFORMANCE STATUS (ECOG):   HEENT:  No visual changes, runny nose, sore throat, mouth sores or tenderness. Lungs: No shortness of breath or cough.  No hemoptysis. Cardiac:  No chest pain, palpitations, orthopnea, or PND. GI: intermittent  Nausea and vomiting. He should not complaints of nausea and vomiting for last 2 days.  Epigastric discomfort.  Taking Nexium once a day GU:  No urgency, frequency, dysuria, or hematuria.June 09, 2015 Musculoskeletal:  No back pain.  No joint pain.  No muscle tenderness. Extremities:  No pain or swelling. Skin:  No rashes or skin changes. Neuro:  No headache, numbness or weakness, balance or coordination issues. Endocrine:  No diabetes, thyroid issues, hot flashes or night sweats. Psych:  No mood changes, depression or anxiety. Pain: Neck pain Review of systems:  All other  systems reviewed and found to be negative. Epigastric discomfort which is persistent dull aching As per HPI. Otherwise, a complete review of systems is negatve.  PAST MEDICAL HISTORY: Past Medical History  Diagnosis Date  . Arthritis   . Diabetes mellitus without complication ([place])   . Colon polyps   . GERD (gastroesophageal reflux disease)   . Cancer ([place]) 2002     LEFT MASTECTOMY  . Breast cancer, female [facility]) 1997    PAST SURGICAL HISTORY: Past Surgical History  Procedure Laterality Date  . Mastectomy Left 2002  . Colonoscopy  [id number]    [email or web address]  . Foot surgery Right     . Gastric endoscopy   2002  . Bone marrow transplant    . Cholecystectomy  1995  . Sigmoidoscopy  2013    FAMILY HISTORY Family History  Problem Relation Age of Onset  . Colon cancer Sister   . COPD Mother       ADVANCED DIRECTIVES:  Patient does   Not have advanced health care directive Patient does not have any advanced healthcare directive. Information has been given.     HEALTH MAINTENANCE: Social History  Substance Use Topics  . Smoking status: Never Smoker   . Smokeless tobacco: Never Used  . Alcohol Use: No      Allergies  Allergen Reactions  . Codeine Hives and Nausea Only    Other Reaction: GI Upset  . Latex Itching and Hives  . Shellfish Allergy Swelling    Lips    Current Outpatient Prescriptions  Medication Sig Dispense Refill  . ciprofloxacin (CIPRO) 500 MG tablet Take 1 tablet (500 mg total) by mouth 2 (two) times daily. 14 tablet 0  . esomeprazole (NEXIUM) 40 MG capsule Take 40 mg by mouth 2 (two) times daily before a meal.     . fentaNYL (DURAGESIC - DOSED MCG/HR) 25 MCG/HR patch Place 1 patch (25 mcg total) onto the skin every 3 (three) days. 10 patch 0  . ferrous sulfate 325 (65 FE) MG tablet Take by mouth.    . furosemide (LASIX) 20 MG tablet TAKE 1 TABLET EVERY DAY 30 tablet 4  . glipiZIDE (GLUCOTROL) 5 MG tablet   0  . KLOR-CON 10 10 MEQ tablet TAKE 1 TABLET (10 MEQ TOTAL) BY MOUTH 2 (TWO) TIMES DAILY. 60 tablet 3  . lidocaine-prilocaine (EMLA) cream   3  . loratadine (CLARITIN) 10 MG tablet Take 10 mg by mouth daily.    Marland Kitchen LORazepam (ATIVAN) 0.5 MG tablet Take 1 tablet (0.5 mg total) by mouth 3 (three) times daily as needed for anxiety. 90 tablet 3  . meloxicam (MOBIC) 15 MG tablet Take 15 mg by mouth daily.    . ondansetron (ZOFRAN) 4 MG tablet TAKE 1 TABLET (4 MG TOTAL) BY MOUTH EVERY 6 (SIX) HOURS AS NEEDED FOR NAUSEA OR VOMITING. 30 tablet 3  . oxyCODONE-acetaminophen (PERCOCET/ROXICET) 5-325 MG tablet Take 1 tablet by mouth every 4  (four) hours as needed for severe pain. 60 tablet 0  . PARoxetine (PAXIL) 40 MG tablet TAKE 1 TABLET ONCE DAILY 30 tablet 3  . polyethylene glycol powder (GLYCOLAX/MIRALAX) powder 255 grams one bottle for colonoscopy prep 255 g 0  . promethazine (PHENERGAN) 25 MG suppository Place 1 suppository (25 mg total) rectally every 6 (six) hours as needed for nausea. 12 suppository 1  . spironolactone (ALDACTONE) 100 MG tablet Take 1 tablet (100 mg total) by mouth daily. 30 tablet 6  . vitamin E 400 UNIT capsule  Take 400 Units by mouth daily.    . Ascorbic Acid (VITAMIN C) 1000 MG tablet Take 1,000 mg by mouth daily.    . Calcium Carb-Cholecalciferol 600-400 MG-UNIT TABS Take by mouth.    . calcium carbonate (OS-CAL - DOSED IN MG OF ELEMENTAL CALCIUM) 1250 MG tablet Take 1 tablet by mouth daily.    Marland Kitchen sulfamethoxazole-trimethoprim (BACTRIM DS,SEPTRA DS) 800-160 MG per tablet Take 1 tablet by mouth 2 (two) times daily. (Patient not taking: Reported on 07/13/2015) 4 tablet 0   No current facility-administered medications for this visit.   Facility-Administered Medications Ordered in Other Visits  Medication Dose Route Frequency Provider Last Rate Last Dose  . gadobenate dimeglumine (MULTIHANCE) injection 20 mL  20 mL Intravenous Once PRN Medication Radiologist, MD      . sodium chloride 0.9 % injection 10 mL  10 mL Intracatheter PRN Forest Gleason, MD   10 mL at 02/18/15 1453    OBJECTIVE:  Filed Vitals:   07/13/15 1157  BP: 127/93  Pulse: 113  Temp: 95.7 F (35.4 C)     There is no weight on file to calculate BMI.    ECOG FS:1 - Symptomatic but completely ambulatory  PHYSICAL EXAM: GENERAL:  Well developed, well nourished, sitting comfortably in the exam room in no acute distress. MENTAL STATUS:  Alert and oriented to person, place and time. HEAD:alopecia i.  Normocephalic, atraumatic, face symmetric, no Cushingoid features. EYES:  Pupils equal round and reactive to light and accomodation.  No  conjunctivitis or scleral icterus. ENT:  Oropharynx clear without lesion.  Tongue normal. Mucous membranes moist.  RESPIRATORY:  Clear to auscultation without rales, wheezes or rhonchi. CARDIOVASCULAR:  Regular rate and rhythm without murmur, rub or gallop. Abdomen: Tenderness in epigastric area.  Liver is not palpable.  No ascites. Oh palpable mass BACK:  No CVA tenderness.  No tenderness on percussion of the back or rib cage. SKIN:  No rashes, ulcers or lesions. EXTREMITIES: No edema, no skin discoloration or tenderness.  No palpable cords. LYMPH NODES: No palpable cervical, supraclavicular, axillary or inguinal adenopathy  NEUROLOGICAL: Unremarkable. PSYCH:  Appropriate. Neurological system: 6 nerve  palsy on the right side.  Possibility of right 7 nerve  Palsy Weakness in both lower extremity.  Complete examination is difficult.                    LAB RESULTS:  No visits with results within 3 Day(s) from this visit. Latest known visit with results is:  Admission on 07/10/2015, Discharged on 07/10/2015  Component Date Value Ref Range Status  . WBC 07/10/2015 8.5  3.6 - 11.0 K/uL Final  . RBC 07/10/2015 5.27* 3.80 - 5.20 MIL/uL Final  . Hemoglobin 07/10/2015 15.1  12.0 - 16.0 g/dL Final  . HCT 07/10/2015 44.6  35.0 - 47.0 % Final  . MCV 07/10/2015 84.7  80.0 - 100.0 fL Final  . MCH 07/10/2015 28.6  26.0 - 34.0 pg Final  . MCHC 07/10/2015 33.8  32.0 - 36.0 g/dL Final  . RDW 07/10/2015 17.9* 11.5 - 14.5 % Final  . Platelets 07/10/2015 79* 150 - 440 K/uL Final  . Neutrophils Relative % 07/10/2015 68%   Final  . Neutro Abs 07/10/2015 5.8  1.4 - 6.5 K/uL Final  . Lymphocytes Relative 07/10/2015 22%   Final  . Lymphs Abs 07/10/2015 1.9  1.0 - 3.6 K/uL Final  . Monocytes Relative 07/10/2015 7%   Final  . Monocytes Absolute 07/10/2015 0.6  0.2 - 0.9 K/uL Final  . Eosinophils Relative 07/10/2015 2%   Final  . Eosinophils Absolute 07/10/2015 0.2  0 - 0.7 K/uL Final  . Basophils  Relative 07/10/2015 1%   Final  . Basophils Absolute 07/10/2015 0.1  0 - 0.1 K/uL Final  . Color, Urine 07/10/2015 AMBER* YELLOW Final  . APPearance 07/10/2015 CLEAR* CLEAR Final  . Glucose, UA 07/10/2015 NEGATIVE  NEGATIVE mg/dL Final  . Bilirubin Urine 07/10/2015 NEGATIVE  NEGATIVE Final  . Ketones, ur 07/10/2015 TRACE* NEGATIVE mg/dL Final  . Specific Gravity, Urine 07/10/2015 1.025  1.005 - 1.030 Final  . Hgb urine dipstick 07/10/2015 NEGATIVE  NEGATIVE Final  . pH 07/10/2015 5.0  5.0 - 8.0 Final  . Protein, ur 07/10/2015 NEGATIVE  NEGATIVE mg/dL Final  . Nitrite 07/10/2015 NEGATIVE  NEGATIVE Final  . Leukocytes, UA 07/10/2015 1+* NEGATIVE Final  . RBC / HPF 07/10/2015 0-5  0 - 5 RBC/hpf Final  . WBC, UA 07/10/2015 6-30  0 - 5 WBC/hpf Final  . Bacteria, UA 07/10/2015 MANY* NONE SEEN Final  . Squamous Epithelial / LPF 07/10/2015 6-30* NONE SEEN Final  . Mucous 07/10/2015 PRESENT   Final  . Hyaline Casts, UA 07/10/2015 PRESENT   Final  . Ca Oxalate Crys, UA 07/10/2015 PRESENT   Final  . Sodium 07/10/2015 134* 135 - 145 mmol/L Final  . Potassium 07/10/2015 3.6  3.5 - 5.1 mmol/L Final  . Chloride 07/10/2015 99* 101 - 111 mmol/L Final  . CO2 07/10/2015 26  22 - 32 mmol/L Final  . Glucose, Bld 07/10/2015 128* 65 - 99 mg/dL Final  . BUN 07/10/2015 14  6 - 20 mg/dL Final  . Creatinine, Ser 07/10/2015 0.78  0.44 - 1.00 mg/dL Final  . Calcium 07/10/2015 9.7  8.9 - 10.3 mg/dL Final  . Total Protein 07/10/2015 6.6  6.5 - 8.1 g/dL Final  . Albumin 07/10/2015 4.1  3.5 - 5.0 g/dL Final  . AST 07/10/2015 39  15 - 41 U/L Final  . ALT 07/10/2015 40  14 - 54 U/L Final  . Alkaline Phosphatase 07/10/2015 81  38 - 126 U/L Final  . Total Bilirubin 07/10/2015 1.5* 0.3 - 1.2 mg/dL Final  . GFR calc non Af Amer 07/10/2015 >60  >60 mL/min Final  . GFR calc Af Amer 07/10/2015 >60  >60 mL/min Final   Comment: (NOTE) The eGFR has been calculated using the CKD EPI equation. This calculation has not been  validated in all clinical situations. eGFR's persistently <60 mL/min signify possible Chronic Kidney Disease.   . Anion gap 07/10/2015 9  5 - 15 Final  . Specimen Description 07/10/2015 URINE, CLEAN CATCH   Final  . Special Requests 07/10/2015 None Immunocompromised   Final  . Culture 07/10/2015 MULTIPLE SPECIES PRESENT, SUGGEST RECOLLECTION   Final  . Report Status 07/10/2015 07/13/2015 FINAL   Final    Lab Results  Component Value Date   LABCA2 28.6 06/09/2015   IMPRESSION: 1. Similar appearance of the liver. 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 The posterior right hepatic lobe dominant lesion is unchanged in size and may represent a hemangioma, given peripheral nodular enhancement. Portal venous hypertension, including periesophageal ascites. 2. No extrahepatic metastatic disease identified. 3. Possible constipation. No explanation for vomiting.   ASSESSMENT: New onset of neurological symptoms and persistent nausea and vomiting Suggestive of either metastases disease to brain or leptomeningeal disease. Proceed with MRI scan of brain with  contrast if it is negative then spinal tap with psychological evaluation Hold all chemotherapy at present time Patient was advised admission in the hospital but she did not want to go to hospital I talked to patient's caregiver regarding walking with the help of walker and a white fall. I will review MRI scan and arrange for spinal tap if needed Discussed  situation with interventional radiologist Discussed situation with family         Breast cancer metastasized to liver   Staging form: Breast, AJCC 7th Edition     Clinical: Stage IV (T2, N3, M1) - Signed by Forest Gleason, MD on 02/18/2015   Forest Gleason, MD   07/13/2015 1:33 PM

## 2015-07-14 ENCOUNTER — Ambulatory Visit
Admission: RE | Admit: 2015-07-14 | Discharge: 2015-07-14 | Disposition: A | Discharge status: Home / Self Care | Payer: Managed Care, Other (non HMO) | Source: Ambulatory Visit | Attending: Oncology | Admitting: Oncology

## 2015-07-14 ENCOUNTER — Ambulatory Visit: Payer: Managed Care, Other (non HMO)

## 2015-07-14 ENCOUNTER — Inpatient Hospital Stay (HOSPITAL_BASED_OUTPATIENT_CLINIC_OR_DEPARTMENT_OTHER): Payer: Managed Care, Other (non HMO) | Admitting: Oncology

## 2015-07-14 DIAGNOSIS — G936 Cerebral edema: Secondary | ICD-10-CM

## 2015-07-14 DIAGNOSIS — R188 Other ascites: Secondary | ICD-10-CM

## 2015-07-14 DIAGNOSIS — K766 Portal hypertension: Secondary | ICD-10-CM

## 2015-07-14 DIAGNOSIS — C7932 Secondary malignant neoplasm of cerebral meninges: Secondary | ICD-10-CM | POA: Diagnosis not present

## 2015-07-14 DIAGNOSIS — C50912 Malignant neoplasm of unspecified site of left female breast: Secondary | ICD-10-CM | POA: Diagnosis not present

## 2015-07-14 DIAGNOSIS — C50919 Malignant neoplasm of unspecified site of unspecified female breast: Secondary | ICD-10-CM

## 2015-07-14 DIAGNOSIS — E119 Type 2 diabetes mellitus without complications: Secondary | ICD-10-CM

## 2015-07-14 DIAGNOSIS — Z86010 Personal history of colonic polyps: Secondary | ICD-10-CM

## 2015-07-14 DIAGNOSIS — K219 Gastro-esophageal reflux disease without esophagitis: Secondary | ICD-10-CM

## 2015-07-14 DIAGNOSIS — Z79899 Other long term (current) drug therapy: Secondary | ICD-10-CM

## 2015-07-14 DIAGNOSIS — M542 Cervicalgia: Secondary | ICD-10-CM

## 2015-07-14 DIAGNOSIS — C787 Secondary malignant neoplasm of liver and intrahepatic bile duct: Secondary | ICD-10-CM | POA: Diagnosis not present

## 2015-07-14 DIAGNOSIS — Z170 Estrogen receptor positive status [ER+]: Secondary | ICD-10-CM

## 2015-07-14 DIAGNOSIS — H532 Diplopia: Secondary | ICD-10-CM

## 2015-07-14 DIAGNOSIS — Z9181 History of falling: Secondary | ICD-10-CM

## 2015-07-14 DIAGNOSIS — G038 Meningitis due to other specified causes: Secondary | ICD-10-CM

## 2015-07-14 DIAGNOSIS — R29810 Facial weakness: Secondary | ICD-10-CM

## 2015-07-14 DIAGNOSIS — R5383 Other fatigue: Secondary | ICD-10-CM

## 2015-07-14 DIAGNOSIS — R509 Fever, unspecified: Secondary | ICD-10-CM

## 2015-07-14 DIAGNOSIS — R51 Headache: Secondary | ICD-10-CM

## 2015-07-14 DIAGNOSIS — Z923 Personal history of irradiation: Secondary | ICD-10-CM

## 2015-07-14 DIAGNOSIS — M129 Arthropathy, unspecified: Secondary | ICD-10-CM

## 2015-07-14 DIAGNOSIS — C7800 Secondary malignant neoplasm of unspecified lung: Secondary | ICD-10-CM

## 2015-07-14 DIAGNOSIS — R531 Weakness: Secondary | ICD-10-CM

## 2015-07-14 DIAGNOSIS — R112 Nausea with vomiting, unspecified: Secondary | ICD-10-CM

## 2015-07-14 DIAGNOSIS — Z79811 Long term (current) use of aromatase inhibitors: Secondary | ICD-10-CM | POA: Diagnosis not present

## 2015-07-14 DIAGNOSIS — Z9221 Personal history of antineoplastic chemotherapy: Secondary | ICD-10-CM

## 2015-07-14 DIAGNOSIS — Z9012 Acquired absence of left breast and nipple: Secondary | ICD-10-CM

## 2015-07-14 DIAGNOSIS — C7951 Secondary malignant neoplasm of bone: Secondary | ICD-10-CM

## 2015-07-14 DIAGNOSIS — C770 Secondary and unspecified malignant neoplasm of lymph nodes of head, face and neck: Secondary | ICD-10-CM

## 2015-07-14 HISTORY — DX: Reserved for inherently not codable concepts without codable children: IMO0001

## 2015-07-14 HISTORY — DX: Headache, unspecified: R51.9

## 2015-07-14 HISTORY — DX: Headache: R51

## 2015-07-14 LAB — PROTIME-INR
INR: 1.09
PROTHROMBIN TIME: 14.3 s (ref 11.4–15.0)

## 2015-07-14 LAB — CSF CELL COUNT WITH DIFFERENTIAL
EOS CSF: 0 %
EOS CSF: 0 %
LYMPHS CSF: 79 %
LYMPHS CSF: 66 %
Monocyte-Macrophage-Spinal Fluid: 21 %
Monocyte-Macrophage-Spinal Fluid: 34 %
OTHER CELLS CSF: 0
OTHER CELLS CSF: 0
RBC Count, CSF: 0 /mm3 (ref 0–3)
RBC Count, CSF: 0 /mm3 (ref 0–3)
SEGMENTED NEUTROPHILS-CSF: 0 %
Segmented Neutrophils-CSF: 0 %
TUBE #: 1
TUBE #: 3
WBC, CSF: 56 /mm3
WBC, CSF: 58 /mm3

## 2015-07-14 LAB — PATHOLOGIST SMEAR REVIEW

## 2015-07-14 LAB — PROTEIN, CSF: Total  Protein, CSF: 477 mg/dL — ABNORMAL HIGH (ref 15–45)

## 2015-07-14 LAB — GLUCOSE, CSF: Glucose, CSF: 43 mg/dL (ref 40–70)

## 2015-07-14 LAB — APTT: aPTT: 45 s — ABNORMAL HIGH (ref 24–36)

## 2015-07-14 MED ORDER — AMLODIPINE BESYLATE 5 MG PO TABS
10.0000 mg | ORAL_TABLET | Freq: Once | ORAL | Status: AC
  Administered 2015-07-14: 10 mg via ORAL
  Filled 2015-07-14: qty 2

## 2015-07-14 MED ORDER — LORAZEPAM 0.5 MG PO TABS
1.0000 mg | ORAL_TABLET | Freq: Once | ORAL | Status: AC
  Administered 2015-07-14: 1 mg via ORAL
  Filled 2015-07-14: qty 2

## 2015-07-14 NOTE — Progress Notes (Signed)
Spoke with Dr. Oliva Bustard (who had spoken to Dr. Martinique).  Pt. received Norvasc 10 mg and Lorazepam 1 mg po in cancer center. Will return to [facility]. Will monitor BP and proceed with procedure when BP lowers. Approved by Dr. Martinique.

## 2015-07-14 NOTE — OR Nursing (Signed)
Dr Martinique called regarding length of stay post LP, 4 hours ordered but he said ok to send home at 13:30

## 2015-07-14 NOTE — Progress Notes (Signed)
Pre-procedure BP-133/102,134/106, and 132/104.  Taken in various locations.  Dr. Martinique notified.  Procedure cancelled.

## 2015-07-15 ENCOUNTER — Inpatient Hospital Stay: Payer: Managed Care, Other (non HMO) | Admitting: Oncology

## 2015-07-18 ENCOUNTER — Encounter: Payer: Self-pay | Admitting: Oncology

## 2015-07-18 NOTE — Progress Notes (Addendum)
Char[place] @ [facility] [phone]  [phone]) [place] OB: 1961/09/18  MR#: [id number]  QMV#:[id number]  Patient Care Team: Pcp Not In System as PCP - General Robert Bellow, MD (General Surgery)  CHIEF COMPLAINT:  No chief complaint on file.   Chief Complaint/Problem List  1.  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 Status post Cytoxan, Adriamycin, followed by high dose chemotherapy and stem cell support.  Status post radiation therapy. 1996 2.  Was on Tamoxifen therapy.  3.  Progressive disease with the left supraclavicular lymph node positive on needle aspiration.  4.  Tamoxifen has been discontinued.  5.  Ovarian ablation with Lupron, Femara and radiation to the ovaries starting in December 2003. 6.  On Femara  7.  Rising tumor markers.  CT scan is positive for liver metastases.  And lung metastasesBiopsies positive for metastatic disease (liver biopsy) consistent with breast primary. November, 2014 [place] and progesterone receptor positive.  HER-2 receptor negative.. 8.  Abraxene starting  from September 03, 2013.  9.Patient was taken off Abraxane because of progressive side effect and started on ERIBULIN April 08, 2014    10.  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 (July 14, 2015)    Oncology Flowsheet 04/28/2015 05/12/2015 05/26/2015 06/23/2015 06/26/2015 07/13/2015 07/14/2015  Day, Cycle Day 8, Cycle 3 Day 1, Cycle 4 Day 8, Cycle 4 - - - -  dexamethasone (DECADRON) IV [ 10 mg ] [ 10 mg ] [ 10 mg ] - - [ 8 mg ] -  eriBULin mesylate (HALAVEN) IV 1.1 mg/m2 2.35 mg 1.1 mg/m2 - - - -  LORazepam (ATIVAN) IV - - - 0.5 mg - - -  LORazepam (ATIVAN) PO - - - - - - 1 mg  ondansetron (ZOFRAN) IV [ 8 mg ] [ 8 mg ] [ 8 mg ] [ 8 mg ] - [ 8 mg ] -  pegfilgrastim (NEULASTA ONPRO KIT) [place] - - - - - - -  pegfilgrastim (NEULASTA) [place] - - - - - - -  prochlorperazine (COMPAZINE) IV - - - - 10 mg - -    INTERVAL HISTORY:  54 year old lady who has a 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 Persistent nausea vomiting Continuing headache and neck pain Patient started having diplopia over last 48 hours Facial drooping Patient was in emergency room with CAT scan without contrast did not reveal any evidence of metastases patient is here for further follow-up Continues to have weakness in lower extremity Frequent fall MRI scan has been independently reviewed shows leptomeningeal thickening.  With cerebral edema.      REVIEW OF SYSTEMS:   GENERAL:  Patient is feeling weak and tired.  Frequent fall Low-grade fever.  Aches and pains PERFORMANCE STATUS (ECOG):   HEENT:  No visual changes, runny nose, sore throat, mouth sores or tenderness. Lungs: No shortness of breath or cough.  No hemoptysis. Cardiac:  No chest pain, palpitations, orthopnea, or PND. GI: intermittent  Nausea and vomiting. He should not complaints of nausea and vomiting for last 2 days.  Epigastric discomfort.  Taking Nexium once a day GU:  No urgency, frequency, dysuria, or hematuria.June 09, 2015 Musculoskeletal:  No back pain.  No joint pain.  No muscle tenderness. Extremities:  No pain or swelling. Skin:  No rashes or skin changes. Neuro:  No headache, numbness or weakness, balance or coordination issues.  Endocrine:  No diabetes, thyroid issues, hot flashes or night sweats. Psych:  No mood changes, depression or anxiety. Pain: Neck pain Review of systems:  All other systems reviewed and found to be negative. Epigastric discomfort which is persistent dull aching As per HPI. Otherwise, a complete review of systems is negatve.  PAST MEDICAL HISTORY: Past Medical History  Diagnosis Date  . Arthritis   . Diabetes mellitus without complication ([place])   . Colon polyps   . GERD (gastroesophageal reflux disease)   . Cancer ([place]) 2002     LEFT MASTECTOMY  . Breast cancer, female  ([place]) 1997  . Shortness of breath dyspnea   . Headache     PAST SURGICAL HISTORY: Past Surgical History  Procedure Laterality Date  . Mastectomy Left 2002  . Colonoscopy  [id number]    DR.BYRNETT  . Foot surgery Right   . Gastric endoscopy   2002  . Bone marrow transplant    . Cholecystectomy  1995  . Sigmoidoscopy  2013    FAMILY HISTORY Family History  Problem Relation Age of Onset  . Colon cancer Sister   . COPD Mother       ADVANCED DIRECTIVES:  Patient does   Not have advanced health care directive Patient does not have any advanced healthcare directive. Information has been given.     HEALTH MAINTENANCE: Social History  Substance Use Topics  . Smoking status: Never Smoker   . Smokeless tobacco: Never Used  . Alcohol Use: No      Allergies  Allergen Reactions  . Codeine Hives and Nausea Only    Other Reaction: GI Upset  . Latex Itching and Hives  . Shellfish Allergy Swelling    Lips    Current Outpatient Prescriptions  Medication Sig Dispense Refill  . Ascorbic Acid (VITAMIN C) 1000 MG tablet Take 1,000 mg by mouth daily.    . Calcium Carb-Cholecalciferol 600-400 MG-UNIT TABS Take by mouth.    . calcium carbonate (OS-CAL - DOSED IN MG OF ELEMENTAL CALCIUM) 1250 MG tablet Take 1 tablet by mouth daily.    . ciprofloxacin (CIPRO) 500 MG tablet Take 1 tablet (500 mg total) by mouth 2 (two) times daily. 14 tablet 0  . esomeprazole (NEXIUM) 40 MG capsule Take 40 mg by mouth 2 (two) times daily before a meal.     . fentaNYL (DURAGESIC - DOSED MCG/HR) 25 MCG/HR patch Place 1 patch (25 mcg total) onto the skin every 3 (three) days. 10 patch 0  . ferrous sulfate 325 (65 FE) MG tablet Take by mouth.    . furosemide (LASIX) 20 MG tablet TAKE 1 TABLET EVERY DAY 30 tablet 4  . glipiZIDE (GLUCOTROL) 5 MG tablet   0  . KLOR-CON 10 10 MEQ tablet TAKE 1 TABLET (10 MEQ TOTAL) BY MOUTH 2 (TWO) TIMES DAILY. 60 tablet 3  . lidocaine-prilocaine (EMLA) cream   3  .  loratadine (CLARITIN) 10 MG tablet Take 10 mg by mouth daily.    Marland Kitchen LORazepam (ATIVAN) 0.5 MG tablet Take 1 tablet (0.5 mg total) by mouth 3 (three) times daily as needed for anxiety. 90 tablet 3  . meloxicam (MOBIC) 15 MG tablet Take 15 mg by mouth daily.    . ondansetron (ZOFRAN) 4 MG tablet TAKE 1 TABLET (4 MG TOTAL) BY MOUTH EVERY 6 (SIX) HOURS AS NEEDED FOR NAUSEA OR VOMITING. 30 tablet 3  . oxyCODONE-acetaminophen (PERCOCET/ROXICET) 5-325 MG tablet Take 1 tablet by mouth every 4 (four) hours as  needed for severe pain. 60 tablet 0  . PARoxetine (PAXIL) 40 MG tablet TAKE 1 TABLET ONCE DAILY 30 tablet 3  . polyethylene glycol powder (GLYCOLAX/MIRALAX) powder 255 grams one bottle for colonoscopy prep 255 g 0  . promethazine (PHENERGAN) 25 MG suppository Place 1 suppository (25 mg total) rectally every 6 (six) hours as needed for nausea. 12 suppository 1  . spironolactone (ALDACTONE) 100 MG tablet Take 1 tablet (100 mg total) by mouth daily. 30 tablet 6  . sulfamethoxazole-trimethoprim (BACTRIM DS,SEPTRA DS) 800-160 MG per tablet Take 1 tablet by mouth 2 (two) times daily. (Patient not taking: Reported on 07/13/2015) 4 tablet 0  . vitamin E 400 UNIT capsule Take 400 Units by mouth daily.     No current facility-administered medications for this visit.   Facility-Administered Medications Ordered in Other Visits  Medication Dose Route Frequency Provider Last Rate Last Dose  . sodium chloride 0.9 % injection 10 mL  10 mL Intracatheter PRN Forest Gleason, MD   10 mL at 02/18/15 1453    OBJECTIVE:  There were no vitals filed for this visit.   There is no weight on file to calculate BMI.    ECOG FS:1 - Symptomatic but completely ambulatory  PHYSICAL EXAM: GENERAL:  Well developed, well nourished, sitting comfortably in the exam room in no acute distress. MENTAL STATUS:  Alert and oriented to person, place and time. HEAD:alopecia i.  Normocephalic, atraumatic, face symmetric, no Cushingoid  features. EYES:  Pupils equal round and reactive to light and accomodation.  No conjunctivitis or scleral icterus. ENT:  Oropharynx clear without lesion.  Tongue normal. Mucous membranes moist.  RESPIRATORY:  Clear to auscultation without rales, wheezes or rhonchi. CARDIOVASCULAR:  Regular rate and rhythm without murmur, rub or gallop. Abdomen: Tenderness in epigastric area.  Liver is not palpable.  No ascites. Oh palpable mass BACK:  No CVA tenderness.  No tenderness on percussion of the back or rib cage. SKIN:  No rashes, ulcers or lesions. EXTREMITIES: No edema, no skin discoloration or tenderness.  No palpable cords. LYMPH NODES: No palpable cervical, supraclavicular, axillary or inguinal adenopathy  NEUROLOGICAL: Unremarkable. PSYCH:  Appropriate. Neurological system: 6 nerve  palsy on the right side.  Possibility of right 7 nerve  Palsy Weakness in both lower extremity.  Complete examination is difficult.                    LAB RESULTS:  Office Visit on 07/14/2015  Component Date Value Ref Range Status  . Tube # 07/14/2015 1   Final  . Color, CSF 07/14/2015 COLORLESS  COLORLESS Final  . Appearance, CSF 07/14/2015 CLEAR* CLEAR Final  . Supernatant 07/14/2015 CLEAR   Final  . RBC Count, CSF 07/14/2015 0  0 - 3 /cu mm Final  . WBC, CSF 07/14/2015 56   Final  . Segmented Neutrophils-CSF 07/14/2015 0   Final  . Lymphs, CSF 07/14/2015 79   Final  . Monocyte-Macrophage-Spinal Fluid 07/14/2015 21   Final  . Eosinophils, CSF 07/14/2015 0   Final  . Other Cells, CSF 07/14/2015 0   Final  Hospital Outpatient Visit on 07/14/2015  Component Date Value Ref Range Status  . Prothrombin Time 07/14/2015 14.3  11.4 - 15.0 seconds Final  . INR 07/14/2015 1.09   Final  . aPTT 07/14/2015 45* 24 - 36 seconds Final   Comment:        IF BASELINE aPTT IS ELEVATED, SUGGEST PATIENT RISK ASSESSMENT BE USED TO DETERMINE  APPROPRIATE ANTICOAGULANT THERAPY.   . Glucose, CSF 07/14/2015 43  40 - 70  mg/dL Final  . Total  Protein, CSF 07/14/2015 477* 15 - 45 mg/dL Final   RESULTS CONFIRMED BY MANUAL DILUTION  . Tube # 07/14/2015 3   Final  . Color, CSF 07/14/2015 COLORLESS  COLORLESS Final  . Appearance, CSF 07/14/2015 CLEAR* CLEAR Final  . Supernatant 07/14/2015 CLEAR   Final  . RBC Count, CSF 07/14/2015 0  0 - 3 /cu mm Final  . WBC, CSF 07/14/2015 58   Final  . Segmented Neutrophils-CSF 07/14/2015 0   Final  . Lymphs, CSF 07/14/2015 66   Final  . Monocyte-Macrophage-Spinal Fluid 07/14/2015 34   Final  . Eosinophils, CSF 07/14/2015 0   Final  . Other Cells, CSF 07/14/2015 0   Final  . Path Review 07/14/2015 CSF with metastatic carcinoma.   Final   Comment: Result communicated to Dr. Oliva Bustard on 07/14/2015 at 4:45 PM. Reviewed by Dellia Nims. Reuel Derby, M.D.     Lab Results  Component Value Date   LABCA2 28.6 06/09/2015   IMPRESSION: 1. Similar appearance of the liver. 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 The posterior right hepatic lobe dominant lesion is unchanged in size and may represent a hemangioma, given peripheral nodular enhancement. Portal venous hypertension, including periesophageal ascites. 2. No extrahepatic metastatic disease identified. 3. Possible constipation. No explanation for vomiting.   ASSESSMENT: MRI scan has been reviewed independently is consistent with leptomeningeal disease.  We will proceed with IV steroid because of her diabetic condition would lead to be careful giving steroids.  I will also discuss situation with neurosurgeon at [facility]  Dr Talbot Grumbling is supposed to see patient on Thursday for possibility of [place]. All the records are being faxed to him.  I discussed this findings with the patient and family  CSF findings have been reviewed with Dr. Truddie Coco asked pathologist.  I do not see any need for any further's workup as we are going to get some more  fluid obtained through the [place]. Total duration of visit was30  minutes.  50% or more time was spent in counseling patient and family regarding prognosis and options of treatment and available resources  Patient has mobility issues which render her unable to ambulate at all. She also requires frequent repositioning of her body that cannot be achieved in a standard bed.         Breast cancer metastasized to liver   Staging form: Breast, AJCC 7th Edition     Clinical: Stage IV (T2, N3, M1) - Signed by Forest Gleason, MD on 02/18/2015   Forest Gleason, MD   07/18/2015 9:50 AM

## 2015-07-20 ENCOUNTER — Telehealth: Payer: Self-pay | Admitting: *Deleted

## 2015-07-20 NOTE — Telephone Encounter (Signed)
[facility] called back to say he had spoken with Dr. Joanie Coddington office @ [facility].  They told him they could not make appointment until they had paperwork from our office.    Informed patient that paperwork has been faxed to their office twice.  We will re-fax paperwork.

## 2015-07-20 NOTE — Telephone Encounter (Signed)
Per records from [facility], Dr. Minna Merritts office has trying to get in contact to inform of appt. Will arrange for mri to be put on disc for pt to pick up at cancer center and take with them to appt. Pt's spouse needs to call Dr. Minna Merritts office to get appt info.

## 2015-07-20 NOTE — Telephone Encounter (Signed)
Called patient and left message for Paula [facility] that [facility] has tried to get in touch with them regarding patient's appointment.  He should call them back.  Left phone number [phone].  Also informed him that they are requesting him to bring MRI with him.  Once he has the appointment he should come by the [facility] and pick up the MRI disc that will be left for him at registration.  Advised him to call if he has questions.

## 2015-07-20 NOTE — Telephone Encounter (Signed)
Husband of patient wants to know when they are supposed to see Dr. Lenna Sciara the neurosurgeon at [facility] for [facility] placement.

## 2015-07-21 ENCOUNTER — Telehealth: Payer: Self-pay | Admitting: *Deleted

## 2015-07-21 MED ORDER — PROMETHAZINE HCL 25 MG RE SUPP: 25.0000 mg | Freq: Four times a day (QID) | RECTAL | Status: DC | PRN

## 2015-07-21 MED ORDER — PROMETHAZINE HCL 25 MG RE SUPP: 25.0000 mg | Freq: Four times a day (QID) | RECTAL | Status: AC | PRN

## 2015-07-21 MED ORDER — ONDANSETRON HCL 4 MG PO TABS: ORAL_TABLET | ORAL | Status: DC

## 2015-07-21 NOTE — Telephone Encounter (Signed)
Continues to have vomiting and needs refill on Ondansetron and Promethazine supp.  E Scribed

## 2015-07-21 NOTE — Telephone Encounter (Signed)
[place] to see if pt has an appt arranged to see Dr. Talbot Grumbling. Per [facility], had an appt arranged for Thursday 10/6 and Monday 10/10 but family did not return call back.   Called [place], pt's spouse, and informed him that he needs to contact [facility] to arrange appt. All records have been received. Gave phone# [phone], opt 4. Instructed Everlean Patterson that needs to come by cancer center prior to appt to pick up disc to take with them to see Dr. Talbot Grumbling. J[place]y verbalized understanding.

## 2015-07-23 ENCOUNTER — Telehealth: Payer: Self-pay | Admitting: *Deleted

## 2015-07-23 NOTE — Telephone Encounter (Signed)
Called patient's husband to find out if they have made appointment with Dr. Lenna Sciara @ [facility].  He stated they have an appointment for 07-29-15.  He came by to pick up the MRI this morning to take with them to appointment.

## 2015-07-24 ENCOUNTER — Telehealth: Payer: Self-pay | Admitting: *Deleted

## 2015-07-24 NOTE — Telephone Encounter (Signed)
Order for hospital bed, wheelchair, and patient lift faxed to [facility].

## 2015-07-24 NOTE — Telephone Encounter (Signed)
Needs a hospital bed ordered

## 2015-07-28 ENCOUNTER — Telehealth: Payer: Self-pay | Admitting: *Deleted

## 2015-07-28 DIAGNOSIS — C50919 Malignant neoplasm of unspecified site of unspecified female breast: Secondary | ICD-10-CM

## 2015-07-28 DIAGNOSIS — C787 Secondary malignant neoplasm of liver and intrahepatic bile duct: Principal | ICD-10-CM

## 2015-07-28 MED ORDER — OXYCODONE-ACETAMINOPHEN 5-325 MG PO TABS: 1.0000 | ORAL_TABLET | ORAL | Status: DC | PRN

## 2015-07-28 NOTE — Telephone Encounter (Signed)
Informed that prescription is ready to pick up  

## 2015-07-30 ENCOUNTER — Telehealth: Payer: Self-pay | Admitting: *Deleted

## 2015-07-30 NOTE — Telephone Encounter (Signed)
Patient enroute from [facility].  Can MD sign paperwork for patient to have hospital bed delivered.  Also needs form for transportation to and from MD appts.  Need to send to [facility].  [place] care and asked them to fax paperwork to [place] that needs to be signed.  Spoke with Bryson Ha who will be faxing them to fax # [phone].

## 2015-07-31 ENCOUNTER — Telehealth: Payer: Self-pay | Admitting: *Deleted

## 2015-07-31 NOTE — Telephone Encounter (Signed)
Lovey Newcomer called to request that order for [facility] lift be faxed to [facility] care since it was not included in the original order. Order for [facility] lift to include slings has been faxed to [facility] at [phone].

## 2015-08-03 ENCOUNTER — Inpatient Hospital Stay: Payer: Managed Care, Other (non HMO)

## 2015-08-03 ENCOUNTER — Ambulatory Visit: Payer: Managed Care, Other (non HMO)

## 2015-08-03 ENCOUNTER — Inpatient Hospital Stay (HOSPITAL_BASED_OUTPATIENT_CLINIC_OR_DEPARTMENT_OTHER): Payer: Managed Care, Other (non HMO) | Admitting: Oncology

## 2015-08-03 VITALS — BP 124/91 | HR 104 | Temp 96.3°F | Wt 190.0 lb

## 2015-08-03 DIAGNOSIS — C787 Secondary malignant neoplasm of liver and intrahepatic bile duct: Principal | ICD-10-CM

## 2015-08-03 DIAGNOSIS — Z9012 Acquired absence of left breast and nipple: Secondary | ICD-10-CM

## 2015-08-03 DIAGNOSIS — H532 Diplopia: Secondary | ICD-10-CM

## 2015-08-03 DIAGNOSIS — K766 Portal hypertension: Secondary | ICD-10-CM

## 2015-08-03 DIAGNOSIS — Z9221 Personal history of antineoplastic chemotherapy: Secondary | ICD-10-CM

## 2015-08-03 DIAGNOSIS — C7932 Secondary malignant neoplasm of cerebral meninges: Secondary | ICD-10-CM | POA: Diagnosis not present

## 2015-08-03 DIAGNOSIS — C50919 Malignant neoplasm of unspecified site of unspecified female breast: Secondary | ICD-10-CM

## 2015-08-03 DIAGNOSIS — G936 Cerebral edema: Secondary | ICD-10-CM

## 2015-08-03 DIAGNOSIS — Z86010 Personal history of colonic polyps: Secondary | ICD-10-CM

## 2015-08-03 DIAGNOSIS — C7951 Secondary malignant neoplasm of bone: Secondary | ICD-10-CM

## 2015-08-03 DIAGNOSIS — C7800 Secondary malignant neoplasm of unspecified lung: Secondary | ICD-10-CM | POA: Diagnosis not present

## 2015-08-03 DIAGNOSIS — Z170 Estrogen receptor positive status [ER+]: Secondary | ICD-10-CM | POA: Diagnosis not present

## 2015-08-03 DIAGNOSIS — R29810 Facial weakness: Secondary | ICD-10-CM

## 2015-08-03 DIAGNOSIS — K219 Gastro-esophageal reflux disease without esophagitis: Secondary | ICD-10-CM

## 2015-08-03 DIAGNOSIS — G038 Meningitis due to other specified causes: Secondary | ICD-10-CM

## 2015-08-03 DIAGNOSIS — C770 Secondary and unspecified malignant neoplasm of lymph nodes of head, face and neck: Secondary | ICD-10-CM

## 2015-08-03 DIAGNOSIS — R531 Weakness: Secondary | ICD-10-CM

## 2015-08-03 DIAGNOSIS — E119 Type 2 diabetes mellitus without complications: Secondary | ICD-10-CM

## 2015-08-03 DIAGNOSIS — C50912 Malignant neoplasm of unspecified site of left female breast: Secondary | ICD-10-CM

## 2015-08-03 DIAGNOSIS — Z923 Personal history of irradiation: Secondary | ICD-10-CM

## 2015-08-03 DIAGNOSIS — R509 Fever, unspecified: Secondary | ICD-10-CM

## 2015-08-03 DIAGNOSIS — M542 Cervicalgia: Secondary | ICD-10-CM

## 2015-08-03 DIAGNOSIS — R112 Nausea with vomiting, unspecified: Secondary | ICD-10-CM

## 2015-08-03 DIAGNOSIS — Z79899 Other long term (current) drug therapy: Secondary | ICD-10-CM

## 2015-08-03 DIAGNOSIS — Z9181 History of falling: Secondary | ICD-10-CM

## 2015-08-03 DIAGNOSIS — R5383 Other fatigue: Secondary | ICD-10-CM

## 2015-08-03 DIAGNOSIS — R188 Other ascites: Secondary | ICD-10-CM

## 2015-08-03 DIAGNOSIS — M129 Arthropathy, unspecified: Secondary | ICD-10-CM

## 2015-08-03 DIAGNOSIS — R51 Headache: Secondary | ICD-10-CM

## 2015-08-03 LAB — COMPREHENSIVE METABOLIC PANEL
ALT: 23 U/L (ref 14–54)
ANION GAP: 9 (ref 5–15)
AST: 31 U/L (ref 15–41)
Albumin: 3.8 g/dL (ref 3.5–5.0)
Alkaline Phosphatase: 126 U/L (ref 38–126)
BUN: 11 mg/dL (ref 6–20)
CHLORIDE: 96 mmol/L — AB (ref 101–111)
CO2: 31 mmol/L (ref 22–32)
Calcium: 9.2 mg/dL (ref 8.9–10.3)
Creatinine, Ser: 0.67 mg/dL (ref 0.44–1.00)
Glucose, Bld: 105 mg/dL — ABNORMAL HIGH (ref 65–99)
Potassium: 3.6 mmol/L (ref 3.5–5.1)
SODIUM: 136 mmol/L (ref 135–145)
Total Bilirubin: 1.3 mg/dL — ABNORMAL HIGH (ref 0.3–1.2)
Total Protein: 6.6 g/dL (ref 6.5–8.1)

## 2015-08-03 LAB — CBC WITH DIFFERENTIAL/PLATELET
Basophils Absolute: 0.1 10*3/uL (ref 0–0.1)
Basophils Relative: 1 %
EOS ABS: 0.2 10*3/uL (ref 0–0.7)
EOS PCT: 1 %
HCT: 44 % (ref 35.0–47.0)
Hemoglobin: 15.1 g/dL (ref 12.0–16.0)
LYMPHS ABS: 1.7 10*3/uL (ref 1.0–3.6)
Lymphocytes Relative: 14 %
MCH: 29 pg (ref 26.0–34.0)
MCHC: 34.3 g/dL (ref 32.0–36.0)
MCV: 84.6 fL (ref 80.0–100.0)
MONO ABS: 0.5 10*3/uL (ref 0.2–0.9)
MONOS PCT: 4 %
NEUTROS PCT: 80 %
Neutro Abs: 9.6 10*3/uL — ABNORMAL HIGH (ref 1.4–6.5)
PLATELETS: 89 10*3/uL — AB (ref 150–440)
RBC: 5.19 MIL/uL (ref 3.80–5.20)
RDW: 18 % — AB (ref 11.5–14.5)
WBC: 12 10*3/uL — AB (ref 3.6–11.0)

## 2015-08-03 MED ORDER — HEPARIN SOD (PORK) LOCK FLUSH 100 UNIT/ML IV SOLN
500.0000 [IU] | Freq: Once | INTRAVENOUS | Status: AC
Start: 2015-08-03 — End: 2015-08-03
  Administered 2015-08-03: 500 [IU] via INTRAVENOUS

## 2015-08-03 MED ORDER — PREDNISONE 20 MG PO TABS: 20.0000 mg | ORAL_TABLET | Freq: Every day | ORAL | Status: DC

## 2015-08-03 MED ORDER — FENTANYL 25 MCG/HR TD PT72: 25.0000 ug | MEDICATED_PATCH | TRANSDERMAL | Status: DC

## 2015-08-03 MED ORDER — LACTULOSE 10 GM/15ML PO SOLN: 10.0000 g | Freq: Two times a day (BID) | ORAL | Status: AC | PRN

## 2015-08-03 MED ORDER — SODIUM CHLORIDE 0.9 % IV SOLN
Freq: Once | INTRAVENOUS | Status: AC
  Administered 2015-08-03: 14:00:00 via INTRAVENOUS
  Filled 2015-08-03: qty 4

## 2015-08-03 MED ORDER — OXYCODONE-ACETAMINOPHEN 10-325 MG PO TABS: 1.0000 | ORAL_TABLET | ORAL | Status: DC | PRN

## 2015-08-03 MED ORDER — SODIUM CHLORIDE 0.9 % IJ SOLN
10.0000 mL | Freq: Once | INTRAMUSCULAR | Status: AC
  Administered 2015-08-03: 10 mL via INTRAVENOUS
  Filled 2015-08-03: qty 10

## 2015-08-03 MED ORDER — SODIUM CHLORIDE 0.9 % IJ SOLN
Freq: Once | INTRAMUSCULAR | Status: AC
  Administered 2015-08-03: 15:00:00 via INTRATHECAL
  Filled 2015-08-03: qty 0.48

## 2015-08-03 NOTE — Progress Notes (Signed)
Pt and family requesting something for constipation, appetite stimulant and pain meds.

## 2015-08-04 LAB — CANCER ANTIGEN 27.29: CA 27.29: 26 U/mL (ref 0.0–38.6)

## 2015-08-05 ENCOUNTER — Other Ambulatory Visit: Payer: Self-pay | Admitting: Family Medicine

## 2015-08-08 ENCOUNTER — Other Ambulatory Visit: Payer: Self-pay | Admitting: Family Medicine

## 2015-08-10 ENCOUNTER — Inpatient Hospital Stay: Payer: Managed Care, Other (non HMO)

## 2015-08-10 ENCOUNTER — Encounter: Payer: Self-pay | Admitting: Oncology

## 2015-08-10 ENCOUNTER — Inpatient Hospital Stay (HOSPITAL_BASED_OUTPATIENT_CLINIC_OR_DEPARTMENT_OTHER): Payer: Managed Care, Other (non HMO) | Admitting: Oncology

## 2015-08-10 VITALS — Temp 96.7°F | Wt 179.0 lb

## 2015-08-10 DIAGNOSIS — R531 Weakness: Secondary | ICD-10-CM

## 2015-08-10 DIAGNOSIS — C50912 Malignant neoplasm of unspecified site of left female breast: Secondary | ICD-10-CM

## 2015-08-10 DIAGNOSIS — Z9221 Personal history of antineoplastic chemotherapy: Secondary | ICD-10-CM

## 2015-08-10 DIAGNOSIS — C770 Secondary and unspecified malignant neoplasm of lymph nodes of head, face and neck: Secondary | ICD-10-CM

## 2015-08-10 DIAGNOSIS — Z79811 Long term (current) use of aromatase inhibitors: Secondary | ICD-10-CM | POA: Diagnosis not present

## 2015-08-10 DIAGNOSIS — H532 Diplopia: Secondary | ICD-10-CM

## 2015-08-10 DIAGNOSIS — C7800 Secondary malignant neoplasm of unspecified lung: Secondary | ICD-10-CM

## 2015-08-10 DIAGNOSIS — Z9012 Acquired absence of left breast and nipple: Secondary | ICD-10-CM

## 2015-08-10 DIAGNOSIS — R112 Nausea with vomiting, unspecified: Secondary | ICD-10-CM

## 2015-08-10 DIAGNOSIS — K766 Portal hypertension: Secondary | ICD-10-CM

## 2015-08-10 DIAGNOSIS — K219 Gastro-esophageal reflux disease without esophagitis: Secondary | ICD-10-CM

## 2015-08-10 DIAGNOSIS — C7932 Secondary malignant neoplasm of cerebral meninges: Secondary | ICD-10-CM

## 2015-08-10 DIAGNOSIS — Z170 Estrogen receptor positive status [ER+]: Secondary | ICD-10-CM

## 2015-08-10 DIAGNOSIS — Z923 Personal history of irradiation: Secondary | ICD-10-CM

## 2015-08-10 DIAGNOSIS — M542 Cervicalgia: Secondary | ICD-10-CM

## 2015-08-10 DIAGNOSIS — R29810 Facial weakness: Secondary | ICD-10-CM

## 2015-08-10 DIAGNOSIS — R5383 Other fatigue: Secondary | ICD-10-CM

## 2015-08-10 DIAGNOSIS — C787 Secondary malignant neoplasm of liver and intrahepatic bile duct: Principal | ICD-10-CM

## 2015-08-10 DIAGNOSIS — M129 Arthropathy, unspecified: Secondary | ICD-10-CM

## 2015-08-10 DIAGNOSIS — C799 Secondary malignant neoplasm of unspecified site: Secondary | ICD-10-CM

## 2015-08-10 DIAGNOSIS — R509 Fever, unspecified: Secondary | ICD-10-CM

## 2015-08-10 DIAGNOSIS — R51 Headache: Secondary | ICD-10-CM

## 2015-08-10 DIAGNOSIS — Z86010 Personal history of colonic polyps: Secondary | ICD-10-CM

## 2015-08-10 DIAGNOSIS — R188 Other ascites: Secondary | ICD-10-CM

## 2015-08-10 DIAGNOSIS — E119 Type 2 diabetes mellitus without complications: Secondary | ICD-10-CM

## 2015-08-10 DIAGNOSIS — G936 Cerebral edema: Secondary | ICD-10-CM

## 2015-08-10 DIAGNOSIS — C50919 Malignant neoplasm of unspecified site of unspecified female breast: Secondary | ICD-10-CM

## 2015-08-10 DIAGNOSIS — Z9181 History of falling: Secondary | ICD-10-CM

## 2015-08-10 DIAGNOSIS — Z79899 Other long term (current) drug therapy: Secondary | ICD-10-CM

## 2015-08-10 DIAGNOSIS — C7951 Secondary malignant neoplasm of bone: Secondary | ICD-10-CM

## 2015-08-10 LAB — COMPREHENSIVE METABOLIC PANEL
ALK PHOS: 141 U/L — AB (ref 38–126)
ALT: 87 U/L — ABNORMAL HIGH (ref 14–54)
AST: 70 U/L — ABNORMAL HIGH (ref 15–41)
Albumin: 3.8 g/dL (ref 3.5–5.0)
Anion gap: 10 (ref 5–15)
BILIRUBIN TOTAL: 2.5 mg/dL — AB (ref 0.3–1.2)
BUN: 17 mg/dL (ref 6–20)
CALCIUM: 9.2 mg/dL (ref 8.9–10.3)
CO2: 29 mmol/L (ref 22–32)
CREATININE: 0.68 mg/dL (ref 0.44–1.00)
Chloride: 93 mmol/L — ABNORMAL LOW (ref 101–111)
GFR calc Af Amer: >60 mL/min (ref >60)
GLUCOSE: 152 mg/dL — AB (ref 65–99)
POTASSIUM: 3.8 mmol/L (ref 3.5–5.1)
SODIUM: 132 mmol/L — AB (ref 135–145)
Total Protein: 6.8 g/dL (ref 6.5–8.1)

## 2015-08-10 LAB — CBC WITH DIFFERENTIAL/PLATELET
Basophils Absolute: 0 10*3/uL (ref 0–0.1)
Basophils Relative: 0 %
EOS ABS: 0 10*3/uL (ref 0–0.7)
EOS PCT: 0 %
HCT: 46.2 % (ref 35.0–47.0)
HEMOGLOBIN: 15.7 g/dL (ref 12.0–16.0)
LYMPHS ABS: 1.3 10*3/uL (ref 1.0–3.6)
Lymphocytes Relative: 12 %
MCH: 28.8 pg (ref 26.0–34.0)
MCHC: 34.1 g/dL (ref 32.0–36.0)
MCV: 84.4 fL (ref 80.0–100.0)
MONO ABS: 0.3 10*3/uL (ref 0.2–0.9)
MONOS PCT: 3 %
NEUTROS PCT: 85 %
Neutro Abs: 9.3 10*3/uL — ABNORMAL HIGH (ref 1.4–6.5)
Platelets: 83 10*3/uL — ABNORMAL LOW (ref 150–440)
RBC: 5.47 MIL/uL — ABNORMAL HIGH (ref 3.80–5.20)
RDW: 17.9 % — AB (ref 11.5–14.5)
WBC: 11 10*3/uL (ref 3.6–11.0)

## 2015-08-10 LAB — PROTEIN, CSF: TOTAL PROTEIN, CSF: 29 mg/dL (ref 15–45)

## 2015-08-10 LAB — CSF CELL COUNT WITH DIFFERENTIAL
EOS CSF: 0 %
Lymphs, CSF: 70 %
Monocyte-Macrophage-Spinal Fluid: 20 %
OTHER CELLS CSF: 0
RBC Count, CSF: 343 /mm3 — ABNORMAL HIGH (ref 0–3)
SEGMENTED NEUTROPHILS-CSF: 10 %
WBC, CSF: 8 /mm3

## 2015-08-10 MED ORDER — SODIUM CHLORIDE 0.9 % IJ SOLN
Freq: Once | INTRAMUSCULAR | Status: AC
  Administered 2015-08-10: 16:00:00 via INTRATHECAL
  Filled 2015-08-10: qty 0.48

## 2015-08-10 MED ORDER — HEPARIN SOD (PORK) LOCK FLUSH 100 UNIT/ML IV SOLN
500.0000 [IU] | Freq: Once | INTRAVENOUS | Status: AC
  Administered 2015-08-10: 500 [IU] via INTRAVENOUS

## 2015-08-10 MED ORDER — SODIUM CHLORIDE 0.9 % IV SOLN
Freq: Once | INTRAVENOUS | Status: AC
  Administered 2015-08-10: 14:00:00 via INTRAVENOUS
  Filled 2015-08-10: qty 4

## 2015-08-10 MED ORDER — SODIUM CHLORIDE 0.9 % IJ SOLN
10.0000 mL | INTRAMUSCULAR | Status: AC | PRN
  Administered 2015-08-10: 10 mL
  Filled 2015-08-10: qty 10

## 2015-08-10 NOTE — Progress Notes (Signed)
[facility] [phone]  [phone] [place] OB: 02-16-1961  MR#: [id number]  OVF#:[id number]  Patient Care Team: Pcp Not In System as PCP - General Robert Bellow, MD (General Surgery)  CHIEF COMPLAINT:  Chief Complaint  Patient presents with  . OTHER    intrathecal infusion    Chief Complaint/Problem List  1.  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 Status post Cytoxan, Adriamycin, followed by high dose chemotherapy and stem cell support.  Status post radiation therapy. 1996 2.  Was on Tamoxifen therapy.  3.  Progressive disease with the left supraclavicular lymph node positive on needle aspiration.  4.  Tamoxifen has been discontinued.  5.  Ovarian ablation with Lupron, Femara and radiation to the ovaries starting in December 2003. 6.  On Femara  7.  Rising tumor markers.  CT scan is positive for liver metastases.  And lung metastasesBiopsies positive for metastatic disease (liver biopsy) consistent with breast primary. November, 2014 [place] and progesterone receptor positive.  HER-2 receptor negative.. 8.  Abraxene starting  from September 03, 2013.  9.Patient was taken off Abraxane because of progressive side effect and started on ERIBULIN April 08, 2014    10.  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, 2016) Patient started on intrathecal methotrexate October 2 016    Oncology Flowsheet 05/12/2015 05/26/2015 06/23/2015 06/26/2015 07/13/2015 07/14/2015 08/03/2015  Day, Cycle Day 1, Cycle 4 Day 8, Cycle 4 - - - - Day 1, Cycle 1  dexamethasone (DECADRON) IV [ 10 mg ] [ 10 mg ] - - [ 8 mg ] - [ 10 mg ]  eriBULin mesylate (HALAVEN) IV 2.35 mg 1.1 mg/m2 - - - - -  LORazepam (ATIVAN) IV - - 0.5 mg - - - -  LORazepam (ATIVAN) PO - - - - - 1 mg -  methotrexate (PF) IT - - - - - - [ 12 mg ]  ondansetron (ZOFRAN) IV [ 8 mg ] [ 8 mg ] [ 8 mg  ] - [ 8 mg ] - [ 8 mg ]  pegfilgrastim (NEULASTA ONPRO KIT) [place] - - - - - - -  pegfilgrastim (NEULASTA) [place] - - - - - - -  prochlorperazine (COMPAZINE) IV - - - 10 mg - - -    INTERVAL HISTORY: 54 year old lady who has a 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 Persistent nausea vomiting Patient's condition has been rapidly declining.  Patient did have  omaya rervoir placement at [facility].  Patient continues to nausea vomiting.  Difficulty in ambulating. Patient has been prescribed a hospital bed because of difficulty getting up and down Frequent fall Here for further follow-up and possibility of intrathecal chemotherapy administration      REVIEW OF SYSTEMS:   GENERAL:  Patient is feeling weak and tired.  Frequent fall Low-grade fever.  Aches and pains PERFORMANCE STATUS (ECOG):   HEENT:  No visual changes, runny nose, sore throat, mouth sores or tenderness. Lungs: No shortness of breath or cough.  No hemoptysis. Cardiac:  No chest pain, palpitations, orthopnea, or PND. GI: intermittent  Nausea and vomiting. He should not complaints of nausea and vomiting for last 2 days.  Epigastric discomfort.  Taking Nexium once a day GU:  No urgency, frequency, dysuria, or hematuria.June 09, 2015 Musculoskeletal:  No back  pain.  No joint pain.  No muscle tenderness. Extremities:  No pain or swelling. Skin:  No rashes or skin changes. Neuro:  No headache, numbness or weakness, balance or coordination issues. Endocrine:  No diabetes, thyroid issues, hot flashes or night sweats. Psych:  No mood changes, depression or anxiety. Pain: Neck pain Review of systems:  All other systems reviewed and found to be negative. Epigastric discomfort which is persistent dull aching As per HPI. Otherwise, a complete review of systems is negatve.  PAST MEDICAL HISTORY: Past Medical History  Diagnosis Date  . Arthritis   . Diabetes mellitus  without complication (HCC)   . Colon polyps   . GERD (gastroesophageal reflux disease)   . Cancer (HCC) 2002     LEFT MASTECTOMY  . Breast cancer, female (HCC) 1997  . Shortness of breath dyspnea   . Headache     PAST SURGICAL HISTORY: Past Surgical History  Procedure Laterality Date  . Mastectomy Left 2002  . Colonoscopy  2011,2014    DR.[email or web address]  . Foot surgery Right   . Gastric endoscopy   2002  . Bone marrow transplant    . Cholecystectomy  1995  . Sigmoidoscopy  2013    FAMILY HISTORY Family History  Problem Relation Age of Onset  . Colon cancer Sister   . COPD Mother       ADVANCED DIRECTIVES:  Patient does   Not have advanced health care directive Patient does not have any advanced healthcare directive. Information has been given.     HEALTH MAINTENANCE: Social History  Substance Use Topics  . Smoking status: Never Smoker   . Smokeless tobacco: Never Used  . Alcohol Use: No      Allergies  Allergen Reactions  . Codeine Hives and Nausea Only    Other Reaction: GI Upset  . Latex Itching and Hives  . Shellfish Allergy Swelling    Lips    Current Outpatient Prescriptions  Medication Sig Dispense Refill  . Ascorbic Acid (VITAMIN C) 1000 MG tablet Take 1,000 mg by mouth daily.    . Calcium Carb-Cholecalciferol 600-400 MG-UNIT TABS Take by mouth.    . calcium carbonate (OS-CAL - DOSED IN MG OF ELEMENTAL CALCIUM) 1250 MG tablet Take 1 tablet by mouth daily.    . esomeprazole (NEXIUM) 40 MG capsule Take 40 mg by mouth 2 (two) times daily before a meal.     . ferrous sulfate 325 (65 FE) MG tablet Take by mouth.    . furosemide (LASIX) 20 MG tablet TAKE 1 TABLET EVERY DAY 30 tablet 4  . KLOR-CON 10 10 MEQ tablet TAKE 1 TABLET (10 MEQ TOTAL) BY MOUTH 2 (TWO) TIMES DAILY. 60 tablet 3  . lidocaine-prilocaine (EMLA) cream   3  . loratadine (CLARITIN) 10 MG tablet Take 10 mg by mouth daily.    . LORazepam (ATIVAN) 0.5 MG tablet Take 1 tablet (0.5 mg total) by  mouth 3 (three) times daily as needed for anxiety. 90 tablet 3  . meloxicam (MOBIC) 15 MG tablet Take 15 mg by mouth daily.    . ondansetron (ZOFRAN) 4 MG tablet TAKE 1 TABLET (4 MG TOTAL) BY MOUTH EVERY 6 (SIX) HOURS AS NEEDED FOR NAUSEA OR VOMITING. 30 tablet 3  . PARoxetine (PAXIL) 40 MG tablet TAKE 1 TABLET ONCE DAILY 30 tablet 3  . polyethylene glycol powder (GLYCOLAX/MIRALAX) powder 255 grams one bottle for colonoscopy prep 255 g 0  . promethazine (PHENERGAN) 25 MG suppository Place 1 suppository (  25 mg total) rectally every 6 (six) hours as needed for nausea. 12 suppository 1  . spironolactone (ALDACTONE) 100 MG tablet Take 1 tablet (100 mg total) by mouth daily. 30 tablet 6  . sulfamethoxazole-trimethoprim (BACTRIM DS,SEPTRA DS) 800-160 MG per tablet Take 1 tablet by mouth 2 (two) times daily. 4 tablet 0  . vitamin E 400 UNIT capsule Take 400 Units by mouth daily.    . fentaNYL (DURAGESIC - DOSED MCG/HR) 25 MCG/HR patch Place 1 patch (25 mcg total) onto the skin every 3 (three) days. 10 patch 0  . glipiZIDE (GLUCOTROL) 5 MG tablet TAKE 1 TABLET BY MOUTH 30 MINUTES BEFORE A MEAL TWICE A DAY 60 tablet 1  . lactulose (CHRONULAC) 10 GM/15ML solution Take 15 mLs (10 g total) by mouth 2 (two) times daily as needed for mild constipation. 240 mL 0  . oxyCODONE-acetaminophen (PERCOCET) 10-325 MG tablet Take 1 tablet by mouth every 4 (four) hours as needed for pain. 60 tablet 0  . predniSONE (DELTASONE) 20 MG tablet Take 1 tablet (20 mg total) by mouth daily with breakfast. 30 tablet 0   No current facility-administered medications for this visit.   Facility-Administered Medications Ordered in Other Visits  Medication Dose Route Frequency Provider Last Rate Last Dose  . sodium chloride 0.9 % injection 10 mL  10 mL Intracatheter PRN  , MD   10 mL at 02/18/15 1453    OBJECTIVE:  Filed Vitals:   08/03/15 1412  BP: 124/91  Pulse: 104  Temp: 96.3 F (35.7 C)     Body mass index is  31.62 kg/(m^2).    ECOG FS:1 - Symptomatic but completely ambulatory  PHYSICAL EXAM: GENERAL:  Well developed, well nourished, sitting comfortably in the exam room in no acute distress. MENTAL STATUS:  Alert and oriented to person, place and time. HEAD:alopecia i.  Normocephalic, atraumatic, face symmetric, no Cushingoid features. EYES:  Pupils equal round and reactive to light and accomodation.  No conjunctivitis or scleral icterus. ENT:  Oropharynx clear without lesion.  Tongue normal. Mucous membranes moist.  RESPIRATORY:  Clear to auscultation without rales, wheezes or rhonchi. CARDIOVASCULAR:  Regular rate and rhythm without murmur, rub or gallop. Abdomen: Tenderness in epigastric area.  Liver is not palpable.  No ascites. Oh palpable mass BACK:  No CVA tenderness.  No tenderness on percussion of the back or rib cage. SKIN:  No rashes, ulcers or lesions. EXTREMITIES: No edema, no skin discoloration or tenderness.  No palpable cords. LYMPH NODES: No palpable cervical, supraclavicular, axillary or inguinal adenopathy  NEUROLOGICAL: Unremarkable. PSYCH:  Appropriate. Neurological system: 6 nerve  palsy on the right side.  Possibility of right 7 nerve  Palsy Weakness in both lower extremity.  Complete examination is difficult.                    LAB RESULTS:  Infusion on 08/03/2015  Component Date Value Ref Range Status  . WBC 08/03/2015 12.0* 3.6 - 11.0 K/uL Final   A-LINE DRAW  . RBC 08/03/2015 5.19  3.80 - 5.20 MIL/uL Final  . Hemoglobin 08/03/2015 15.1  12.0 - 16.0 g/dL Final  . HCT 08/03/2015 44.0  35.0 - 47.0 % Final  . MCV 08/03/2015 84.6  80.0 - 100.0 fL Final  . MCH 08/03/2015 29.0  26.0 - 34.0 pg Final  . MCHC 08/03/2015 34.3  32.0 - 36.0 g/dL Final  . RDW 08/03/2015 18.0* 11.5 - 14.5 % Final  . Platelets 08/03/2015 89* 150 -   440 K/uL Final  . Neutrophils Relative % 08/03/2015 80   Final  . Neutro Abs 08/03/2015 9.6* 1.4 - 6.5 K/uL Final  . Lymphocytes Relative  08/03/2015 14   Final  . Lymphs Abs 08/03/2015 1.7  1.0 - 3.6 K/uL Final  . Monocytes Relative 08/03/2015 4   Final  . Monocytes Absolute 08/03/2015 0.5  0.2 - 0.9 K/uL Final  . Eosinophils Relative 08/03/2015 1   Final  . Eosinophils Absolute 08/03/2015 0.2  0 - 0.7 K/uL Final  . Basophils Relative 08/03/2015 1   Final  . Basophils Absolute 08/03/2015 0.1  0 - 0.1 K/uL Final  . Sodium 08/03/2015 136  135 - 145 mmol/L Final  . Potassium 08/03/2015 3.6  3.5 - 5.1 mmol/L Final  . Chloride 08/03/2015 96* 101 - 111 mmol/L Final  . CO2 08/03/2015 31  22 - 32 mmol/L Final  . Glucose, Bld 08/03/2015 105* 65 - 99 mg/dL Final  . BUN 08/03/2015 11  6 - 20 mg/dL Final  . Creatinine, Ser 08/03/2015 0.67  0.44 - 1.00 mg/dL Final  . Calcium 08/03/2015 9.2  8.9 - 10.3 mg/dL Final  . Total Protein 08/03/2015 6.6  6.5 - 8.1 g/dL Final  . Albumin 08/03/2015 3.8  3.5 - 5.0 g/dL Final  . AST 08/03/2015 31  15 - 41 U/L Final  . ALT 08/03/2015 23  14 - 54 U/L Final  . Alkaline Phosphatase 08/03/2015 126  38 - 126 U/L Final  . Total Bilirubin 08/03/2015 1.3* 0.3 - 1.2 mg/dL Final  . GFR calc non Af Amer 08/03/2015 >60  >60 mL/min Final  . GFR calc Af Amer 08/03/2015 >60  >60 mL/min Final   Comment: (NOTE) The eGFR has been calculated using the CKD EPI equation. This calculation has not been validated in all clinical situations. eGFR's persistently <60 mL/min signify possible Chronic Kidney Disease.   . Anion gap 08/03/2015 9  5 - 15 Final  . CA 27.29 08/03/2015 26.0  0.0 - 38.6 U/mL Final   Comment: (NOTE) [facility] Cent[facility]/ACS methodology Performed At: [facility] [address] [place], [place] [id number] [place] William F MD Ph:[phone]     Lab Results  Component Value Date   LABCA2 26.0 08/03/2015   IMPRESSION: 1. Similar appearance of the liver. 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 The posterior  right hepatic lobe dominant lesion is unchanged in size and may represent a hemangioma, given peripheral nodular enhancement. Portal venous hypertension, including periesophageal ascites. 2. No extrahepatic metastatic disease identified. 3. Possible constipation. No explanation for vomiting.   ASSESSMENT: Stage IV carcinoma of breast ER positive PR positive HER-2/neu tumor systemic response is excellent on eribulin Tumor markers are within normal limit Patient has developed symptomatic leptomeninges disease Patient will be started on intrathecal chemotherapy All the side effects of chemotherapy including myelosuppression, alopecia, nausea vomiting fatigue weakness.  Secondary infection, and   peripheral neuropathy .  Has been discussed in details. Informal consent has been obtained and will be documented by nurses in the chart Intent of chemotherapy is palliation and relief in symptoms and extending survival   Reason why her site in the later part of her skull has been prepared with Betadine followed by alcohol After observing all* sterile precaution with known curtailing butterfly needle 5 cc of CSF was drawn it was bloodstained Patient received 15 mg of intrathecal methotrexate Patient also was given IV Decadron and Zofran to control   nausea Patient tolerated treatment very well I had detailed discussion with family regarding overall poor prognosis discussion regarding palliative care options like hospice has been discussed. Total duration of visit was 35 minutes.  50% or more time was spent in counseling patient and family regarding prognosis and options of treatment and available resources     Breast cancer metastasized to liver   Staging form: Breast, AJCC 7th Edition     Clinical: Stage IV (T2, N3, M1) - Signed by  , MD on 02/18/2015    , MD   08/10/2015 8:26 AM 

## 2015-08-10 NOTE — Progress Notes (Unsigned)
Patient given premedication before intra thecal infusion.

## 2015-08-12 ENCOUNTER — Encounter: Payer: Self-pay | Admitting: Oncology

## 2015-08-12 NOTE — Progress Notes (Signed)
[place] @ [facility] [phone]  [phone] [place] OB: May 18, 1961  MR#: [id number]  JYN#:[id number]  Patient Care Team: Pcp Not In System as PCP - General Robert Bellow, MD (General Surgery)  CHIEF COMPLAINT:  Chief Complaint  Patient presents with  . OTHER    Chief Complaint/Problem List  1.  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 Status post Cytoxan, Adriamycin, followed by high dose chemotherapy and stem cell support.  Status post radiation therapy. 1996 2.  Was on Tamoxifen therapy.  3.  Progressive disease with the left supraclavicular lymph node positive on needle aspiration.  4.  Tamoxifen has been discontinued.  5.  Ovarian ablation with Lupron, Femara and radiation to the ovaries starting in December 2003. 6.  On Femara  7.  Rising tumor markers.  CT scan is positive for liver metastases.  And lung metastasesBiopsies positive for metastatic disease (liver biopsy) consistent with breast primary. November, 2014 [place] and progesterone receptor positive.  HER-2 receptor negative.. 8.  Abraxene starting  from September 03, 2013.  9.Patient was taken off Abraxane because of progressive side effect and started on ERIBULIN April 08, 2014    10.  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, 2016) Patient started on intrathecal methotrexate October 2 016    Oncology Flowsheet 05/26/2015 06/23/2015 06/26/2015 07/13/2015 07/14/2015 08/03/2015 08/10/2015  Day, Cycle Day 8, Cycle 4 - - - - Day 1, Cycle 1 cycle 2 , Cycle 1  dexamethasone (DECADRON) IV [ 10 mg ] - - [ 8 mg ] - [ 10 mg ] [ 10 mg ]  eriBULin mesylate (HALAVEN) IV 1.1 mg/m2 - - - - - -  LORazepam (ATIVAN) IV - 0.5 mg - - - - -  LORazepam (ATIVAN) PO - - - - 1 mg - -  methotrexate (PF) IT - - - - - [ 12 mg ] [ 12 mg ]  ondansetron (ZOFRAN) IV [ 8 mg ] [ 8 mg ] - [ 8 mg ] - [ 8 mg ] [  8 mg ]  pegfilgrastim (NEULASTA ONPRO KIT) [place] - - - - - - -  pegfilgrastim (NEULASTA)  - - - - - - -  prochlorperazine (COMPAZINE) IV - - 10 mg - - - -    INTERVAL HISTORY: 54 year old lady who has a 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  Patient is here for further follow-up and continuation of intrathecal methotrexate therapy Since last evaluation according to family she has some strength in lower extremity Nausea is better.  No headache.  Tolerated last chemotherapy without any significant side effect Here for further evaluation and treatment consideration       REVIEW OF SYSTEMS:   GENERAL:  Patient is feeling weak and tired.  Frequent fall Low-grade fever.  Aches and pains PERFORMANCE STATUS (ECOG):  2 HEENT:  No visual changes, runny nose, sore throat, mouth sores or tenderness. Lungs: No shortness of breath or cough.  No hemoptysis. Cardiac:  No chest pain, palpitations, orthopnea, or PND. GI: intermittent  Nausea and vomiting. He should not complaints of nausea and vomiting for last 2 days.  Epigastric discomfort.  Taking Nexium once a day GU:  No urgency, frequency, dysuria, or hematuria.June 09, 2015 Musculoskeletal:  No back pain.  No joint pain.  No muscle tenderness. Extremities:  No pain or swelling. Skin:  No rashes or skin changes. Neuro:  No headache, numbness or weakness, balance or coordination issues. Endocrine:  No diabetes, thyroid issues, hot flashes or night sweats. Psych:  No mood changes, depression or anxiety. Pain: Neck pain Review of systems:  All other systems reviewed and found to be negative. Epigastric discomfort which is persistent dull aching As per HPI. Otherwise, a complete review of systems is negatve.  PAST MEDICAL HISTORY: Past Medical History  Diagnosis Date  . Arthritis   . Diabetes mellitus without complication ([place])   . Colon polyps   . GERD (gastroesophageal  reflux disease)   . Cancer ([place]) 2002     LEFT MASTECTOMY  . Breast cancer, female ([place]) 1997  . Shortness of breath dyspnea   . Headache     PAST SURGICAL HISTORY: Past Surgical History  Procedure Laterality Date  . Mastectomy Left 2002  . Colonoscopy  [id number]    DR.[email or web address]  . Foot surgery Right   . Gastric endoscopy   2002  . Bone marrow transplant    . Cholecystectomy  1995  . Sigmoidoscopy  2013    FAMILY HISTORY Family History  Problem Relation Age of Onset  . Colon cancer Sister   . COPD Mother       ADVANCED DIRECTIVES:  Patient does   Not have advanced health care directive Patient does not have any advanced healthcare directive. Information has been given.     HEALTH MAINTENANCE: Social History  Substance Use Topics  . Smoking status: Never Smoker   . Smokeless tobacco: Never Used  . Alcohol Use: No      Allergies  Allergen Reactions  . Codeine Hives and Nausea Only    Other Reaction: GI Upset  . Latex Itching and Hives  . Shellfish Allergy Swelling    Lips    Current Outpatient Prescriptions  Medication Sig Dispense Refill  . Ascorbic Acid (VITAMIN C) 1000 MG tablet Take 1,000 mg by mouth daily.    . Calcium Carb-Cholecalciferol 600-400 MG-UNIT TABS Take by mouth.    . calcium carbonate (OS-CAL - DOSED IN MG OF ELEMENTAL CALCIUM) 1250 MG tablet Take 1 tablet by mouth daily.    Marland Kitchen esomeprazole (NEXIUM) 40 MG capsule Take 40 mg by mouth 2 (two) times daily before a meal.     . fentaNYL (DURAGESIC - DOSED MCG/HR) 25 MCG/HR patch Place 1 patch (25 mcg total) onto the skin every 3 (three) days. 10 patch 0  . ferrous sulfate 325 (65 FE) MG tablet Take by mouth.    . furosemide (LASIX) 20 MG tablet TAKE 1 TABLET EVERY DAY 30 tablet 4  . glipiZIDE (GLUCOTROL) 5 MG tablet TAKE 1 TABLET BY MOUTH 30 MINUTES BEFORE A MEAL TWICE A DAY 60 tablet 1  . KLOR-CON 10 10 MEQ tablet TAKE 1 TABLET (10 MEQ TOTAL) BY MOUTH 2 (TWO) TIMES DAILY. 60 tablet 3  .  lactulose (CHRONULAC) 10 GM/15ML solution Take 15 mLs (10 g total) by mouth 2 (two) times daily as needed for mild constipation. 240 mL 0  . lidocaine-prilocaine (EMLA) cream   3  . loratadine (CLARITIN) 10 MG tablet Take 10 mg by mouth daily.    Marland Kitchen LORazepam (ATIVAN) 0.5 MG tablet Take 1 tablet (0.5 mg total) by mouth 3 (three) times daily as needed for anxiety. 90 tablet 3  . meloxicam (MOBIC) 15 MG tablet Take 15 mg by mouth daily.    . ondansetron (ZOFRAN) 4  MG tablet TAKE 1 TABLET (4 MG TOTAL) BY MOUTH EVERY 6 (SIX) HOURS AS NEEDED FOR NAUSEA OR VOMITING. 30 tablet 3  . oxyCODONE-acetaminophen (PERCOCET) 10-325 MG tablet Take 1 tablet by mouth every 4 (four) hours as needed for pain. 60 tablet 0  . PARoxetine (PAXIL) 40 MG tablet TAKE 1 TABLET ONCE DAILY 30 tablet 3  . polyethylene glycol powder (GLYCOLAX/MIRALAX) powder 255 grams one bottle for colonoscopy prep 255 g 0  . predniSONE (DELTASONE) 20 MG tablet Take 1 tablet (20 mg total) by mouth daily with breakfast. 30 tablet 0  . promethazine (PHENERGAN) 25 MG suppository Place 1 suppository (25 mg total) rectally every 6 (six) hours as needed for nausea. 12 suppository 1  . spironolactone (ALDACTONE) 100 MG tablet Take 1 tablet (100 mg total) by mouth daily. 30 tablet 6  . sulfamethoxazole-trimethoprim (BACTRIM DS,SEPTRA DS) 800-160 MG per tablet Take 1 tablet by mouth 2 (two) times daily. 4 tablet 0  . vitamin E 400 UNIT capsule Take 400 Units by mouth daily.     No current facility-administered medications for this visit.   Facility-Administered Medications Ordered in Other Visits  Medication Dose Route Frequency Provider Last Rate Last Dose  . sodium chloride 0.9 % injection 10 mL  10 mL Intracatheter PRN Forest Gleason, MD   10 mL at 02/18/15 1453  . sodium chloride 0.9 % injection 10 mL  10 mL Intracatheter PRN Forest Gleason, MD   10 mL at 08/10/15 1400    OBJECTIVE:  Filed Vitals:   08/10/15 1658  Temp: 96.7 F (35.9 C)     Body  mass index is 29.79 kg/(m^2).    ECOG FS:1 - Symptomatic but completely ambulatory  PHYSICAL EXAM: GENERAL:  Well developed, well nourished, sitting comfortably in the exam room in no acute distress. MENTAL STATUS:  Alert and oriented to person, place and time. HEAD:alopecia i.  Normocephalic, atraumatic, face symmetric, no Cushingoid features. EYES:  Pupils equal round and reactive to light and accomodation.  No conjunctivitis or scleral icterus. ENT:  Oropharynx clear without lesion.  Tongue normal. Mucous membranes moist.  RESPIRATORY:  Clear to auscultation without rales, wheezes or rhonchi. CARDIOVASCULAR:  Regular rate and rhythm without murmur, rub or gallop. Abdomen: Tenderness in epigastric area.  Liver is not palpable.  No ascites. Oh palpable mass BACK:  No CVA tenderness.  No tenderness on percussion of the back or rib cage. SKIN:  No rashes, ulcers or lesions. EXTREMITIES: No edema, no skin discoloration or tenderness.  No palpable cords. LYMPH NODES: No palpable cervical, supraclavicular, axillary or inguinal adenopathy  NEUROLOGICAL: Unremarkable. PSYCH:  Appropriate. Neurological system: 6 nerve  palsy on the right side.  Possibility of right 7 nerve  Palsy Weakness in both lower extremity.  Complete examination is difficult.                    LAB RESULTS:  Office Visit on 08/10/2015  Component Date Value Ref Range Status  . Tube # 08/10/2015  RECEIVED STERILE CUP   Final  . Color, CSF 08/10/2015 COLORLESS  COLORLESS Final  . Appearance, CSF 08/10/2015 CLEAR* CLEAR Final  . Supernatant 08/10/2015 CLEAR   Final  . RBC Count, CSF 08/10/2015 343* 0 - 3 /cu mm Final  . WBC, CSF 08/10/2015 8   Final  . Segmented Neutrophils-CSF 08/10/2015 10   Final  . Lymphs, CSF 08/10/2015 70   Final  . Monocyte-Macrophage-Spinal Fluid 08/10/2015 20   Final  .  Eosinophils, CSF 08/10/2015 0   Final  . Other Cells, CSF 08/10/2015 0   Final  . Total  Protein, CSF 08/10/2015 29  15 - 45  mg/dL Final  Clinical Support on 08/10/2015  Component Date Value Ref Range Status  . WBC 08/10/2015 11.0  3.6 - 11.0 K/uL Final   A-LINE DRAW  . RBC 08/10/2015 5.47* 3.80 - 5.20 MIL/uL Final  . Hemoglobin 08/10/2015 15.7  12.0 - 16.0 g/dL Final  . HCT 08/10/2015 46.2  35.0 - 47.0 % Final  . MCV 08/10/2015 84.4  80.0 - 100.0 fL Final  . MCH 08/10/2015 28.8  26.0 - 34.0 pg Final  . MCHC 08/10/2015 34.1  32.0 - 36.0 g/dL Final  . RDW 08/10/2015 17.9* 11.5 - 14.5 % Final  . Platelets 08/10/2015 83* 150 - 440 K/uL Final  . Neutrophils Relative % 08/10/2015 85   Final  . Neutro Abs 08/10/2015 9.3* 1.4 - 6.5 K/uL Final  . Lymphocytes Relative 08/10/2015 12   Final  . Lymphs Abs 08/10/2015 1.3  1.0 - 3.6 K/uL Final  . Monocytes Relative 08/10/2015 3   Final  . Monocytes Absolute 08/10/2015 0.3  0.2 - 0.9 K/uL Final  . Eosinophils Relative 08/10/2015 0   Final  . Eosinophils Absolute 08/10/2015 0.0  0 - 0.7 K/uL Final  . Basophils Relative 08/10/2015 0   Final  . Basophils Absolute 08/10/2015 0.0  0 - 0.1 K/uL Final  . Sodium 08/10/2015 132* 135 - 145 mmol/L Final  . Potassium 08/10/2015 3.8  3.5 - 5.1 mmol/L Final  . Chloride 08/10/2015 93* 101 - 111 mmol/L Final  . CO2 08/10/2015 29  22 - 32 mmol/L Final  . Glucose, Bld 08/10/2015 152* 65 - 99 mg/dL Final  . BUN 08/10/2015 17  6 - 20 mg/dL Final  . Creatinine, Ser 08/10/2015 0.68  0.44 - 1.00 mg/dL Final  . Calcium 08/10/2015 9.2  8.9 - 10.3 mg/dL Final  . Total Protein 08/10/2015 6.8  6.5 - 8.1 g/dL Final  . Albumin 08/10/2015 3.8  3.5 - 5.0 g/dL Final  . AST 08/10/2015 70* 15 - 41 U/L Final  . ALT 08/10/2015 87* 14 - 54 U/L Final  . Alkaline Phosphatase 08/10/2015 141* 38 - 126 U/L Final  . Total Bilirubin 08/10/2015 2.5* 0.3 - 1.2 mg/dL Final  . GFR calc non Af Amer 08/10/2015 >60  >60 mL/min Final  . GFR calc Af Amer 08/10/2015 >60  >60 mL/min Final   Comment: (NOTE) The eGFR has been calculated using the CKD EPI equation. This  calculation has not been validated in all clinical situations. eGFR's persistently <60 mL/min signify possible Chronic Kidney Disease.   . Anion gap 08/10/2015 10  5 - 15 Final    Lab Results  Component Value Date   LABCA2 26.0 08/03/2015   .   ASSESSMENT: Stage IV carcinoma of breast ER positive PR positive HER-2/neu tumor systemic response is excellent on eribulin All lab data has been reviewed Leptomeninges disease proceed with cycle 2 of intrathecal methotrexate Site has been prepared with Betadine and cleaned 5 cc of CSF fluid was removed was bloodstained was sent for appropriate examination 12 mg of methotrexate was injected Patient had a Decadron and IV Zofran prior to intrathecal treatment Hospital bed as well as advanced home health has been instructed to do physiotherapy for lower extremity Occupational therapy regarding training for self-care Hold intravenous chemotherapy     Breast cancer metastasized to liver  Staging form: Breast, AJCC 7th Edition     Clinical: Stage IV (T2, N3, M1) - Signed by Forest Gleason, MD on 02/18/2015   Forest Gleason, MD   08/12/2015 8:12 AM

## 2015-08-17 ENCOUNTER — Inpatient Hospital Stay: Payer: Managed Care, Other (non HMO) | Attending: Oncology | Admitting: Oncology

## 2015-08-17 ENCOUNTER — Inpatient Hospital Stay: Payer: Managed Care, Other (non HMO)

## 2015-08-17 VITALS — BP 110/77 | HR 96 | Temp 95.3°F

## 2015-08-17 DIAGNOSIS — Z7984 Long term (current) use of oral hypoglycemic drugs: Secondary | ICD-10-CM | POA: Insufficient documentation

## 2015-08-17 DIAGNOSIS — R531 Weakness: Secondary | ICD-10-CM | POA: Diagnosis not present

## 2015-08-17 DIAGNOSIS — C50919 Malignant neoplasm of unspecified site of unspecified female breast: Secondary | ICD-10-CM

## 2015-08-17 DIAGNOSIS — C7932 Secondary malignant neoplasm of cerebral meninges: Secondary | ICD-10-CM | POA: Insufficient documentation

## 2015-08-17 DIAGNOSIS — C7800 Secondary malignant neoplasm of unspecified lung: Secondary | ICD-10-CM | POA: Diagnosis not present

## 2015-08-17 DIAGNOSIS — R630 Anorexia: Secondary | ICD-10-CM | POA: Diagnosis not present

## 2015-08-17 DIAGNOSIS — R509 Fever, unspecified: Secondary | ICD-10-CM | POA: Diagnosis not present

## 2015-08-17 DIAGNOSIS — E119 Type 2 diabetes mellitus without complications: Secondary | ICD-10-CM | POA: Diagnosis not present

## 2015-08-17 DIAGNOSIS — R110 Nausea: Secondary | ICD-10-CM | POA: Insufficient documentation

## 2015-08-17 DIAGNOSIS — R0602 Shortness of breath: Secondary | ICD-10-CM | POA: Diagnosis not present

## 2015-08-17 DIAGNOSIS — C787 Secondary malignant neoplasm of liver and intrahepatic bile duct: Principal | ICD-10-CM

## 2015-08-17 DIAGNOSIS — C779 Secondary and unspecified malignant neoplasm of lymph node, unspecified: Secondary | ICD-10-CM | POA: Insufficient documentation

## 2015-08-17 DIAGNOSIS — C7949 Secondary malignant neoplasm of other parts of nervous system: Secondary | ICD-10-CM | POA: Diagnosis not present

## 2015-08-17 DIAGNOSIS — R410 Disorientation, unspecified: Secondary | ICD-10-CM | POA: Insufficient documentation

## 2015-08-17 DIAGNOSIS — H4921 Sixth [abducent] nerve palsy, right eye: Secondary | ICD-10-CM | POA: Insufficient documentation

## 2015-08-17 DIAGNOSIS — K219 Gastro-esophageal reflux disease without esophagitis: Secondary | ICD-10-CM | POA: Diagnosis not present

## 2015-08-17 DIAGNOSIS — R51 Headache: Secondary | ICD-10-CM | POA: Diagnosis not present

## 2015-08-17 DIAGNOSIS — Z923 Personal history of irradiation: Secondary | ICD-10-CM | POA: Diagnosis not present

## 2015-08-17 DIAGNOSIS — C7951 Secondary malignant neoplasm of bone: Secondary | ICD-10-CM | POA: Diagnosis not present

## 2015-08-17 DIAGNOSIS — Z86010 Personal history of colonic polyps: Secondary | ICD-10-CM | POA: Diagnosis not present

## 2015-08-17 DIAGNOSIS — Z5111 Encounter for antineoplastic chemotherapy: Secondary | ICD-10-CM | POA: Insufficient documentation

## 2015-08-17 DIAGNOSIS — Z79899 Other long term (current) drug therapy: Secondary | ICD-10-CM | POA: Insufficient documentation

## 2015-08-17 DIAGNOSIS — R5383 Other fatigue: Secondary | ICD-10-CM

## 2015-08-17 LAB — CBC WITH DIFFERENTIAL/PLATELET
Basophils Absolute: 0.1 10*3/uL (ref 0–0.1)
Basophils Relative: 1 %
EOS ABS: 0.1 10*3/uL (ref 0–0.7)
EOS PCT: 2 %
HCT: 47.1 % — ABNORMAL HIGH (ref 35.0–47.0)
Hemoglobin: 16.2 g/dL — ABNORMAL HIGH (ref 12.0–16.0)
LYMPHS ABS: 1.6 10*3/uL (ref 1.0–3.6)
Lymphocytes Relative: 16 %
MCH: 29.2 pg (ref 26.0–34.0)
MCHC: 34.4 g/dL (ref 32.0–36.0)
MCV: 84.9 fL (ref 80.0–100.0)
MONO ABS: 0.5 10*3/uL (ref 0.2–0.9)
MONOS PCT: 5 %
Neutro Abs: 7.6 10*3/uL — ABNORMAL HIGH (ref 1.4–6.5)
Neutrophils Relative %: 76 %
PLATELETS: 80 10*3/uL — AB (ref 150–440)
RBC: 5.54 MIL/uL — ABNORMAL HIGH (ref 3.80–5.20)
RDW: 17.8 % — AB (ref 11.5–14.5)
WBC: 9.9 10*3/uL (ref 3.6–11.0)

## 2015-08-17 LAB — CSF CELL COUNT WITH DIFFERENTIAL
Eosinophils, CSF: 0 %
Lymphs, CSF: 67 %
Monocyte-Macrophage-Spinal Fluid: 0 %
Other Cells, CSF: 0
RBC COUNT CSF: 106 /mm3 — AB (ref 0–3)
SEGMENTED NEUTROPHILS-CSF: 33 %
WBC, CSF: 5 /mm3

## 2015-08-17 LAB — COMPREHENSIVE METABOLIC PANEL
ALT: 85 U/L — ABNORMAL HIGH (ref 14–54)
ANION GAP: 10 (ref 5–15)
AST: 52 U/L — ABNORMAL HIGH (ref 15–41)
Albumin: 3.6 g/dL (ref 3.5–5.0)
Alkaline Phosphatase: 111 U/L (ref 38–126)
BUN: 13 mg/dL (ref 6–20)
CHLORIDE: 93 mmol/L — AB (ref 101–111)
CO2: 30 mmol/L (ref 22–32)
Calcium: 9.7 mg/dL (ref 8.9–10.3)
Creatinine, Ser: 0.62 mg/dL (ref 0.44–1.00)
GFR calc non Af Amer: >60 mL/min (ref >60)
Glucose, Bld: 149 mg/dL — ABNORMAL HIGH (ref 65–99)
Potassium: 3.8 mmol/L (ref 3.5–5.1)
SODIUM: 133 mmol/L — AB (ref 135–145)
Total Bilirubin: 1.9 mg/dL — ABNORMAL HIGH (ref 0.3–1.2)
Total Protein: 6 g/dL — ABNORMAL LOW (ref 6.5–8.1)

## 2015-08-17 LAB — PROTEIN, CSF: TOTAL PROTEIN, CSF: 23 mg/dL (ref 15–45)

## 2015-08-17 MED ORDER — SODIUM CHLORIDE 0.9 % IJ SOLN
Freq: Once | INTRAMUSCULAR | Status: AC
  Administered 2015-08-17: 16:00:00 via INTRATHECAL
  Filled 2015-08-17: qty 0.48

## 2015-08-17 MED ORDER — SODIUM CHLORIDE 0.9 % IV SOLN
Freq: Once | INTRAVENOUS | Status: AC
  Administered 2015-08-17: 14:00:00 via INTRAVENOUS
  Filled 2015-08-17: qty 4

## 2015-08-17 MED ORDER — HEPARIN SOD (PORK) LOCK FLUSH 100 UNIT/ML IV SOLN
500.0000 [IU] | Freq: Once | INTRAVENOUS | Status: AC
  Administered 2015-08-17: 500 [IU] via INTRAVENOUS

## 2015-08-20 ENCOUNTER — Telehealth: Payer: Self-pay | Admitting: *Deleted

## 2015-08-20 DIAGNOSIS — C50919 Malignant neoplasm of unspecified site of unspecified female breast: Secondary | ICD-10-CM

## 2015-08-20 DIAGNOSIS — C787 Secondary malignant neoplasm of liver and intrahepatic bile duct: Principal | ICD-10-CM

## 2015-08-20 MED ORDER — OXYCODONE-ACETAMINOPHEN 10-325 MG PO TABS: 1.0000 | ORAL_TABLET | ORAL | Status: DC | PRN

## 2015-08-20 NOTE — Telephone Encounter (Signed)
RX ready for pick up at registration desk.

## 2015-08-20 NOTE — Telephone Encounter (Signed)
Mr. Ziesmer requesting refill for Oxycodone.

## 2015-08-20 NOTE — Telephone Encounter (Signed)
Called Mr Semel to let him know prescription is available for Mrs. Hensley at the registration desk.

## 2015-08-21 ENCOUNTER — Encounter: Payer: Self-pay | Admitting: Oncology

## 2015-08-21 NOTE — Progress Notes (Signed)
[place] @ [facility] [phone]  [phone]) [place] OB: 08/16/1961  MR#: [id number]  IPJ#:[id number]  Patient Care Team: Pcp Not In System as PCP - General Robert Bellow, MD (General Surgery)  CHIEF COMPLAINT:  Chief Complaint  Patient presents with  . OTHER    Chief Complaint/Problem List  1.  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 Status post Cytoxan, Adriamycin, followed by high dose chemotherapy and stem cell support.  Status post radiation therapy. 1996 2.  Was on Tamoxifen therapy.  3.  Progressive disease with the left supraclavicular lymph node positive on needle aspiration.  4.  Tamoxifen has been discontinued.  5.  Ovarian ablation with Lupron, Femara and radiation to the ovaries starting in December 2003. 6.  On Femara  7.  Rising tumor markers.  CT scan is positive for liver metastases.  And lung metastasesBiopsies positive for metastatic disease (liver biopsy) consistent with breast primary. November, 2014 [place] and progesterone receptor positive.  HER-2 receptor negative.. 8.  Abraxene starting  from September 03, 2013.  9.Patient was taken off Abraxane because of progressive side effect and started on ERIBULIN April 08, 2014    10.  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, 2016) Patient started on intrathecal methotrexate October 2 016    Oncology Flowsheet 06/23/2015 06/26/2015 07/13/2015 07/14/2015 08/03/2015 08/10/2015 08/17/2015  Day, Cycle - - - - Day 1, Cycle 1 cycle 2 , Cycle 1 Day 1, Cycle 2  dexamethasone (DECADRON) IV - - [ 8 mg ] - [ 10 mg ] [ 10 mg ] [ 10 mg ]  eriBULin mesylate (HALAVEN) IV - - - - - - -  LORazepam (ATIVAN) IV 0.5 mg - - - - - -  LORazepam (ATIVAN) PO - - - 1 mg - - -  methotrexate (PF) IT - - - - [ 12 mg ] [ 12 mg ] [ 12 mg ]  ondansetron (ZOFRAN) IV [ 8 mg ] - [ 8 mg ] - [ 8 mg ] [ 8 mg ] [  8 mg ]  pegfilgrastim (NEULASTA ONPRO KIT) [place] - - - - - - -  pegfilgrastim (NEULASTA) [place] - - - - - - -  prochlorperazine (COMPAZINE) IV - 10 mg - - - - -    INTERVAL HISTORY: 54 year old lady who has a 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  Patient is here for further follow-up and continuation of intrathecal methotrexate therapy Since last evaluation according to family she has some strength in lower extremity Nausea is better.  No headache.  Tolerated last chemotherapy without any significant side effect Here for further evaluation and treatment consideration Patient is being evaluated for further treatment consideration regarding intrathecal chemotherapy Continues to feel weak and tired appetite is still poor. No significant improvement in lower extremity weakness. Patient had some nausea so IV Decadron and Zofran was given prior to intrathecal medication Physiotherapy in place       REVIEW OF SYSTEMS:   GENERAL:  Patient is feeling weak and tired.  Frequent fall Low-grade fever.  Aches and pains PERFORMANCE STATUS (ECOG):  2 HEENT:  No visual changes, runny nose, sore throat, mouth sores or tenderness. Lungs: No shortness of breath or cough.  No hemoptysis. Cardiac:  No chest pain, palpitations, orthopnea, or PND. GI: intermittent  Nausea and vomiting. He should not complaints of nausea and vomiting for last 2 days.  Epigastric discomfort.  Taking Nexium once a day GU:  No urgency, frequency, dysuria, or hematuria.June 09, 2015 Musculoskeletal:  No back pain.  No joint pain.  No muscle tenderness. Extremities:  No pain or swelling. Skin:  No rashes or skin changes. Neuro:  No headache, numbness or weakness, balance or coordination issues. Endocrine:  No diabetes, thyroid issues, hot flashes or night sweats. Psych:  No mood changes, depression or anxiety. Pain: Neck pain Review of systems:  All other systems  reviewed and found to be negative. Epigastric discomfort which is persistent dull aching As per HPI. Otherwise, a complete review of systems is negatve.  PAST MEDICAL HISTORY: Past Medical History  Diagnosis Date  . Arthritis   . Diabetes mellitus without complication ([place])   . Colon polyps   . GERD (gastroesophageal reflux disease)   . Cancer ([place]) 2002     LEFT MASTECTOMY  . Breast cancer, female ([place]) 1997  . Shortness of breath dyspnea   . Headache     PAST SURGICAL HISTORY: Past Surgical History  Procedure Laterality Date  . Mastectomy Left 2002  . Colonoscopy  [id number]    [email or web address]  . Foot surgery Right   . Gastric endoscopy   2002  . Bone marrow transplant    . Cholecystectomy  1995  . Sigmoidoscopy  2013    FAMILY HISTORY Family History  Problem Relation Age of Onset  . Colon cancer Sister   . COPD Mother       ADVANCED DIRECTIVES:  Patient does   Not have advanced health care directive Patient does not have any advanced healthcare directive. Information has been given.     HEALTH MAINTENANCE: Social History  Substance Use Topics  . Smoking status: Never Smoker   . Smokeless tobacco: Never Used  . Alcohol Use: No      Allergies  Allergen Reactions  . Codeine Hives and Nausea Only    Other Reaction: GI Upset  . Latex Itching and Hives  . Shellfish Allergy Swelling    Lips    Current Outpatient Prescriptions  Medication Sig Dispense Refill  . Ascorbic Acid (VITAMIN C) 1000 MG tablet Take 1,000 mg by mouth daily.    . Calcium Carb-Cholecalciferol 600-400 MG-UNIT TABS Take by mouth.    . calcium carbonate (OS-CAL - DOSED IN MG OF ELEMENTAL CALCIUM) 1250 MG tablet Take 1 tablet by mouth daily.    Marland Kitchen esomeprazole (NEXIUM) 40 MG capsule Take 40 mg by mouth 2 (two) times daily before a meal.     . fentaNYL (DURAGESIC - DOSED MCG/HR) 25 MCG/HR patch Place 1 patch (25 mcg total) onto the skin every 3 (three) days. 10 patch 0  . ferrous sulfate  325 (65 FE) MG tablet Take by mouth.    . furosemide (LASIX) 20 MG tablet TAKE 1 TABLET EVERY DAY 30 tablet 4  . glipiZIDE (GLUCOTROL) 5 MG tablet TAKE 1 TABLET BY MOUTH 30 MINUTES BEFORE A MEAL TWICE A DAY 60 tablet 1  . KLOR-CON 10 10 MEQ tablet TAKE 1 TABLET (10 MEQ TOTAL) BY MOUTH 2 (TWO) TIMES DAILY. 60 tablet 3  . lactulose (CHRONULAC) 10 GM/15ML solution Take 15 mLs (10 g total) by mouth 2 (two) times daily as needed for mild constipation. 240 mL 0  . lidocaine-prilocaine (EMLA) cream   3  . loratadine (CLARITIN) 10 MG tablet Take 10 mg by mouth  daily.    Marland Kitchen LORazepam (ATIVAN) 0.5 MG tablet Take 1 tablet (0.5 mg total) by mouth 3 (three) times daily as needed for anxiety. 90 tablet 3  . meloxicam (MOBIC) 15 MG tablet Take 15 mg by mouth daily.    . ondansetron (ZOFRAN) 4 MG tablet TAKE 1 TABLET (4 MG TOTAL) BY MOUTH EVERY 6 (SIX) HOURS AS NEEDED FOR NAUSEA OR VOMITING. 30 tablet 3  . PARoxetine (PAXIL) 40 MG tablet TAKE 1 TABLET ONCE DAILY 30 tablet 3  . polyethylene glycol powder (GLYCOLAX/MIRALAX) powder 255 grams one bottle for colonoscopy prep 255 g 0  . predniSONE (DELTASONE) 20 MG tablet Take 1 tablet (20 mg total) by mouth daily with breakfast. 30 tablet 0  . promethazine (PHENERGAN) 25 MG suppository Place 1 suppository (25 mg total) rectally every 6 (six) hours as needed for nausea. 12 suppository 1  . spironolactone (ALDACTONE) 100 MG tablet Take 1 tablet (100 mg total) by mouth daily. 30 tablet 6  . sulfamethoxazole-trimethoprim (BACTRIM DS,SEPTRA DS) 800-160 MG per tablet Take 1 tablet by mouth 2 (two) times daily. 4 tablet 0  . vitamin E 400 UNIT capsule Take 400 Units by mouth daily.    Marland Kitchen oxyCODONE-acetaminophen (PERCOCET) 10-325 MG tablet Take 1 tablet by mouth every 4 (four) hours as needed for pain. 60 tablet 0   No current facility-administered medications for this visit.   Facility-Administered Medications Ordered in Other Visits  Medication Dose Route Frequency  Provider Last Rate Last Dose  . sodium chloride 0.9 % injection 10 mL  10 mL Intracatheter PRN Forest Gleason, MD   10 mL at 02/18/15 1453  . sodium chloride 0.9 % injection 10 mL  10 mL Intracatheter PRN Forest Gleason, MD   10 mL at 08/10/15 1400    OBJECTIVE:  Filed Vitals:   08/17/15 1459  BP: 110/77  Pulse: 96  Temp: 95.3 F (35.2 C)     There is no weight on file to calculate BMI.    ECOG FS:1 - Symptomatic but completely ambulatory  PHYSICAL EXAM: GENERAL:  Well developed, well nourished, sitting comfortably in the exam room in no acute distress. MENTAL STATUS:  Alert and oriented to person, place and time. HEAD:alopecia i.  Normocephalic, atraumatic, face symmetric, no Cushingoid features. EYES:  Pupils equal round and reactive to light and accomodation.  No conjunctivitis or scleral icterus. ENT:  Oropharynx clear without lesion.  Tongue normal. Mucous membranes moist.  RESPIRATORY:  Clear to auscultation without rales, wheezes or rhonchi. CARDIOVASCULAR:  Regular rate and rhythm without murmur, rub or gallop. Abdomen: Tenderness in epigastric area.  Liver is not palpable.  No ascites. Oh palpable mass BACK:  No CVA tenderness.  No tenderness on percussion of the back or rib cage. SKIN:  No rashes, ulcers or lesions. EXTREMITIES: No edema, no skin discoloration or tenderness.  No palpable cords. LYMPH NODES: No palpable cervical, supraclavicular, axillary or inguinal adenopathy  NEUROLOGICAL: Unremarkable. PSYCH:  Appropriate. Neurological system: 6 nerve  palsy on the right side.  Possibility of right 7 nerve  Palsy Weakness in both lower extremity.  Complete examination is difficult.                    LAB RESULTS:  Office Visit on 08/17/2015  Component Date Value Ref Range Status  . WBC 08/17/2015 9.9  3.6 - 11.0 K/uL Final   A-LINE DRAW  . RBC 08/17/2015 5.54* 3.80 - 5.20 MIL/uL Final  . Hemoglobin 08/17/2015 16.2*  12.0 - 16.0 g/dL Final  . HCT 08/17/2015 47.1*  35.0 - 47.0 % Final  . MCV 08/17/2015 84.9  80.0 - 100.0 fL Final  . MCH 08/17/2015 29.2  26.0 - 34.0 pg Final  . MCHC 08/17/2015 34.4  32.0 - 36.0 g/dL Final  . RDW 08/17/2015 17.8* 11.5 - 14.5 % Final  . Platelets 08/17/2015 80* 150 - 440 K/uL Final  . Neutrophils Relative % 08/17/2015 76   Final  . Neutro Abs 08/17/2015 7.6* 1.4 - 6.5 K/uL Final  . Lymphocytes Relative 08/17/2015 16   Final  . Lymphs Abs 08/17/2015 1.6  1.0 - 3.6 K/uL Final  . Monocytes Relative 08/17/2015 5   Final  . Monocytes Absolute 08/17/2015 0.5  0.2 - 0.9 K/uL Final  . Eosinophils Relative 08/17/2015 2   Final  . Eosinophils Absolute 08/17/2015 0.1  0 - 0.7 K/uL Final  . Basophils Relative 08/17/2015 1   Final  . Basophils Absolute 08/17/2015 0.1  0 - 0.1 K/uL Final  . Sodium 08/17/2015 133* 135 - 145 mmol/L Final  . Potassium 08/17/2015 3.8  3.5 - 5.1 mmol/L Final  . Chloride 08/17/2015 93* 101 - 111 mmol/L Final  . CO2 08/17/2015 30  22 - 32 mmol/L Final  . Glucose, Bld 08/17/2015 149* 65 - 99 mg/dL Final  . BUN 08/17/2015 13  6 - 20 mg/dL Final  . Creatinine, Ser 08/17/2015 0.62  0.44 - 1.00 mg/dL Final  . Calcium 08/17/2015 9.7  8.9 - 10.3 mg/dL Final  . Total Protein 08/17/2015 6.0* 6.5 - 8.1 g/dL Final  . Albumin 08/17/2015 3.6  3.5 - 5.0 g/dL Final  . AST 08/17/2015 52* 15 - 41 U/L Final  . ALT 08/17/2015 85* 14 - 54 U/L Final  . Alkaline Phosphatase 08/17/2015 111  38 - 126 U/L Final  . Total Bilirubin 08/17/2015 1.9* 0.3 - 1.2 mg/dL Final  . GFR calc non Af Amer 08/17/2015 >60  >60 mL/min Final  . GFR calc Af Amer 08/17/2015 >60  >60 mL/min Final   Comment: (NOTE) The eGFR has been calculated using the CKD EPI equation. This calculation has not been validated in all clinical situations. eGFR's persistently <60 mL/min signify possible Chronic Kidney Disease.   . Anion gap 08/17/2015 10  5 - 15 Final  . Tube # 08/17/2015 STERILE CONTAINER   Final  . Color, CSF 08/17/2015 CLEAR* COLORLESS  Final  . Appearance, CSF 08/17/2015 COLORLESS* CLEAR Final  . Supernatant 08/17/2015 CLEAR   Final  . RBC Count, CSF 08/17/2015 106* 0 - 3 /cu mm Final  . WBC, CSF 08/17/2015 5   Final  . Segmented Neutrophils-CSF 08/17/2015 33   Final  . Lymphs, CSF 08/17/2015 67   Final  . Monocyte-Macrophage-Spinal Fluid 08/17/2015 0   Final  . Eosinophils, CSF 08/17/2015 0   Final  . Other Cells, CSF 08/17/2015 0   Final  . Total  Protein, CSF 08/17/2015 23  15 - 45 mg/dL Final    Lab Results  Component Value Date   LABCA2 26.0 08/03/2015   .   ASSESSMENT: Stage IV carcinoma of breast ER positive PR positive HER-2/neu tumor systemic response is excellent on eribulin All lab data has been reviewed Leptomeninges disease proceed with cycle 2 of intrathecal methotrexate Site has been prepared with Betadine and cleaned 5 cc of CSF fluid was removed was bloodstained was sent for appropriate examination 12 mg of methotrexate was injected Patient had a Decadron and IV Zofran  prior to intrathecal treatment Hospital bed as well as advanced home health has been instructed to do physiotherapy for lower extremity Occupational therapy regarding training for self-care Hold intravenous chemotherapy     Breast cancer metastasized to liver   Staging form: Breast, AJCC 7th Edition     Clinical: Stage IV (T2, N3, M1) - Signed by Forest Gleason, MD on 02/18/2015   Forest Gleason, MD   08/21/2015 12:57 PM

## 2015-08-24 ENCOUNTER — Inpatient Hospital Stay: Payer: Managed Care, Other (non HMO)

## 2015-08-24 ENCOUNTER — Inpatient Hospital Stay (HOSPITAL_BASED_OUTPATIENT_CLINIC_OR_DEPARTMENT_OTHER): Payer: Managed Care, Other (non HMO) | Admitting: Oncology

## 2015-08-24 ENCOUNTER — Encounter: Payer: Self-pay | Admitting: Oncology

## 2015-08-24 VITALS — BP 108/77 | HR 129 | Temp 95.4°F

## 2015-08-24 DIAGNOSIS — R0602 Shortness of breath: Secondary | ICD-10-CM

## 2015-08-24 DIAGNOSIS — C50919 Malignant neoplasm of unspecified site of unspecified female breast: Secondary | ICD-10-CM

## 2015-08-24 DIAGNOSIS — C779 Secondary and unspecified malignant neoplasm of lymph node, unspecified: Secondary | ICD-10-CM

## 2015-08-24 DIAGNOSIS — H4921 Sixth [abducent] nerve palsy, right eye: Secondary | ICD-10-CM

## 2015-08-24 DIAGNOSIS — Z794 Long term (current) use of insulin: Secondary | ICD-10-CM

## 2015-08-24 DIAGNOSIS — C7932 Secondary malignant neoplasm of cerebral meninges: Secondary | ICD-10-CM

## 2015-08-24 DIAGNOSIS — C787 Secondary malignant neoplasm of liver and intrahepatic bile duct: Principal | ICD-10-CM

## 2015-08-24 DIAGNOSIS — R5383 Other fatigue: Secondary | ICD-10-CM

## 2015-08-24 DIAGNOSIS — Z79899 Other long term (current) drug therapy: Secondary | ICD-10-CM

## 2015-08-24 DIAGNOSIS — R110 Nausea: Secondary | ICD-10-CM

## 2015-08-24 DIAGNOSIS — C7951 Secondary malignant neoplasm of bone: Secondary | ICD-10-CM | POA: Diagnosis not present

## 2015-08-24 DIAGNOSIS — Z923 Personal history of irradiation: Secondary | ICD-10-CM

## 2015-08-24 DIAGNOSIS — R51 Headache: Secondary | ICD-10-CM

## 2015-08-24 DIAGNOSIS — C7949 Secondary malignant neoplasm of other parts of nervous system: Secondary | ICD-10-CM | POA: Diagnosis not present

## 2015-08-24 DIAGNOSIS — R630 Anorexia: Secondary | ICD-10-CM

## 2015-08-24 DIAGNOSIS — R531 Weakness: Secondary | ICD-10-CM

## 2015-08-24 DIAGNOSIS — Z86010 Personal history of colonic polyps: Secondary | ICD-10-CM

## 2015-08-24 DIAGNOSIS — C7800 Secondary malignant neoplasm of unspecified lung: Secondary | ICD-10-CM

## 2015-08-24 DIAGNOSIS — K219 Gastro-esophageal reflux disease without esophagitis: Secondary | ICD-10-CM

## 2015-08-24 DIAGNOSIS — R410 Disorientation, unspecified: Secondary | ICD-10-CM

## 2015-08-24 DIAGNOSIS — E119 Type 2 diabetes mellitus without complications: Secondary | ICD-10-CM

## 2015-08-24 DIAGNOSIS — Z5111 Encounter for antineoplastic chemotherapy: Secondary | ICD-10-CM | POA: Diagnosis not present

## 2015-08-24 DIAGNOSIS — R509 Fever, unspecified: Secondary | ICD-10-CM

## 2015-08-24 LAB — COMPREHENSIVE METABOLIC PANEL
ALBUMIN: 3.5 g/dL (ref 3.5–5.0)
ALK PHOS: 100 U/L (ref 38–126)
ALT: 43 U/L (ref 14–54)
ANION GAP: 11 (ref 5–15)
AST: 31 U/L (ref 15–41)
BUN: 14 mg/dL (ref 6–20)
CALCIUM: 9.6 mg/dL (ref 8.9–10.3)
CHLORIDE: 88 mmol/L — AB (ref 101–111)
CO2: 29 mmol/L (ref 22–32)
Creatinine, Ser: 0.62 mg/dL (ref 0.44–1.00)
GFR calc Af Amer: >60 mL/min (ref >60)
GFR calc non Af Amer: >60 mL/min (ref >60)
Glucose, Bld: 129 mg/dL — ABNORMAL HIGH (ref 65–99)
POTASSIUM: 3.4 mmol/L — AB (ref 3.5–5.1)
SODIUM: 128 mmol/L — AB (ref 135–145)
Total Bilirubin: 2.4 mg/dL — ABNORMAL HIGH (ref 0.3–1.2)
Total Protein: 6.2 g/dL — ABNORMAL LOW (ref 6.5–8.1)

## 2015-08-24 LAB — CSF CELL COUNT WITH DIFFERENTIAL
Eosinophils, CSF: 0 %
Lymphs, CSF: 0 %
Monocyte-Macrophage-Spinal Fluid: 0 %
Other Cells, CSF: 0
RBC COUNT CSF: 48 /mm3 — AB (ref 0–3)
Segmented Neutrophils-CSF: 0 %
Tube #: 1
WBC CSF: 0 /mm3

## 2015-08-24 LAB — CBC WITH DIFFERENTIAL/PLATELET
BASOS PCT: 1 %
Basophils Absolute: 0.1 10*3/uL (ref 0–0.1)
EOS ABS: 0.2 10*3/uL (ref 0–0.7)
Eosinophils Relative: 3 %
HCT: 46.5 % (ref 35.0–47.0)
HEMOGLOBIN: 15.8 g/dL (ref 12.0–16.0)
Lymphocytes Relative: 27 %
Lymphs Abs: 2.3 10*3/uL (ref 1.0–3.6)
MCH: 29.4 pg (ref 26.0–34.0)
MCHC: 34 g/dL (ref 32.0–36.0)
MCV: 86.6 fL (ref 80.0–100.0)
MONOS PCT: 5 %
Monocytes Absolute: 0.4 10*3/uL (ref 0.2–0.9)
NEUTROS PCT: 64 %
Neutro Abs: 5.5 10*3/uL (ref 1.4–6.5)
PLATELETS: 91 10*3/uL — AB (ref 150–440)
RBC: 5.38 MIL/uL — AB (ref 3.80–5.20)
RDW: 18.1 % — ABNORMAL HIGH (ref 11.5–14.5)
WBC: 8.6 10*3/uL (ref 3.6–11.0)

## 2015-08-24 LAB — PROTEIN, CSF: Total  Protein, CSF: 17 mg/dL (ref 15–45)

## 2015-08-24 MED ORDER — DEXAMETHASONE 4 MG PO TABS: 4.0000 mg | ORAL_TABLET | Freq: Every day | ORAL | Status: DC

## 2015-08-24 MED ORDER — SODIUM CHLORIDE 0.9 % IJ SOLN
Freq: Once | INTRAMUSCULAR | Status: AC
  Administered 2015-08-24: 13:00:00 via INTRATHECAL
  Filled 2015-08-24: qty 0.48

## 2015-08-24 MED ORDER — HEPARIN SOD (PORK) LOCK FLUSH 100 UNIT/ML IV SOLN
500.0000 [IU] | Freq: Once | INTRAVENOUS | Status: AC
  Administered 2015-08-24: 500 [IU] via INTRAVENOUS
  Filled 2015-08-24: qty 5

## 2015-08-24 MED ORDER — SODIUM CHLORIDE 0.9 % IJ SOLN
10.0000 mL | Freq: Once | INTRAMUSCULAR | Status: AC
  Administered 2015-08-24: 10 mL via INTRAVENOUS
  Filled 2015-08-24: qty 10

## 2015-08-24 MED ORDER — OLANZAPINE 7.5 MG PO TABS: 7.5000 mg | ORAL_TABLET | Freq: Every day | ORAL | Status: DC

## 2015-08-24 MED ORDER — SODIUM CHLORIDE 0.9 % IV SOLN
Freq: Once | INTRAVENOUS | Status: AC
  Administered 2015-08-24: 11:00:00 via INTRAVENOUS
  Filled 2015-08-24: qty 4

## 2015-08-24 NOTE — Progress Notes (Addendum)
[place] @ [facility] [phone]) [phone]  Fax:([phone]) [place] OB: 08-May-1961  MR#: [id number]  JQZ#:[id number]  Patient Care Team: Pcp Not In System as PCP - General Robert Bellow, MD (General Surgery)  CHIEF COMPLAINT:  Chief Complaint  Patient presents with  . OTHER    Chief Complaint/Problem List  1.  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 Status post Cytoxan, Adriamycin, followed by high dose chemotherapy and stem cell support.  Status post radiation therapy. 1996 2.  Was on Tamoxifen therapy.  3.  Progressive disease with the left supraclavicular lymph node positive on needle aspiration.  4.  Tamoxifen has been discontinued.  5.  Ovarian ablation with Lupron, Femara and radiation to the ovaries starting in December 2003. 6.  On Femara  7.  Rising tumor markers.  CT scan is positive for liver metastases.  And lung metastasesBiopsies positive for metastatic disease (liver biopsy) consistent with breast primary. November, 2014 [place] and progesterone receptor positive.  HER-2 receptor negative.. 8.  Abraxene starting  from September 03, 2013.  9.Patient was taken off Abraxane because of progressive side effect and started on ERIBULIN April 08, 2014    10.  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, 2016) Patient started on intrathecal methotrexate October 2 016    Oncology Flowsheet 06/26/2015 07/13/2015 07/14/2015 08/03/2015 08/10/2015 08/17/2015 08/24/2015  Day, Cycle - - - Day 1, Cycle 1 cycle 2 , Cycle 1 Day 1, Cycle 2 cycle 2 , Cycle 2  dexamethasone (DECADRON) IV - [ 8 mg ] - [ 10 mg ] [ 10 mg ] [ 10 mg ] [ 10 mg ]  eriBULin mesylate (HALAVEN) IV - - - - - - -  LORazepam (ATIVAN) IV - - - - - - -  LORazepam (ATIVAN) PO - - 1 mg - - - -  methotrexate (PF) IT - - - [ 12 mg ] [ 12 mg ] [ 12 mg ] [ 12 mg ]  ondansetron (ZOFRAN) IV - [ 8 mg ] - [  8 mg ] [ 8 mg ] [ 8 mg ] [ 8 mg ]  pegfilgrastim (NEULASTA ONPRO KIT) [place] - - - - - - -  pegfilgrastim (NEULASTA) [place] - - - - - - -  prochlorperazine (COMPAZINE) IV 10 mg - - - - - -    INTERVAL HISTORY: 54 year old lady who has a 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  Patient is getting intrathecal chemotherapy Complains of persistent nausea. Confusion off and on. Patient is almost bedridden but according to family health.  Off now being more alert than before Discussion regarding continuation of treatment       REVIEW OF SYSTEMS:   GENERAL:  Patient is feeling weak and tired.  Frequent fall Low-grade fever.  Aches and pains PERFORMANCE STATUS (ECOG):  2 HEENT:  No visual changes, runny nose, sore throat, mouth sores or tenderness. Lungs: No shortness of breath or cough.  No hemoptysis. Cardiac:  No chest pain, palpitations, orthopnea, or PND. GI: intermittent  Nausea and vomiting. He should not complaints of nausea and vomiting for last 2 days.  Epigastric discomfort.  Taking Nexium once a day GU:  No urgency, frequency, dysuria, or hematuria.June 09, 2015 Musculoskeletal:  No back pain.  No joint pain.  No muscle tenderness. Extremities:  No pain or swelling. Skin:  No rashes or skin changes. Neuro:  No headache, numbness or weakness, balance or coordination issues. Endocrine:  No diabetes, thyroid issues, hot flashes or night sweats. Psych:  No mood changes, depression or anxiety. Pain: Neck pain Review of systems:  All other systems reviewed and found to be negative. Epigastric discomfort which is persistent dull aching As per HPI. Otherwise, a complete review of systems is negatve.  PAST MEDICAL HISTORY: Past Medical History  Diagnosis Date  . Arthritis   . Diabetes mellitus without complication ([place])   . Colon polyps   . GERD (gastroesophageal reflux disease)   . Cancer ([place]) 2002     LEFT MASTECTOMY  . Breast cancer, female  ([place]) 1997  . Shortness of breath dyspnea   . Headache     PAST SURGICAL HISTORY: Past Surgical History  Procedure Laterality Date  . Mastectomy Left 2002  . Colonoscopy  [id number]    DR.BYRNETT  . Foot surgery Right   . Gastric endoscopy   2002  . Bone marrow transplant    . Cholecystectomy  1995  . Sigmoidoscopy  2013    FAMILY HISTORY Family History  Problem Relation Age of Onset  . Colon cancer Sister   . COPD Mother       ADVANCED DIRECTIVES:  Patient does   Not have advanced health care directive Patient does not have any advanced healthcare directive. Information has been given.     HEALTH MAINTENANCE: Social History  Substance Use Topics  . Smoking status: Never Smoker   . Smokeless tobacco: Never Used  . Alcohol Use: No      Allergies  Allergen Reactions  . Codeine Hives and Nausea Only    Other Reaction: GI Upset  . Latex Itching and Hives  . Shellfish Allergy Swelling    Lips    Current Outpatient Prescriptions  Medication Sig Dispense Refill  . Calcium Carb-Cholecalciferol 600-400 MG-UNIT TABS Take by mouth.    . calcium carbonate (OS-CAL - DOSED IN MG OF ELEMENTAL CALCIUM) 1250 MG tablet Take 1 tablet by mouth daily.    Marland Kitchen dexamethasone (DECADRON) 4 MG tablet Take 1 tablet (4 mg total) by mouth daily. Take 2 tabs PO daily x 15 days, then take 1 tab daily x 15 days 45 tablet 0  . esomeprazole (NEXIUM) 40 MG capsule Take 40 mg by mouth 2 (two) times daily before a meal.     . fentaNYL (DURAGESIC - DOSED MCG/HR) 25 MCG/HR patch Place 1 patch (25 mcg total) onto the skin every 3 (three) days. 10 patch 0  . ferrous sulfate 325 (65 FE) MG tablet Take by mouth.    . furosemide (LASIX) 20 MG tablet TAKE 1 TABLET EVERY DAY 30 tablet 4  . glipiZIDE (GLUCOTROL) 5 MG tablet TAKE 1 TABLET BY MOUTH 30 MINUTES BEFORE A MEAL TWICE A DAY 60 tablet 1  . KLOR-CON 10 10 MEQ tablet TAKE 1 TABLET (10 MEQ TOTAL) BY MOUTH 2 (TWO) TIMES DAILY. 60 tablet 3  .  lactulose (CHRONULAC) 10 GM/15ML solution Take 15 mLs (10 g total) by mouth 2 (two) times daily as needed for mild constipation. 240 mL 0  . lidocaine-prilocaine (EMLA) cream   3  . loratadine (CLARITIN) 10 MG tablet Take 10 mg by mouth daily.    Marland Kitchen LORazepam (ATIVAN) 0.5 MG tablet Take 1 tablet (0.5 mg total) by mouth 3 (three) times daily as needed for anxiety. 90 tablet 3  . meloxicam (MOBIC) 15 MG tablet Take  15 mg by mouth daily.    Marland Kitchen OLANZapine (ZYPREXA) 7.5 MG tablet Take 1 tablet (7.5 mg total) by mouth at bedtime. 30 tablet 0  . ondansetron (ZOFRAN) 4 MG tablet TAKE 1 TABLET (4 MG TOTAL) BY MOUTH EVERY 6 (SIX) HOURS AS NEEDED FOR NAUSEA OR VOMITING. 30 tablet 3  . oxyCODONE-acetaminophen (PERCOCET) 10-325 MG tablet Take 1 tablet by mouth every 4 (four) hours as needed for pain. 60 tablet 0  . PARoxetine (PAXIL) 40 MG tablet TAKE 1 TABLET ONCE DAILY 30 tablet 3  . polyethylene glycol powder (GLYCOLAX/MIRALAX) powder 255 grams one bottle for colonoscopy prep 255 g 0  . promethazine (PHENERGAN) 25 MG suppository Place 1 suppository (25 mg total) rectally every 6 (six) hours as needed for nausea. 12 suppository 1  . spironolactone (ALDACTONE) 100 MG tablet Take 1 tablet (100 mg total) by mouth daily. 30 tablet 6  . sulfamethoxazole-trimethoprim (BACTRIM DS,SEPTRA DS) 800-160 MG per tablet Take 1 tablet by mouth 2 (two) times daily. 4 tablet 0  . vitamin E 400 UNIT capsule Take 400 Units by mouth daily.    . Ascorbic Acid (VITAMIN C) 1000 MG tablet Take 1,000 mg by mouth daily.     No current facility-administered medications for this visit.   Facility-Administered Medications Ordered in Other Visits  Medication Dose Route Frequency Provider Last Rate Last Dose  . sodium chloride 0.9 % injection 10 mL  10 mL Intracatheter PRN Forest Gleason, MD   10 mL at 02/18/15 1453  . sodium chloride 0.9 % injection 10 mL  10 mL Intracatheter PRN Forest Gleason, MD   10 mL at 08/10/15 1400     OBJECTIVE:  Filed Vitals:   08/24/15 1313  BP: 108/77  Pulse: 129  Temp: 95.4 F (35.2 C)     There is no weight on file to calculate BMI.    ECOG FS:2 - Symptomatic, <50% confined to bed  PHYSICAL EXAM: GENERAL:  Well developed, well nourished, sitting comfortably in the exam room in no acute distress. MENTAL STATUS:  Alert and oriented to person, place and time. HEAD:alopecia i.  Normocephalic, atraumatic, face symmetric, no Cushingoid features. EYES:  Pupils equal round and reactive to light and accomodation.  No conjunctivitis or scleral icterus. ENT:  Oropharynx clear without lesion.  Tongue normal. Mucous membranes moist.  RESPIRATORY:  Clear to auscultation without rales, wheezes or rhonchi. CARDIOVASCULAR:  Regular rate and rhythm without murmur, rub or gallop. Abdomen: Tenderness in epigastric area.  Liver is not palpable.  No ascites. Oh palpable mass BACK:  No CVA tenderness.  No tenderness on percussion of the back or rib cage. SKIN:  No rashes, ulcers or lesions. EXTREMITIES: No edema, no skin discoloration or tenderness.  No palpable cords. LYMPH NODES: No palpable cervical, supraclavicular, axillary or inguinal adenopathy  NEUROLOGICAL: Unremarkable. PSYCH:  Appropriate. Neurological system: 6 nerve  palsy on the right side.  Possibility of right 7 nerve  Palsy Weakness in both lower extremity.  Complete examination is difficult.                    LAB RESULTS:  Infusion on 08/24/2015  Component Date Value Ref Range Status  . WBC 08/24/2015 8.6  3.6 - 11.0 K/uL Final  . RBC 08/24/2015 5.38* 3.80 - 5.20 MIL/uL Final  . Hemoglobin 08/24/2015 15.8  12.0 - 16.0 g/dL Final  . HCT 08/24/2015 46.5  35.0 - 47.0 % Final  . MCV 08/24/2015 86.6  80.0 -  100.0 fL Final  . MCH 08/24/2015 29.4  26.0 - 34.0 pg Final  . MCHC 08/24/2015 34.0  32.0 - 36.0 g/dL Final  . RDW 08/24/2015 18.1* 11.5 - 14.5 % Final  . Platelets 08/24/2015 91* 150 - 440 K/uL Final  . Neutrophils  Relative % 08/24/2015 64   Final  . Neutro Abs 08/24/2015 5.5  1.4 - 6.5 K/uL Final  . Lymphocytes Relative 08/24/2015 27   Final  . Lymphs Abs 08/24/2015 2.3  1.0 - 3.6 K/uL Final  . Monocytes Relative 08/24/2015 5   Final  . Monocytes Absolute 08/24/2015 0.4  0.2 - 0.9 K/uL Final  . Eosinophils Relative 08/24/2015 3   Final  . Eosinophils Absolute 08/24/2015 0.2  0 - 0.7 K/uL Final  . Basophils Relative 08/24/2015 1   Final  . Basophils Absolute 08/24/2015 0.1  0 - 0.1 K/uL Final  . Sodium 08/24/2015 128* 135 - 145 mmol/L Final  . Potassium 08/24/2015 3.4* 3.5 - 5.1 mmol/L Final  . Chloride 08/24/2015 88* 101 - 111 mmol/L Final  . CO2 08/24/2015 29  22 - 32 mmol/L Final  . Glucose, Bld 08/24/2015 129* 65 - 99 mg/dL Final  . BUN 08/24/2015 14  6 - 20 mg/dL Final  . Creatinine, Ser 08/24/2015 0.62  0.44 - 1.00 mg/dL Final  . Calcium 08/24/2015 9.6  8.9 - 10.3 mg/dL Final  . Total Protein 08/24/2015 6.2* 6.5 - 8.1 g/dL Final  . Albumin 08/24/2015 3.5  3.5 - 5.0 g/dL Final  . AST 08/24/2015 31  15 - 41 U/L Final  . ALT 08/24/2015 43  14 - 54 U/L Final  . Alkaline Phosphatase 08/24/2015 100  38 - 126 U/L Final  . Total Bilirubin 08/24/2015 2.4* 0.3 - 1.2 mg/dL Final  . GFR calc non Af Amer 08/24/2015 >60  >60 mL/min Final  . GFR calc Af Amer 08/24/2015 >60  >60 mL/min Final   Comment: (NOTE) The eGFR has been calculated using the CKD EPI equation. This calculation has not been validated in all clinical situations. eGFR's persistently <60 mL/min signify possible Chronic Kidney Disease.   . Anion gap 08/24/2015 11  5 - 15 Final    Lab Results  Component Value Date   LABCA2 26.0 08/03/2015   .   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 Continue intrathecal chemotherapy Regarding persistent nausea we have added Zyprexa 7.5 mg by mouth at bedtime And added Decadron 8 mg for 2 weeks followed by 4 mg for 2 weeks Procedure note omaya reservoir site has  been cleaned with Betadine and later on with alcohol With Huber needle 10 cc of CSF was removed.  It was clear. 12 mg of methotrexate was injected intrathecally Patient had received Decadron and Zofran prior to administration CSF was sent for further evaluation Reevaluate patient in 2 weeks patient will be given Thanksgiving week off from treatment and then reassessment if there is no significant improvement possibility of hospice and palliative care would be discussed. Is and can be started on anti-hormonal therapy  This was discussed with the patient's family  The patient is in need of a gel overlay due to limited mobility as well as impaired nutritional status and incontinence. Order to be faxed to [facility].    Breast cancer metastasized to liver   Staging form: Breast, AJCC 7th Edition     Clinical: Stage IV (T2, N3, M1) - Signed by Forest Gleason, MD on 02/18/2015   Forest Gleason, MD  08/24/2015 2:54 PM

## 2015-09-08 ENCOUNTER — Inpatient Hospital Stay: Payer: Managed Care, Other (non HMO)

## 2015-09-08 ENCOUNTER — Inpatient Hospital Stay (HOSPITAL_BASED_OUTPATIENT_CLINIC_OR_DEPARTMENT_OTHER): Payer: Managed Care, Other (non HMO) | Admitting: Oncology

## 2015-09-08 VITALS — BP 101/80 | HR 118 | Temp 96.5°F | Wt 168.2 lb

## 2015-09-08 DIAGNOSIS — R5383 Other fatigue: Secondary | ICD-10-CM

## 2015-09-08 DIAGNOSIS — E119 Type 2 diabetes mellitus without complications: Secondary | ICD-10-CM

## 2015-09-08 DIAGNOSIS — C787 Secondary malignant neoplasm of liver and intrahepatic bile duct: Secondary | ICD-10-CM

## 2015-09-08 DIAGNOSIS — R509 Fever, unspecified: Secondary | ICD-10-CM

## 2015-09-08 DIAGNOSIS — Z923 Personal history of irradiation: Secondary | ICD-10-CM

## 2015-09-08 DIAGNOSIS — C7951 Secondary malignant neoplasm of bone: Secondary | ICD-10-CM

## 2015-09-08 DIAGNOSIS — R410 Disorientation, unspecified: Secondary | ICD-10-CM

## 2015-09-08 DIAGNOSIS — R630 Anorexia: Secondary | ICD-10-CM

## 2015-09-08 DIAGNOSIS — Z86010 Personal history of colonic polyps: Secondary | ICD-10-CM

## 2015-09-08 DIAGNOSIS — H4921 Sixth [abducent] nerve palsy, right eye: Secondary | ICD-10-CM

## 2015-09-08 DIAGNOSIS — R110 Nausea: Secondary | ICD-10-CM

## 2015-09-08 DIAGNOSIS — R531 Weakness: Secondary | ICD-10-CM

## 2015-09-08 DIAGNOSIS — R51 Headache: Secondary | ICD-10-CM

## 2015-09-08 DIAGNOSIS — Z7984 Long term (current) use of oral hypoglycemic drugs: Secondary | ICD-10-CM

## 2015-09-08 DIAGNOSIS — Z5111 Encounter for antineoplastic chemotherapy: Secondary | ICD-10-CM | POA: Diagnosis not present

## 2015-09-08 DIAGNOSIS — R0602 Shortness of breath: Secondary | ICD-10-CM

## 2015-09-08 DIAGNOSIS — C7932 Secondary malignant neoplasm of cerebral meninges: Secondary | ICD-10-CM | POA: Diagnosis not present

## 2015-09-08 DIAGNOSIS — K219 Gastro-esophageal reflux disease without esophagitis: Secondary | ICD-10-CM

## 2015-09-08 DIAGNOSIS — C779 Secondary and unspecified malignant neoplasm of lymph node, unspecified: Secondary | ICD-10-CM

## 2015-09-08 DIAGNOSIS — Z79899 Other long term (current) drug therapy: Secondary | ICD-10-CM

## 2015-09-08 DIAGNOSIS — C50919 Malignant neoplasm of unspecified site of unspecified female breast: Secondary | ICD-10-CM | POA: Diagnosis not present

## 2015-09-08 DIAGNOSIS — C7800 Secondary malignant neoplasm of unspecified lung: Secondary | ICD-10-CM

## 2015-09-08 DIAGNOSIS — C7949 Secondary malignant neoplasm of other parts of nervous system: Secondary | ICD-10-CM | POA: Diagnosis not present

## 2015-09-08 LAB — CBC WITH DIFFERENTIAL/PLATELET
Basophils Absolute: 0.1 10*3/uL (ref 0–0.1)
Basophils Relative: 1 %
Eosinophils Absolute: 0.1 10*3/uL (ref 0–0.7)
Eosinophils Relative: 1 %
HEMATOCRIT: 43.9 % (ref 35.0–47.0)
HEMOGLOBIN: 14.9 g/dL (ref 12.0–16.0)
LYMPHS ABS: 2 10*3/uL (ref 1.0–3.6)
LYMPHS PCT: 22 %
MCH: 30.1 pg (ref 26.0–34.0)
MCHC: 33.8 g/dL (ref 32.0–36.0)
MCV: 88.9 fL (ref 80.0–100.0)
MONO ABS: 0.6 10*3/uL (ref 0.2–0.9)
MONOS PCT: 7 %
NEUTROS ABS: 6.3 10*3/uL (ref 1.4–6.5)
Neutrophils Relative %: 69 %
Platelets: 107 10*3/uL — ABNORMAL LOW (ref 150–440)
RBC: 4.94 MIL/uL (ref 3.80–5.20)
RDW: 19.5 % — AB (ref 11.5–14.5)
WBC: 9.1 10*3/uL (ref 3.6–11.0)

## 2015-09-08 LAB — COMPREHENSIVE METABOLIC PANEL
ALK PHOS: 85 U/L (ref 38–126)
ALT: 24 U/L (ref 14–54)
ANION GAP: 11 (ref 5–15)
AST: 19 U/L (ref 15–41)
Albumin: 3.3 g/dL — ABNORMAL LOW (ref 3.5–5.0)
BILIRUBIN TOTAL: 1.7 mg/dL — AB (ref 0.3–1.2)
BUN: 9 mg/dL (ref 6–20)
CALCIUM: 9.1 mg/dL (ref 8.9–10.3)
CO2: 29 mmol/L (ref 22–32)
Chloride: 90 mmol/L — ABNORMAL LOW (ref 101–111)
Creatinine, Ser: 0.47 mg/dL (ref 0.44–1.00)
GFR calc Af Amer: >60 mL/min (ref >60)
Glucose, Bld: 153 mg/dL — ABNORMAL HIGH (ref 65–99)
POTASSIUM: 3.1 mmol/L — AB (ref 3.5–5.1)
Sodium: 130 mmol/L — ABNORMAL LOW (ref 135–145)
TOTAL PROTEIN: 5.7 g/dL — AB (ref 6.5–8.1)

## 2015-09-08 MED ORDER — FENTANYL 25 MCG/HR TD PT72: 25.0000 ug | MEDICATED_PATCH | TRANSDERMAL | Status: DC

## 2015-09-08 NOTE — Progress Notes (Signed)
Patient is constipated.  Also husband states she has thrown up 3 times this morning.

## 2015-09-10 ENCOUNTER — Inpatient Hospital Stay: Payer: Managed Care, Other (non HMO)

## 2015-09-10 ENCOUNTER — Inpatient Hospital Stay (HOSPITAL_BASED_OUTPATIENT_CLINIC_OR_DEPARTMENT_OTHER): Payer: Managed Care, Other (non HMO) | Admitting: Oncology

## 2015-09-10 ENCOUNTER — Inpatient Hospital Stay: Payer: Managed Care, Other (non HMO) | Attending: Oncology

## 2015-09-10 DIAGNOSIS — R5383 Other fatigue: Secondary | ICD-10-CM

## 2015-09-10 DIAGNOSIS — Z86010 Personal history of colonic polyps: Secondary | ICD-10-CM | POA: Insufficient documentation

## 2015-09-10 DIAGNOSIS — C50912 Malignant neoplasm of unspecified site of left female breast: Secondary | ICD-10-CM | POA: Insufficient documentation

## 2015-09-10 DIAGNOSIS — Z170 Estrogen receptor positive status [ER+]: Secondary | ICD-10-CM | POA: Diagnosis not present

## 2015-09-10 DIAGNOSIS — E119 Type 2 diabetes mellitus without complications: Secondary | ICD-10-CM | POA: Diagnosis not present

## 2015-09-10 DIAGNOSIS — M129 Arthropathy, unspecified: Secondary | ICD-10-CM

## 2015-09-10 DIAGNOSIS — Z79899 Other long term (current) drug therapy: Secondary | ICD-10-CM | POA: Diagnosis not present

## 2015-09-10 DIAGNOSIS — C7949 Secondary malignant neoplasm of other parts of nervous system: Secondary | ICD-10-CM | POA: Insufficient documentation

## 2015-09-10 DIAGNOSIS — Z9012 Acquired absence of left breast and nipple: Secondary | ICD-10-CM | POA: Diagnosis not present

## 2015-09-10 DIAGNOSIS — C7800 Secondary malignant neoplasm of unspecified lung: Secondary | ICD-10-CM | POA: Insufficient documentation

## 2015-09-10 DIAGNOSIS — R531 Weakness: Secondary | ICD-10-CM | POA: Diagnosis not present

## 2015-09-10 DIAGNOSIS — C779 Secondary and unspecified malignant neoplasm of lymph node, unspecified: Secondary | ICD-10-CM | POA: Insufficient documentation

## 2015-09-10 DIAGNOSIS — C787 Secondary malignant neoplasm of liver and intrahepatic bile duct: Secondary | ICD-10-CM

## 2015-09-10 DIAGNOSIS — C7951 Secondary malignant neoplasm of bone: Secondary | ICD-10-CM | POA: Diagnosis not present

## 2015-09-10 DIAGNOSIS — Z923 Personal history of irradiation: Secondary | ICD-10-CM | POA: Diagnosis not present

## 2015-09-10 DIAGNOSIS — Z7984 Long term (current) use of oral hypoglycemic drugs: Secondary | ICD-10-CM | POA: Diagnosis not present

## 2015-09-10 DIAGNOSIS — Z800 Family history of malignant neoplasm of digestive organs: Secondary | ICD-10-CM | POA: Diagnosis not present

## 2015-09-10 DIAGNOSIS — Z9181 History of falling: Secondary | ICD-10-CM | POA: Insufficient documentation

## 2015-09-10 DIAGNOSIS — R110 Nausea: Secondary | ICD-10-CM | POA: Diagnosis not present

## 2015-09-10 DIAGNOSIS — C7932 Secondary malignant neoplasm of cerebral meninges: Secondary | ICD-10-CM

## 2015-09-10 DIAGNOSIS — K219 Gastro-esophageal reflux disease without esophagitis: Secondary | ICD-10-CM

## 2015-09-10 DIAGNOSIS — R1011 Right upper quadrant pain: Secondary | ICD-10-CM | POA: Insufficient documentation

## 2015-09-10 DIAGNOSIS — C50919 Malignant neoplasm of unspecified site of unspecified female breast: Secondary | ICD-10-CM

## 2015-09-10 DIAGNOSIS — Z5111 Encounter for antineoplastic chemotherapy: Secondary | ICD-10-CM | POA: Diagnosis present

## 2015-09-10 LAB — PROTEIN, CSF: TOTAL PROTEIN, CSF: 7 mg/dL — AB (ref 15–45)

## 2015-09-10 LAB — CSF CELL COUNT WITH DIFFERENTIAL
RBC Count, CSF: 15 /mm3 — ABNORMAL HIGH (ref 0–3)
WBC, CSF: 0 /mm3

## 2015-09-10 MED ORDER — HEPARIN SOD (PORK) LOCK FLUSH 100 UNIT/ML IV SOLN: 500.0000 [IU] | Freq: Once | INTRAVENOUS | Status: AC

## 2015-09-10 MED ORDER — HEPARIN SOD (PORK) LOCK FLUSH 100 UNIT/ML IV SOLN
500.0000 [IU] | Freq: Once | INTRAVENOUS | Status: AC
  Administered 2015-09-10: 500 [IU] via INTRAVENOUS

## 2015-09-10 MED ORDER — SODIUM CHLORIDE 0.9 % IJ SOLN
Freq: Once | INTRAMUSCULAR | Status: AC
  Administered 2015-09-10: 12:00:00 via INTRATHECAL
  Filled 2015-09-10: qty 0.48

## 2015-09-10 MED ORDER — SODIUM CHLORIDE 0.9 % IJ SOLN
10.0000 mL | Freq: Once | INTRAMUSCULAR | Status: AC
  Administered 2015-09-10: 10 mL via INTRAVENOUS
  Filled 2015-09-10: qty 10

## 2015-09-10 MED ORDER — SODIUM CHLORIDE 0.9 % IV SOLN
Freq: Once | INTRAVENOUS | Status: AC
  Administered 2015-09-10: 10:00:00 via INTRAVENOUS
  Filled 2015-09-10: qty 4

## 2015-09-10 MED ORDER — SODIUM CHLORIDE 0.9 % IV SOLN
INTRAVENOUS | Status: AC
  Administered 2015-09-10: 10:00:00 via INTRAVENOUS
  Filled 2015-09-10: qty 1000

## 2015-09-11 ENCOUNTER — Encounter: Payer: Self-pay | Admitting: Oncology

## 2015-09-11 NOTE — Progress Notes (Signed)
[facility] @ [facility] [phone]  Fax:([phone] Lenora OB: Mar 05, 1961  MR#: [id number]  IAX#:[id number]  Patient Care Team: Pcp Not In System as PCP - General Robert Bellow, MD (General Surgery)  CHIEF COMPLAINT:  No chief complaint on file.   Chief Complaint/Problem List  1.  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 Status post Cytoxan, Adriamycin, followed by high dose chemotherapy and stem cell support.  Status post radiation therapy. 1996 2.  Was on Tamoxifen therapy.  3.  Progressive disease with the left supraclavicular lymph node positive on needle aspiration.  4.  Tamoxifen has been discontinued.  5.  Ovarian ablation with Lupron, Femara and radiation to the ovaries starting in December 2003. 6.  On Femara  7.  Rising tumor markers.  CT scan is positive for liver metastases.  And lung metastasesBiopsies positive for metastatic disease (liver biopsy) consistent with breast primary. November, 2014 [place] and progesterone receptor positive.  HER-2 receptor negative.. 8.  Abraxene starting  from September 03, 2013.  9.Patient was taken off Abraxane because of progressive side effect and started on ERIBULIN April 08, 2014    10.  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, 2016) Patient started on intrathecal methotrexate October 2 016    Oncology Flowsheet 07/13/2015 07/14/2015 08/03/2015 08/10/2015 08/17/2015 08/24/2015 09/10/2015  Day, Cycle - - Day 1, Cycle 1 cycle 2 , Cycle 1 Day 1, Cycle 2 cycle 2 , Cycle 2 Day 1, Cycle 3  dexamethasone (DECADRON) IV [ 8 mg ] - [ 10 mg ] [ 10 mg ] [ 10 mg ] [ 10 mg ] [ 10 mg ]  eriBULin mesylate (HALAVEN) IV - - - - - - -  LORazepam (ATIVAN) IV - - - - - - -  LORazepam (ATIVAN) PO - 1 mg - - - - -  methotrexate (PF) IT - - [ 12 mg ] [ 12 mg ] [ 12 mg ] [ 12 mg ] [ 12 mg ]  ondansetron (ZOFRAN) IV [ 8 mg ]  - [ 8 mg ] [ 8 mg ] [ 8 mg ] [ 8 mg ] [ 8 mg ]  pegfilgrastim (NEULASTA ONPRO KIT) [place] - - - - - - -  pegfilgrastim (NEULASTA) [place] - - - - - - -  prochlorperazine (COMPAZINE) IV - - - - - - -    INTERVAL HISTORY: 54 year old lady who has a 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  Patient is getting intrathecal chemotherapy  Patient's general condition has improved.  Patient can move right lower extremity but still has a difficulty lifting her left lower extremity.  According to family patient is much more alert. Headache is improved nausea is improved Patient is here for ongoing evaluation and continuation of intrathecal chemotherapy       REVIEW OF SYSTEMS:   GENERAL:  Patient is feeling weak and tired.  Frequent fall Low-grade fever.  Aches and pains PERFORMANCE STATUS (ECOG):  2 HEENT:  No visual changes, runny nose, sore throat, mouth sores or tenderness. Lungs: No shortness of breath or cough.  No hemoptysis. Cardiac:  No chest pain, palpitations, orthopnea, or PND. GI: intermittent  Nausea and vomiting. He should not complaints of nausea and vomiting for last 2 days.  Epigastric discomfort.  Taking Nexium once a day GU:  No urgency, frequency,  dysuria, or hematuria.June 09, 2015 Musculoskeletal:  No back pain.  No joint pain.  No muscle tenderness. Extremities:  No pain or swelling. Skin:  No rashes or skin changes. Neuro:  No headache, numbness or weakness, balance or coordination issues. Endocrine:  No diabetes, thyroid issues, hot flashes or night sweats. Psych:  No mood changes, depression or anxiety. Pain: Neck pain Review of systems:  All other systems reviewed and found to be negative. Epigastric discomfort which is persistent dull aching As per HPI. Otherwise, a complete review of systems is negatve.  PAST MEDICAL HISTORY: Past Medical History  Diagnosis Date  . Arthritis   . Diabetes mellitus without complication ([place])   .  Colon polyps   . GERD (gastroesophageal reflux disease)   . Cancer ([place]) 2002     LEFT MASTECTOMY  . Breast cancer, female ([place]) 1997  . Shortness of breath dyspnea   . Headache     PAST SURGICAL HISTORY: Past Surgical History  Procedure Laterality Date  . Mastectomy Left 2002  . Colonoscopy  [id number]    [email or web address]  . Foot surgery Right   . Gastric endoscopy   2002  . Bone marrow transplant    . Cholecystectomy  1995  . Sigmoidoscopy  2013    FAMILY HISTORY Family History  Problem Relation Age of Onset  . Colon cancer Sister   . COPD Mother       ADVANCED DIRECTIVES:  Patient does   Not have advanced health care directive Patient does not have any advanced healthcare directive. Information has been given.     HEALTH MAINTENANCE: Social History  Substance Use Topics  . Smoking status: Never Smoker   . Smokeless tobacco: Never Used  . Alcohol Use: No      Allergies  Allergen Reactions  . Codeine Hives and Nausea Only    Other Reaction: GI Upset  . Latex Itching and Hives  . Shellfish Allergy Swelling    Lips    Current Outpatient Prescriptions  Medication Sig Dispense Refill  . Ascorbic Acid (VITAMIN C) 1000 MG tablet Take 1,000 mg by mouth daily.    . Calcium Carb-Cholecalciferol 600-400 MG-UNIT TABS Take by mouth.    . calcium carbonate (OS-CAL - DOSED IN MG OF ELEMENTAL CALCIUM) 1250 MG tablet Take 1 tablet by mouth daily.    Marland Kitchen dexamethasone (DECADRON) 4 MG tablet Take 1 tablet (4 mg total) by mouth daily. Take 2 tabs PO daily x 15 days, then take 1 tab daily x 15 days 45 tablet 0  . esomeprazole (NEXIUM) 40 MG capsule Take 40 mg by mouth 2 (two) times daily before a meal.     . fentaNYL (DURAGESIC - DOSED MCG/HR) 25 MCG/HR patch Place 1 patch (25 mcg total) onto the skin every 3 (three) days. 10 patch 0  . ferrous sulfate 325 (65 FE) MG tablet Take by mouth.    . furosemide (LASIX) 20 MG tablet TAKE 1 TABLET EVERY DAY 30 tablet 4  . glipiZIDE  (GLUCOTROL) 5 MG tablet TAKE 1 TABLET BY MOUTH 30 MINUTES BEFORE A MEAL TWICE A DAY 60 tablet 1  . KLOR-CON 10 10 MEQ tablet TAKE 1 TABLET (10 MEQ TOTAL) BY MOUTH 2 (TWO) TIMES DAILY. 60 tablet 3  . lactulose (CHRONULAC) 10 GM/15ML solution Take 15 mLs (10 g total) by mouth 2 (two) times daily as needed for mild constipation. 240 mL 0  . lidocaine-prilocaine (EMLA) cream   3  . loratadine (CLARITIN) 10 MG  tablet Take 10 mg by mouth daily.    Marland Kitchen LORazepam (ATIVAN) 0.5 MG tablet Take 1 tablet (0.5 mg total) by mouth 3 (three) times daily as needed for anxiety. 90 tablet 3  . meloxicam (MOBIC) 15 MG tablet Take 15 mg by mouth daily.    Marland Kitchen OLANZapine (ZYPREXA) 7.5 MG tablet Take 1 tablet (7.5 mg total) by mouth at bedtime. 30 tablet 0  . ondansetron (ZOFRAN) 4 MG tablet TAKE 1 TABLET (4 MG TOTAL) BY MOUTH EVERY 6 (SIX) HOURS AS NEEDED FOR NAUSEA OR VOMITING. 30 tablet 3  . oxyCODONE-acetaminophen (PERCOCET) 10-325 MG tablet Take 1 tablet by mouth every 4 (four) hours as needed for pain. 60 tablet 0  . PARoxetine (PAXIL) 40 MG tablet TAKE 1 TABLET ONCE DAILY 30 tablet 3  . polyethylene glycol powder (GLYCOLAX/MIRALAX) powder 255 grams one bottle for colonoscopy prep 255 g 0  . promethazine (PHENERGAN) 25 MG suppository Place 1 suppository (25 mg total) rectally every 6 (six) hours as needed for nausea. 12 suppository 1  . spironolactone (ALDACTONE) 100 MG tablet Take 1 tablet (100 mg total) by mouth daily. 30 tablet 6  . sulfamethoxazole-trimethoprim (BACTRIM DS,SEPTRA DS) 800-160 MG per tablet Take 1 tablet by mouth 2 (two) times daily. 4 tablet 0  . vitamin E 400 UNIT capsule Take 400 Units by mouth daily.     No current facility-administered medications for this visit.   Facility-Administered Medications Ordered in Other Visits  Medication Dose Route Frequency Provider Last Rate Last Dose  . 0.9 %  sodium chloride infusion   Intravenous Continuous Forest Gleason, MD 10 mL/hr at 09/10/15 0940    .  heparin lock flush 100 unit/mL  500 Units Intravenous Once Forest Gleason, MD   500 Units at 09/10/15 1254  . sodium chloride 0.9 % injection 10 mL  10 mL Intracatheter PRN Forest Gleason, MD   10 mL at 02/18/15 1453  . sodium chloride 0.9 % injection 10 mL  10 mL Intracatheter PRN Forest Gleason, MD   10 mL at 08/10/15 1400    OBJECTIVE:  There were no vitals filed for this visit.   There is no weight on file to calculate BMI.    ECOG FS:2 - Symptomatic, <50% confined to bed  PHYSICAL EXAM: GENERAL:  Patient is in wheelchair. MENTAL STATUS: cc slightly more alert oriented and conversational. HEAD:alopecia i.  Normocephalic, atraumatic, face symmetric, no Cushingoid features. EYES:  Pupils equal round and reactive to light and accomodation.  No conjunctivitis or scleral icterus. ENT:  Oropharynx clear without lesion.  Tongue normal. Mucous membranes moist.  RESPIRATORY:  Clear to auscultation without rales, wheezes or rhonchi. CARDIOVASCULAR:  Regular rate and rhythm without murmur, rub or gallop. Abdomen: Tenderness in epigastric area.  Liver is not palpable.  No ascites. Oh palpable mass BACK:  No CVA tenderness.  No tenderness on percussion of the back or rib cage. SKIN:  No rashes, ulcers or lesions. EXTREMITIES: No edema, no skin discoloration or tenderness.  No palpable cords. LYMPH NODES: No palpable cervical, supraclavicular, axillary or inguinal adenopathy  NEUROLOGICAL:higher functions improvement.  Weakness in both lower extremity.  PSYCH:  Appropriate. Neurological system: 6 nerve  palsy on the right side.  Possibility of right 7 nerve  Palsy Weakness in both lower extremity.  Complete examination is difficult.                    LAB RESULTS:  Office Visit on 09/10/2015  Component Date  Value Ref Range Status  . Total  Protein, CSF 09/10/2015 7* 15 - 45 mg/dL Final  . Tube # 09/10/2015 CUP   Final  . Color, CSF 09/10/2015 COLORLESS  COLORLESS Final  . Appearance, CSF  09/10/2015 CLEAR* CLEAR Final  . RBC Count, CSF 09/10/2015 15* 0 - 3 /cu mm Final  . WBC, CSF 09/10/2015 0   Final  Appointment on 09/08/2015  Component Date Value Ref Range Status  . WBC 09/08/2015 9.1  3.6 - 11.0 K/uL Final  . RBC 09/08/2015 4.94  3.80 - 5.20 MIL/uL Final  . Hemoglobin 09/08/2015 14.9  12.0 - 16.0 g/dL Final  . HCT 09/08/2015 43.9  35.0 - 47.0 % Final  . MCV 09/08/2015 88.9  80.0 - 100.0 fL Final  . MCH 09/08/2015 30.1  26.0 - 34.0 pg Final  . MCHC 09/08/2015 33.8  32.0 - 36.0 g/dL Final  . RDW 09/08/2015 19.5* 11.5 - 14.5 % Final  . Platelets 09/08/2015 107* 150 - 440 K/uL Final  . Neutrophils Relative % 09/08/2015 69   Final  . Neutro Abs 09/08/2015 6.3  1.4 - 6.5 K/uL Final  . Lymphocytes Relative 09/08/2015 22   Final  . Lymphs Abs 09/08/2015 2.0  1.0 - 3.6 K/uL Final  . Monocytes Relative 09/08/2015 7   Final  . Monocytes Absolute 09/08/2015 0.6  0.2 - 0.9 K/uL Final  . Eosinophils Relative 09/08/2015 1   Final  . Eosinophils Absolute 09/08/2015 0.1  0 - 0.7 K/uL Final  . Basophils Relative 09/08/2015 1   Final  . Basophils Absolute 09/08/2015 0.1  0 - 0.1 K/uL Final  . Sodium 09/08/2015 130* 135 - 145 mmol/L Final  . Potassium 09/08/2015 3.1* 3.5 - 5.1 mmol/L Final  . Chloride 09/08/2015 90* 101 - 111 mmol/L Final  . CO2 09/08/2015 29  22 - 32 mmol/L Final  . Glucose, Bld 09/08/2015 153* 65 - 99 mg/dL Final  . BUN 09/08/2015 9  6 - 20 mg/dL Final  . Creatinine, Ser 09/08/2015 0.47  0.44 - 1.00 mg/dL Final  . Calcium 09/08/2015 9.1  8.9 - 10.3 mg/dL Final  . Total Protein 09/08/2015 5.7* 6.5 - 8.1 g/dL Final  . Albumin 09/08/2015 3.3* 3.5 - 5.0 g/dL Final  . AST 09/08/2015 19  15 - 41 U/L Final  . ALT 09/08/2015 24  14 - 54 U/L Final  . Alkaline Phosphatase 09/08/2015 85  38 - 126 U/L Final  . Total Bilirubin 09/08/2015 1.7* 0.3 - 1.2 mg/dL Final  . GFR calc non Af Amer 09/08/2015 >60  >60 mL/min Final  . GFR calc Af Amer 09/08/2015 >60  >60 mL/min Final    Comment: (NOTE) The eGFR has been calculated using the CKD EPI equation. This calculation has not been validated in all clinical situations. eGFR's persistently <60 mL/min signify possible Chronic Kidney Disease.   . Anion gap 09/08/2015 11  5 - 15 Final    Lab Results  Component Value Date   LABCA2 26.0 08/03/2015   .   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 Continue intrathecal chemotherapy Regarding persistent nausea we have added Zyprexa 7.5 mg by mouth at bedtime  General condition is improved.  Procedure note omaya reservoir site has been cleaned with Betadine and later on with alcohol With Huber needle 10 cc of CSF was removed.  It was clear. 12 mg of methotrexate was injected intrathecally Patient had received Decadron and Zofran prior to administration CSF was sent for  further evaluation .    Breast cancer metastasized to liver   Staging form: Breast, AJCC 7th Edition     Clinical: Stage IV (T2, N3, M1) - Signed by Forest Gleason, MD on 02/18/2015   Forest Gleason, MD   09/11/2015 7:47 AM

## 2015-09-12 ENCOUNTER — Encounter: Payer: Self-pay | Admitting: Oncology

## 2015-09-12 NOTE — Progress Notes (Signed)
[place] @ [facility] [phone]  Fax:([phone]) [place]: 1960-11-13  MR#: [id number]  TFT#:[id number]  Patient Care Team: Pcp Not In System as PCP - General Robert Bellow, MD (General Surgery)  CHIEF COMPLAINT:  Chief Complaint  Patient presents with  . Breast Cancer    Chief Complaint/Problem List  1.  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 Status post Cytoxan, Adriamycin, followed by high dose chemotherapy and stem cell support.  Status post radiation therapy. 1996 2.  Was on Tamoxifen therapy.  3.  Progressive disease with the left supraclavicular lymph node positive on needle aspiration.  4.  Tamoxifen has been discontinued.  5.  Ovarian ablation with Lupron, Femara and radiation to the ovaries starting in December 2003. 6.  On Femara  7.  Rising tumor markers.  CT scan is positive for liver metastases.  And lung metastasesBiopsies positive for metastatic disease (liver biopsy) consistent with breast primary. November, 2014 [place] and progesterone receptor positive.  HER-2 receptor negative.. 8.  Abraxene starting  from September 03, 2013.  9.Patient was taken off Abraxane because of progressive side effect and started on ERIBULIN April 08, 2014    10.  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, 2016) Patient started on intrathecal methotrexate October 2 016    Oncology Flowsheet 07/13/2015 07/14/2015 08/03/2015 08/10/2015 08/17/2015 08/24/2015 09/10/2015  Day, Cycle - - Day 1, Cycle 1 cycle 2 , Cycle 1 Day 1, Cycle 2 cycle 2 , Cycle 2 Day 1, Cycle 3  dexamethasone (DECADRON) IV [ 8 mg ] - [ 10 mg ] [ 10 mg ] [ 10 mg ] [ 10 mg ] [ 10 mg ]  eriBULin mesylate (HALAVEN) IV - - - - - - -  LORazepam (ATIVAN) IV - - - - - - -  LORazepam (ATIVAN) PO - 1 mg - - - - -  methotrexate (PF) IT - - [ 12 mg ] [ 12 mg ] [ 12 mg ] [ 12 mg ] [ 12 mg ]    ondansetron (ZOFRAN) IV [ 8 mg ] - [ 8 mg ] [ 8 mg ] [ 8 mg ] [ 8 mg ] [ 8 mg ]  pegfilgrastim (NEULASTA ONPRO KIT) [place] - - - - - - -  pegfilgrastim (NEULASTA)  - - - - - - -  prochlorperazine (COMPAZINE) IV - - - - - - -    INTERVAL HISTORY: 54 year old lady who has a 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  Patient is getting intrathecal chemotherapy Patient's general condition is improved somewhat.  Improved strength.  Patient was given Thanksgiving week off. No chills no fever appetite has been fairly good.          REVIEW OF SYSTEMS:   GENERAL:  Patient is feeling weak and tired.  Frequent fall Low-grade fever.  Aches and pains PERFORMANCE STATUS (ECOG):  2 HEENT:  No visual changes, runny nose, sore throat, mouth sores or tenderness. Lungs: No shortness of breath or cough.  No hemoptysis. Cardiac:  No chest pain, palpitations, orthopnea, or PND. GI: intermittent  Nausea and vomiting. He should not complaints of nausea and vomiting for last 2 days.  Epigastric discomfort.  Taking Nexium once a day GU:  No urgency, frequency, dysuria, or hematuria.June 09, 2015 Musculoskeletal:  No back pain.  No joint pain.  No muscle tenderness. Extremities:  No pain or swelling. Skin:  No rashes or skin changes. Neuro:  No headache, numbness or weakness, balance or coordination issues. Endocrine:  No diabetes, thyroid issues, hot flashes or night sweats. Psych:  No mood changes, depression or anxiety. Pain: Neck pain Review of systems:  All other systems reviewed and found to be negative. Epigastric discomfort which is persistent dull aching As per HPI. Otherwise, a complete review of systems is negatve.  PAST MEDICAL HISTORY: Past Medical History  Diagnosis Date  . Arthritis   . Diabetes mellitus without complication ([place])   . Colon polyps   . GERD (gastroesophageal reflux disease)   . Cancer ([place]) 2002     LEFT MASTECTOMY  . Breast  cancer, female ([place]) 1997  . Shortness of breath dyspnea   . Headache     PAST SURGICAL HISTORY: Past Surgical History  Procedure Laterality Date  . Mastectomy Left 2002  . Colonoscopy  [id number]    [email or web address]  . Foot surgery Right   . Gastric endoscopy   2002  . Bone marrow transplant    . Cholecystectomy  1995  . Sigmoidoscopy  2013    FAMILY HISTORY Family History  Problem Relation Age of Onset  . Colon cancer Sister   . COPD Mother       ADVANCED DIRECTIVES:  Patient does   Not have advanced health care directive Patient does not have any advanced healthcare directive. Information has been given.     HEALTH MAINTENANCE: Social History  Substance Use Topics  . Smoking status: Never Smoker   . Smokeless tobacco: Never Used  . Alcohol Use: No      Allergies  Allergen Reactions  . Codeine Hives and Nausea Only    Other Reaction: GI Upset  . Latex Itching and Hives  . Shellfish Allergy Swelling    Lips    Current Outpatient Prescriptions  Medication Sig Dispense Refill  . Ascorbic Acid (VITAMIN C) 1000 MG tablet Take 1,000 mg by mouth daily.    . Calcium Carb-Cholecalciferol 600-400 MG-UNIT TABS Take by mouth.    . calcium carbonate (OS-CAL - DOSED IN MG OF ELEMENTAL CALCIUM) 1250 MG tablet Take 1 tablet by mouth daily.    Marland Kitchen dexamethasone (DECADRON) 4 MG tablet Take 1 tablet (4 mg total) by mouth daily. Take 2 tabs PO daily x 15 days, then take 1 tab daily x 15 days 45 tablet 0  . esomeprazole (NEXIUM) 40 MG capsule Take 40 mg by mouth 2 (two) times daily before a meal.     . fentaNYL (DURAGESIC - DOSED MCG/HR) 25 MCG/HR patch Place 1 patch (25 mcg total) onto the skin every 3 (three) days. 10 patch 0  . ferrous sulfate 325 (65 FE) MG tablet Take by mouth.    . furosemide (LASIX) 20 MG tablet TAKE 1 TABLET EVERY DAY 30 tablet 4  . glipiZIDE (GLUCOTROL) 5 MG tablet TAKE 1 TABLET BY MOUTH 30 MINUTES BEFORE A MEAL TWICE A DAY 60 tablet 1  . KLOR-CON 10 10  MEQ tablet TAKE 1 TABLET (10 MEQ TOTAL) BY MOUTH 2 (TWO) TIMES DAILY. 60 tablet 3  . lactulose (CHRONULAC) 10 GM/15ML solution Take 15 mLs (10 g total) by mouth 2 (two) times daily as needed for mild constipation. 240 mL 0  . lidocaine-prilocaine (EMLA) cream   3  . loratadine (CLARITIN) 10 MG tablet Take 10 mg by mouth daily.    Marland Kitchen LORazepam (ATIVAN) 0.5 MG  tablet Take 1 tablet (0.5 mg total) by mouth 3 (three) times daily as needed for anxiety. 90 tablet 3  . meloxicam (MOBIC) 15 MG tablet Take 15 mg by mouth daily.    Marland Kitchen OLANZapine (ZYPREXA) 7.5 MG tablet Take 1 tablet (7.5 mg total) by mouth at bedtime. 30 tablet 0  . ondansetron (ZOFRAN) 4 MG tablet TAKE 1 TABLET (4 MG TOTAL) BY MOUTH EVERY 6 (SIX) HOURS AS NEEDED FOR NAUSEA OR VOMITING. 30 tablet 3  . oxyCODONE-acetaminophen (PERCOCET) 10-325 MG tablet Take 1 tablet by mouth every 4 (four) hours as needed for pain. 60 tablet 0  . PARoxetine (PAXIL) 40 MG tablet TAKE 1 TABLET ONCE DAILY 30 tablet 3  . polyethylene glycol powder (GLYCOLAX/MIRALAX) powder 255 grams one bottle for colonoscopy prep 255 g 0  . promethazine (PHENERGAN) 25 MG suppository Place 1 suppository (25 mg total) rectally every 6 (six) hours as needed for nausea. 12 suppository 1  . spironolactone (ALDACTONE) 100 MG tablet Take 1 tablet (100 mg total) by mouth daily. 30 tablet 6  . sulfamethoxazole-trimethoprim (BACTRIM DS,SEPTRA DS) 800-160 MG per tablet Take 1 tablet by mouth 2 (two) times daily. 4 tablet 0  . vitamin E 400 UNIT capsule Take 400 Units by mouth daily.     No current facility-administered medications for this visit.   Facility-Administered Medications Ordered in Other Visits  Medication Dose Route Frequency Provider Last Rate Last Dose  . 0.9 %  sodium chloride infusion   Intravenous Continuous Forest Gleason, MD 10 mL/hr at 09/10/15 0940    . heparin lock flush 100 unit/mL  500 Units Intravenous Once Forest Gleason, MD   500 Units at 09/10/15 1254  . sodium  chloride 0.9 % injection 10 mL  10 mL Intracatheter PRN Forest Gleason, MD   10 mL at 02/18/15 1453  . sodium chloride 0.9 % injection 10 mL  10 mL Intracatheter PRN Forest Gleason, MD   10 mL at 08/10/15 1400    OBJECTIVE:  Filed Vitals:   09/08/15 1049  BP: 101/80  Pulse: 118  Temp: 96.5 F (35.8 C)     Body mass index is 28 kg/(m^2).    ECOG FS:2 - Symptomatic, <50% confined to bed  PHYSICAL EXAM: GENERAL:  Well developed, well nourished, sitting comfortably in the exam room in no acute distress. MENTAL STATUS:  Alert and oriented to person, place and time. HEAD:alopecia i.  Normocephalic, atraumatic, face symmetric, no Cushingoid features. EYES:  Pupils equal round and reactive to light and accomodation.  No conjunctivitis or scleral icterus. ENT:  Oropharynx clear without lesion.  Tongue normal. Mucous membranes moist.  RESPIRATORY:  Clear to auscultation without rales, wheezes or rhonchi. CARDIOVASCULAR:  Regular rate and rhythm without murmur, rub or gallop. Abdomen: Tenderness in epigastric area.  Liver is not palpable.  No ascites. Oh palpable mass BACK:  No CVA tenderness.  No tenderness on percussion of the back or rib cage. SKIN:  No rashes, ulcers or lesions. EXTREMITIES: No edema, no skin discoloration or tenderness.  No palpable cords. LYMPH NODES: No palpable cervical, supraclavicular, axillary or inguinal adenopathy  NEUROLOGICAL: Unremarkable. PSYCH:  Appropriate. Neurological system: 6 nerve  palsy on the right side.  Possibility of right 7 nerve  Palsy Weakness in both lower extremity.  Complete examination is difficult.                    LAB RESULTS:  Appointment on 09/08/2015  Component Date Value Ref Range  Status  . WBC 09/08/2015 9.1  3.6 - 11.0 K/uL Final  . RBC 09/08/2015 4.94  3.80 - 5.20 MIL/uL Final  . Hemoglobin 09/08/2015 14.9  12.0 - 16.0 g/dL Final  . HCT 09/08/2015 43.9  35.0 - 47.0 % Final  . MCV 09/08/2015 88.9  80.0 - 100.0 fL Final  . MCH  09/08/2015 30.1  26.0 - 34.0 pg Final  . MCHC 09/08/2015 33.8  32.0 - 36.0 g/dL Final  . RDW 09/08/2015 19.5* 11.5 - 14.5 % Final  . Platelets 09/08/2015 107* 150 - 440 K/uL Final  . Neutrophils Relative % 09/08/2015 69   Final  . Neutro Abs 09/08/2015 6.3  1.4 - 6.5 K/uL Final  . Lymphocytes Relative 09/08/2015 22   Final  . Lymphs Abs 09/08/2015 2.0  1.0 - 3.6 K/uL Final  . Monocytes Relative 09/08/2015 7   Final  . Monocytes Absolute 09/08/2015 0.6  0.2 - 0.9 K/uL Final  . Eosinophils Relative 09/08/2015 1   Final  . Eosinophils Absolute 09/08/2015 0.1  0 - 0.7 K/uL Final  . Basophils Relative 09/08/2015 1   Final  . Basophils Absolute 09/08/2015 0.1  0 - 0.1 K/uL Final  . Sodium 09/08/2015 130* 135 - 145 mmol/L Final  . Potassium 09/08/2015 3.1* 3.5 - 5.1 mmol/L Final  . Chloride 09/08/2015 90* 101 - 111 mmol/L Final  . CO2 09/08/2015 29  22 - 32 mmol/L Final  . Glucose, Bld 09/08/2015 153* 65 - 99 mg/dL Final  . BUN 09/08/2015 9  6 - 20 mg/dL Final  . Creatinine, Ser 09/08/2015 0.47  0.44 - 1.00 mg/dL Final  . Calcium 09/08/2015 9.1  8.9 - 10.3 mg/dL Final  . Total Protein 09/08/2015 5.7* 6.5 - 8.1 g/dL Final  . Albumin 09/08/2015 3.3* 3.5 - 5.0 g/dL Final  . AST 09/08/2015 19  15 - 41 U/L Final  . ALT 09/08/2015 24  14 - 54 U/L Final  . Alkaline Phosphatase 09/08/2015 85  38 - 126 U/L Final  . Total Bilirubin 09/08/2015 1.7* 0.3 - 1.2 mg/dL Final  . GFR calc non Af Amer 09/08/2015 >60  >60 mL/min Final  . GFR calc Af Amer 09/08/2015 >60  >60 mL/min Final   Comment: (NOTE) The eGFR has been calculated using the CKD EPI equation. This calculation has not been validated in all clinical situations. eGFR's persistently <60 mL/min signify possible Chronic Kidney Disease.   . Anion gap 09/08/2015 11  5 - 15 Final    Lab Results  Component Value Date   LABCA2 26.0 08/03/2015   .   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 Continue intrathecal  chemotherapy Regarding persistent nausea we have added Zyprexa 7.5 mg by mouth at bedtime And added Decadron 8 mg for 2 weeks followed by 4 mg for 2 weeks Procedure note omaya reservoir site has been cleaned with Betadine and later on with alcohol With Huber needle 10 cc of CSF was removed.  It was clear. 12 mg of methotrexate was injected intrathecally Patient had received Decadron and Zofran prior to administration CSF was sent for further evaluation Reevaluate patient in 2 weeks patient will be given Thanksgiving week off from treatment and then reassessment if there is no significant improvement possibility of hospice and palliative care would be discussed. Is and can be started on anti-hormonal therapy  This was discussed with the patient's family  The patient is in need of a gel overlay due to limited mobility as well  as impaired nutritional status and incontinence. Order to be faxed to [facility].    Breast cancer metastasized to liver   Staging form: Breast, AJCC 7th Edition     Clinical: Stage IV (T2, N3, M1) - Signed by Forest Gleason, MD on 02/18/2015   Forest Gleason, MD   09/12/2015 10:05 AM

## 2015-09-16 ENCOUNTER — Inpatient Hospital Stay (HOSPITAL_BASED_OUTPATIENT_CLINIC_OR_DEPARTMENT_OTHER): Payer: Managed Care, Other (non HMO) | Admitting: Oncology

## 2015-09-16 ENCOUNTER — Inpatient Hospital Stay: Payer: Managed Care, Other (non HMO)

## 2015-09-16 VITALS — BP 115/83 | HR 114 | Temp 98.3°F

## 2015-09-16 DIAGNOSIS — C7800 Secondary malignant neoplasm of unspecified lung: Secondary | ICD-10-CM

## 2015-09-16 DIAGNOSIS — Z79899 Other long term (current) drug therapy: Secondary | ICD-10-CM

## 2015-09-16 DIAGNOSIS — C7951 Secondary malignant neoplasm of bone: Secondary | ICD-10-CM | POA: Diagnosis not present

## 2015-09-16 DIAGNOSIS — C787 Secondary malignant neoplasm of liver and intrahepatic bile duct: Secondary | ICD-10-CM

## 2015-09-16 DIAGNOSIS — C50919 Malignant neoplasm of unspecified site of unspecified female breast: Secondary | ICD-10-CM

## 2015-09-16 DIAGNOSIS — C50912 Malignant neoplasm of unspecified site of left female breast: Secondary | ICD-10-CM

## 2015-09-16 DIAGNOSIS — Z800 Family history of malignant neoplasm of digestive organs: Secondary | ICD-10-CM

## 2015-09-16 DIAGNOSIS — C7949 Secondary malignant neoplasm of other parts of nervous system: Secondary | ICD-10-CM | POA: Diagnosis not present

## 2015-09-16 DIAGNOSIS — Z9181 History of falling: Secondary | ICD-10-CM

## 2015-09-16 DIAGNOSIS — Z7984 Long term (current) use of oral hypoglycemic drugs: Secondary | ICD-10-CM

## 2015-09-16 DIAGNOSIS — R5383 Other fatigue: Secondary | ICD-10-CM

## 2015-09-16 DIAGNOSIS — C779 Secondary and unspecified malignant neoplasm of lymph node, unspecified: Secondary | ICD-10-CM

## 2015-09-16 DIAGNOSIS — R531 Weakness: Secondary | ICD-10-CM

## 2015-09-16 DIAGNOSIS — Z9012 Acquired absence of left breast and nipple: Secondary | ICD-10-CM

## 2015-09-16 DIAGNOSIS — Z5111 Encounter for antineoplastic chemotherapy: Secondary | ICD-10-CM | POA: Diagnosis not present

## 2015-09-16 DIAGNOSIS — C7932 Secondary malignant neoplasm of cerebral meninges: Secondary | ICD-10-CM

## 2015-09-16 DIAGNOSIS — Z86010 Personal history of colonic polyps: Secondary | ICD-10-CM

## 2015-09-16 DIAGNOSIS — Z923 Personal history of irradiation: Secondary | ICD-10-CM

## 2015-09-16 DIAGNOSIS — R1011 Right upper quadrant pain: Secondary | ICD-10-CM

## 2015-09-16 DIAGNOSIS — E119 Type 2 diabetes mellitus without complications: Secondary | ICD-10-CM

## 2015-09-16 DIAGNOSIS — M129 Arthropathy, unspecified: Secondary | ICD-10-CM

## 2015-09-16 DIAGNOSIS — K219 Gastro-esophageal reflux disease without esophagitis: Secondary | ICD-10-CM

## 2015-09-16 DIAGNOSIS — R110 Nausea: Secondary | ICD-10-CM

## 2015-09-16 DIAGNOSIS — Z170 Estrogen receptor positive status [ER+]: Secondary | ICD-10-CM

## 2015-09-16 LAB — COMPREHENSIVE METABOLIC PANEL
ALT: 31 U/L (ref 14–54)
ANION GAP: 10 (ref 5–15)
AST: 23 U/L (ref 15–41)
Albumin: 2.9 g/dL — ABNORMAL LOW (ref 3.5–5.0)
Alkaline Phosphatase: 95 U/L (ref 38–126)
BUN: 15 mg/dL (ref 6–20)
CHLORIDE: 97 mmol/L — AB (ref 101–111)
CO2: 25 mmol/L (ref 22–32)
CREATININE: 0.48 mg/dL (ref 0.44–1.00)
Calcium: 8.5 mg/dL — ABNORMAL LOW (ref 8.9–10.3)
Glucose, Bld: 108 mg/dL — ABNORMAL HIGH (ref 65–99)
POTASSIUM: 3.2 mmol/L — AB (ref 3.5–5.1)
SODIUM: 132 mmol/L — AB (ref 135–145)
Total Bilirubin: 2.4 mg/dL — ABNORMAL HIGH (ref 0.3–1.2)
Total Protein: 5.4 g/dL — ABNORMAL LOW (ref 6.5–8.1)

## 2015-09-16 LAB — CBC WITH DIFFERENTIAL/PLATELET
Basophils Absolute: 0 10*3/uL (ref 0–0.1)
Basophils Relative: 0 %
EOS ABS: 0.2 10*3/uL (ref 0–0.7)
EOS PCT: 3 %
HCT: 39 % (ref 35.0–47.0)
Hemoglobin: 13.3 g/dL (ref 12.0–16.0)
LYMPHS ABS: 1.7 10*3/uL (ref 1.0–3.6)
LYMPHS PCT: 22 %
MCH: 30.6 pg (ref 26.0–34.0)
MCHC: 34 g/dL (ref 32.0–36.0)
MCV: 90 fL (ref 80.0–100.0)
MONO ABS: 0.3 10*3/uL (ref 0.2–0.9)
Monocytes Relative: 4 %
Neutro Abs: 5.5 10*3/uL (ref 1.4–6.5)
Neutrophils Relative %: 71 %
PLATELETS: 67 10*3/uL — AB (ref 150–440)
RBC: 4.34 MIL/uL (ref 3.80–5.20)
RDW: 18.5 % — AB (ref 11.5–14.5)
WBC: 7.7 10*3/uL (ref 3.6–11.0)

## 2015-09-16 MED ORDER — HEPARIN SOD (PORK) LOCK FLUSH 100 UNIT/ML IV SOLN
500.0000 [IU] | Freq: Once | INTRAVENOUS | Status: AC
  Administered 2015-09-16: 500 [IU] via INTRAVENOUS
  Filled 2015-09-16: qty 5

## 2015-09-16 MED ORDER — SODIUM CHLORIDE 0.9 % IJ SOLN
Freq: Once | INTRAMUSCULAR | Status: AC
  Administered 2015-09-16: 12:00:00 via INTRATHECAL
  Filled 2015-09-16: qty 0.48

## 2015-09-16 MED ORDER — SODIUM CHLORIDE 0.9 % IJ SOLN
10.0000 mL | INTRAMUSCULAR | Status: AC | PRN
  Administered 2015-09-16: 10 mL via INTRAVENOUS
  Filled 2015-09-16: qty 10

## 2015-09-16 MED ORDER — SODIUM CHLORIDE 0.9 % IV SOLN
Freq: Once | INTRAVENOUS | Status: AC
  Administered 2015-09-16: 10:00:00 via INTRAVENOUS
  Filled 2015-09-16: qty 4

## 2015-09-17 ENCOUNTER — Encounter: Payer: Self-pay | Admitting: Oncology

## 2015-09-17 NOTE — Progress Notes (Signed)
[place] @ [facility] [phone]  Fax:([phone] Beat[place] OB: 11/12/1960  MR#: [id number]  TMH#:[id number]  Patient Care Team: Pcp Not In System as PCP - General Robert Bellow, MD (General Surgery)  CHIEF COMPLAINT:  Chief Complaint  Patient presents with  . Breast Cancer    Chief Complaint/Problem List  1.  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 Status post Cytoxan, Adriamycin, followed by high dose chemotherapy and stem cell support.  Status post radiation therapy. 1996 2.  Was on Tamoxifen therapy.  3.  Progressive disease with the left supraclavicular lymph node positive on needle aspiration.  4.  Tamoxifen has been discontinued.  5.  Ovarian ablation with Lupron, Femara and radiation to the ovaries starting in December 2003. 6.  On Femara  7.  Rising tumor markers.  CT scan is positive for liver metastases.  And lung metastasesBiopsies positive for metastatic disease (liver biopsy) consistent with breast primary. November, 2014 [place] and progesterone receptor positive.  HER-2 receptor negative.. 8.  Abraxene starting  from September 03, 2013.  9.Patient was taken off Abraxane because of progressive side effect and started on ERIBULIN April 08, 2014    10.  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, 2016) Patient started on intrathecal methotrexate October 2 016    Oncology Flowsheet 07/14/2015 08/03/2015 08/10/2015 08/17/2015 08/24/2015 09/10/2015 09/16/2015  Day, Cycle - Day 1, Cycle 1 cycle 2 , Cycle 1 Day 1, Cycle 2 cycle 2 , Cycle 2 Day 1, Cycle 3 cycle 2 , Cycle 3  dexamethasone (DECADRON) IV - [ 10 mg ] [ 10 mg ] [ 10 mg ] [ 10 mg ] [ 10 mg ] [ 10 mg ]  eriBULin mesylate (HALAVEN) IV - - - - - - -  LORazepam (ATIVAN) IV - - - - - - -  LORazepam (ATIVAN) PO 1 mg - - - - - -  methotrexate (PF) IT - [ 12 mg ] [ 12 mg ] [ 12 mg ] [ 12 mg  ] [ 12 mg ] [ 12 mg ]  ondansetron (ZOFRAN) IV - [ 8 mg ] [ 8 mg ] [ 8 mg ] [ 8 mg ] [ 8 mg ] [ 8 mg ]  pegfilgrastim (NEULASTA ONPRO KIT) Maybeury - - - - - - -  pegfilgrastim (NEULASTA)  - - - - - - -  prochlorperazine (COMPAZINE) IV - - - - - - -    INTERVAL HISTORY: 54 year old lady who has a 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  Patient is getting intrathecal chemotherapy  He should not is here for ongoing evaluation and continuation of intrathecal chemotherapy.  See has gained some movement in both lower extremity.  But not enough to bear weight and ambulate on herself.  She is more alert. No chills fever.  Occasional headache.  Appetite has been stable.      REVIEW OF SYSTEMS:   GENERAL:  Patient is feeling weak and tired.  Frequent fall Low-grade fever.  Aches and pains PERFORMANCE STATUS (ECOG):  2 HEENT:  No visual changes, runny nose, sore throat, mouth sores or tenderness. Lungs: No shortness of breath or cough.  No hemoptysis. Cardiac:  No chest pain, palpitations, orthopnea, or PND. GI: intermittent  Nausea and vomiting. He should not complaints of nausea and vomiting for last 2 days.  Epigastric discomfort.  Taking Nexium once a day GU:  No urgency, frequency, dysuria, or hematuria.June 09, 2015 Musculoskeletal:  No back pain.  No joint pain.  No muscle tenderness. Extremities:  No pain or swelling. Skin:  No rashes or skin changes. Neuro:  No headache, numbness or weakness, balance or coordination issues. Endocrine:  No diabetes, thyroid issues, hot flashes or night sweats. Psych:  No mood changes, depression or anxiety. Pain: Neck pain Review of systems:  All other systems reviewed and found to be negative. Epigastric discomfort which is persistent dull aching As per HPI. Otherwise, a complete review of systems is negatve.  PAST MEDICAL HISTORY: Past Medical History  Diagnosis Date  . Arthritis   . Diabetes mellitus  without complication ([place])   . Colon polyps   . GERD (gastroesophageal reflux disease)   . Cancer ([place]) 2002     LEFT MASTECTOMY  . Breast cancer, female ([place]) 1997  . Shortness of breath dyspnea   . Headache     PAST SURGICAL HISTORY: Past Surgical History  Procedure Laterality Date  . Mastectomy Left 2002  . Colonoscopy  [id number]    DR.[email or web address]  . Foot surgery Right   . Gastric endoscopy   2002  . Bone marrow transplant    . Cholecystectomy  1995  . Sigmoidoscopy  2013    FAMILY HISTORY Family History  Problem Relation Age of Onset  . Colon cancer Sister   . COPD Mother       ADVANCED DIRECTIVES:  Patient does   Not have advanced health care directive Patient does not have any advanced healthcare directive. Information has been given.     HEALTH MAINTENANCE: Social History  Substance Use Topics  . Smoking status: Never Smoker   . Smokeless tobacco: Never Used  . Alcohol Use: No      Allergies  Allergen Reactions  . Codeine Hives and Nausea Only    Other Reaction: GI Upset  . Latex Itching and Hives  . Shellfish Allergy Swelling    Lips    Current Outpatient Prescriptions  Medication Sig Dispense Refill  . Ascorbic Acid (VITAMIN C) 1000 MG tablet Take 1,000 mg by mouth daily.    . Calcium Carb-Cholecalciferol 600-400 MG-UNIT TABS Take by mouth.    . calcium carbonate (OS-CAL - DOSED IN MG OF ELEMENTAL CALCIUM) 1250 MG tablet Take 1 tablet by mouth daily.    Marland Kitchen dexamethasone (DECADRON) 4 MG tablet Take 1 tablet (4 mg total) by mouth daily. Take 2 tabs PO daily x 15 days, then take 1 tab daily x 15 days 45 tablet 0  . esomeprazole (NEXIUM) 40 MG capsule Take 40 mg by mouth 2 (two) times daily before a meal.     . fentaNYL (DURAGESIC - DOSED MCG/HR) 25 MCG/HR patch Place 1 patch (25 mcg total) onto the skin every 3 (three) days. 10 patch 0  . ferrous sulfate 325 (65 FE) MG tablet Take by mouth.    . furosemide (LASIX) 20 MG tablet TAKE 1 TABLET EVERY DAY  30 tablet 4  . glipiZIDE (GLUCOTROL) 5 MG tablet TAKE 1 TABLET BY MOUTH 30 MINUTES BEFORE A MEAL TWICE A DAY 60 tablet 1  . KLOR-CON 10 10 MEQ tablet TAKE 1 TABLET (10 MEQ TOTAL) BY MOUTH 2 (TWO) TIMES DAILY. 60 tablet 3  . lactulose (CHRONULAC) 10 GM/15ML solution Take 15 mLs (10 g total) by mouth 2 (two) times daily as needed for mild constipation. 240 mL 0  .  lidocaine-prilocaine (EMLA) cream   3  . loratadine (CLARITIN) 10 MG tablet Take 10 mg by mouth daily.    Marland Kitchen LORazepam (ATIVAN) 0.5 MG tablet Take 1 tablet (0.5 mg total) by mouth 3 (three) times daily as needed for anxiety. 90 tablet 3  . meloxicam (MOBIC) 15 MG tablet Take 15 mg by mouth daily.    Marland Kitchen OLANZapine (ZYPREXA) 7.5 MG tablet Take 1 tablet (7.5 mg total) by mouth at bedtime. 30 tablet 0  . ondansetron (ZOFRAN) 4 MG tablet TAKE 1 TABLET (4 MG TOTAL) BY MOUTH EVERY 6 (SIX) HOURS AS NEEDED FOR NAUSEA OR VOMITING. 30 tablet 3  . oxyCODONE-acetaminophen (PERCOCET) 10-325 MG tablet Take 1 tablet by mouth every 4 (four) hours as needed for pain. 60 tablet 0  . PARoxetine (PAXIL) 40 MG tablet TAKE 1 TABLET ONCE DAILY 30 tablet 3  . polyethylene glycol powder (GLYCOLAX/MIRALAX) powder 255 grams one bottle for colonoscopy prep 255 g 0  . promethazine (PHENERGAN) 25 MG suppository Place 1 suppository (25 mg total) rectally every 6 (six) hours as needed for nausea. 12 suppository 1  . spironolactone (ALDACTONE) 100 MG tablet Take 1 tablet (100 mg total) by mouth daily. 30 tablet 6  . sulfamethoxazole-trimethoprim (BACTRIM DS,SEPTRA DS) 800-160 MG per tablet Take 1 tablet by mouth 2 (two) times daily. 4 tablet 0  . vitamin E 400 UNIT capsule Take 400 Units by mouth daily.     No current facility-administered medications for this visit.   Facility-Administered Medications Ordered in Other Visits  Medication Dose Route Frequency Provider Last Rate Last Dose  . 0.9 %  sodium chloride infusion   Intravenous Continuous Forest Gleason, MD 10 mL/hr  at 09/10/15 0940    . heparin lock flush 100 unit/mL  500 Units Intravenous Once Forest Gleason, MD   500 Units at 09/10/15 1254  . sodium chloride 0.9 % injection 10 mL  10 mL Intracatheter PRN Forest Gleason, MD   10 mL at 02/18/15 1453  . sodium chloride 0.9 % injection 10 mL  10 mL Intracatheter PRN Forest Gleason, MD   10 mL at 08/10/15 1400  . sodium chloride 0.9 % injection 10 mL  10 mL Intravenous PRN Forest Gleason, MD   10 mL at 09/16/15 0900    OBJECTIVE:  Filed Vitals:   09/16/15 1110  BP: 115/83  Pulse: 114  Temp: 98.3 F (36.8 C)     There is no weight on file to calculate BMI.    ECOG FS:2 - Symptomatic, <50% confined to bed  PHYSICAL EXAM: GENERAL:  Patient is in wheelchair. MENTAL STATUS: cc slightly more alert oriented and conversational. HEAD:alopecia i.  Normocephalic, atraumatic, face symmetric, no Cushingoid features. EYES:  Pupils equal round and reactive to light and accomodation.  No conjunctivitis or scleral icterus. ENT:  Oropharynx clear without lesion.  Tongue normal. Mucous membranes moist.  RESPIRATORY:  Clear to auscultation without rales, wheezes or rhonchi. CARDIOVASCULAR:  Regular rate and rhythm without murmur, rub or gallop. Abdomen: Tenderness in epigastric area.  Liver is not palpable.  No ascites. Oh palpable mass BACK:  No CVA tenderness.  No tenderness on percussion of the back or rib cage. SKIN:  No rashes, ulcers or lesions. EXTREMITIES: No edema, no skin discoloration or tenderness.  No palpable cords. LYMPH NODES: No palpable cervical, supraclavicular, axillary or inguinal adenopathy  NEUROLOGICAL:higher functions improvement.  Weakness in both lower extremity.  PSYCH:  Appropriate. Neurological system: 6 nerve  palsy on the right  side.  Possibility of right 7 nerve  Palsy Weakness in both lower extremity.  Complete examination is difficult.                    LAB RESULTS:  Infusion on 09/16/2015  Component Date Value Ref Range Status  .  WBC 09/16/2015 7.7  3.6 - 11.0 K/uL Final   A-LINE DRAW  . RBC 09/16/2015 4.34  3.80 - 5.20 MIL/uL Final  . Hemoglobin 09/16/2015 13.3  12.0 - 16.0 g/dL Final  . HCT 09/16/2015 39.0  35.0 - 47.0 % Final  . MCV 09/16/2015 90.0  80.0 - 100.0 fL Final  . MCH 09/16/2015 30.6  26.0 - 34.0 pg Final  . MCHC 09/16/2015 34.0  32.0 - 36.0 g/dL Final  . RDW 09/16/2015 18.5* 11.5 - 14.5 % Final  . Platelets 09/16/2015 67* 150 - 440 K/uL Final  . Neutrophils Relative % 09/16/2015 71   Final  . Neutro Abs 09/16/2015 5.5  1.4 - 6.5 K/uL Final  . Lymphocytes Relative 09/16/2015 22   Final  . Lymphs Abs 09/16/2015 1.7  1.0 - 3.6 K/uL Final  . Monocytes Relative 09/16/2015 4   Final  . Monocytes Absolute 09/16/2015 0.3  0.2 - 0.9 K/uL Final  . Eosinophils Relative 09/16/2015 3   Final  . Eosinophils Absolute 09/16/2015 0.2  0 - 0.7 K/uL Final  . Basophils Relative 09/16/2015 0   Final  . Basophils Absolute 09/16/2015 0.0  0 - 0.1 K/uL Final  . Sodium 09/16/2015 132* 135 - 145 mmol/L Final  . Potassium 09/16/2015 3.2* 3.5 - 5.1 mmol/L Final  . Chloride 09/16/2015 97* 101 - 111 mmol/L Final  . CO2 09/16/2015 25  22 - 32 mmol/L Final  . Glucose, Bld 09/16/2015 108* 65 - 99 mg/dL Final  . BUN 09/16/2015 15  6 - 20 mg/dL Final  . Creatinine, Ser 09/16/2015 0.48  0.44 - 1.00 mg/dL Final  . Calcium 09/16/2015 8.5* 8.9 - 10.3 mg/dL Final  . Total Protein 09/16/2015 5.4* 6.5 - 8.1 g/dL Final  . Albumin 09/16/2015 2.9* 3.5 - 5.0 g/dL Final  . AST 09/16/2015 23  15 - 41 U/L Final  . ALT 09/16/2015 31  14 - 54 U/L Final  . Alkaline Phosphatase 09/16/2015 95  38 - 126 U/L Final  . Total Bilirubin 09/16/2015 2.4* 0.3 - 1.2 mg/dL Final  . GFR calc non Af Amer 09/16/2015 >60  >60 mL/min Final  . GFR calc Af Amer 09/16/2015 >60  >60 mL/min Final   Comment: (NOTE) The eGFR has been calculated using the CKD EPI equation. This calculation has not been validated in all clinical situations. eGFR's persistently <60  mL/min signify possible Chronic Kidney Disease.   . Anion gap 09/16/2015 10  5 - 15 Final    Lab Results  Component Value Date   LABCA2 26.0 08/03/2015   .   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 Continue intrathecal chemotherapy Regarding persistent nausea we have added Zyprexa 7.5 mg by mouth at bedtime  General condition is improved. This and does not have any improvement in ambulation yet.  Neurologically shows some improvement But strength in both lower extremity is still diminished Easier therapy to progress All lab data has been reviewed Right upper quadrant pain if it continues CT scan would be done possibility of going back on chemotherapy would be considered.  Procedure note omaya reservoir site has been cleaned with Betadine and later on with alcohol  With Huber needle 10 cc of CSF was removed.  It was clear. 12 mg of methotrexate was injected intrathecally Patient had received Decadron and Zofran prior to administration    Breast cancer metastasized to liver   Staging form: Breast, AJCC 7th Edition     Clinical: Stage IV (T2, N3, M1) - Signed by Forest Gleason, MD on 02/18/2015   Forest Gleason, MD   09/17/2015 10:11 AM

## 2015-09-19 ENCOUNTER — Other Ambulatory Visit: Payer: Self-pay | Admitting: Oncology

## 2015-09-21 ENCOUNTER — Telehealth: Payer: Self-pay | Admitting: *Deleted

## 2015-09-21 MED ORDER — ONDANSETRON HCL 4 MG PO TABS: ORAL_TABLET | ORAL | Status: AC

## 2015-09-21 NOTE — Telephone Encounter (Signed)
Escribed

## 2015-09-23 ENCOUNTER — Inpatient Hospital Stay: Payer: Managed Care, Other (non HMO)

## 2015-09-23 ENCOUNTER — Inpatient Hospital Stay (HOSPITAL_BASED_OUTPATIENT_CLINIC_OR_DEPARTMENT_OTHER): Payer: Managed Care, Other (non HMO) | Admitting: Oncology

## 2015-09-23 ENCOUNTER — Encounter: Payer: Self-pay | Admitting: Oncology

## 2015-09-23 VITALS — BP 121/81 | HR 125 | Temp 96.3°F | Resp 18

## 2015-09-23 DIAGNOSIS — C50912 Malignant neoplasm of unspecified site of left female breast: Secondary | ICD-10-CM | POA: Diagnosis not present

## 2015-09-23 DIAGNOSIS — Z86010 Personal history of colonic polyps: Secondary | ICD-10-CM

## 2015-09-23 DIAGNOSIS — Z9012 Acquired absence of left breast and nipple: Secondary | ICD-10-CM

## 2015-09-23 DIAGNOSIS — Z923 Personal history of irradiation: Secondary | ICD-10-CM

## 2015-09-23 DIAGNOSIS — R5383 Other fatigue: Secondary | ICD-10-CM

## 2015-09-23 DIAGNOSIS — R1011 Right upper quadrant pain: Secondary | ICD-10-CM

## 2015-09-23 DIAGNOSIS — C7800 Secondary malignant neoplasm of unspecified lung: Secondary | ICD-10-CM

## 2015-09-23 DIAGNOSIS — Z800 Family history of malignant neoplasm of digestive organs: Secondary | ICD-10-CM

## 2015-09-23 DIAGNOSIS — R110 Nausea: Secondary | ICD-10-CM

## 2015-09-23 DIAGNOSIS — K219 Gastro-esophageal reflux disease without esophagitis: Secondary | ICD-10-CM

## 2015-09-23 DIAGNOSIS — C787 Secondary malignant neoplasm of liver and intrahepatic bile duct: Principal | ICD-10-CM

## 2015-09-23 DIAGNOSIS — C779 Secondary and unspecified malignant neoplasm of lymph node, unspecified: Secondary | ICD-10-CM

## 2015-09-23 DIAGNOSIS — C7932 Secondary malignant neoplasm of cerebral meninges: Secondary | ICD-10-CM | POA: Diagnosis not present

## 2015-09-23 DIAGNOSIS — E119 Type 2 diabetes mellitus without complications: Secondary | ICD-10-CM

## 2015-09-23 DIAGNOSIS — C7949 Secondary malignant neoplasm of other parts of nervous system: Secondary | ICD-10-CM

## 2015-09-23 DIAGNOSIS — A170 Tuberculous meningitis: Secondary | ICD-10-CM

## 2015-09-23 DIAGNOSIS — C7951 Secondary malignant neoplasm of bone: Secondary | ICD-10-CM | POA: Diagnosis not present

## 2015-09-23 DIAGNOSIS — C50919 Malignant neoplasm of unspecified site of unspecified female breast: Secondary | ICD-10-CM

## 2015-09-23 DIAGNOSIS — Z7984 Long term (current) use of oral hypoglycemic drugs: Secondary | ICD-10-CM

## 2015-09-23 DIAGNOSIS — M129 Arthropathy, unspecified: Secondary | ICD-10-CM

## 2015-09-23 DIAGNOSIS — Z9181 History of falling: Secondary | ICD-10-CM

## 2015-09-23 DIAGNOSIS — Z5111 Encounter for antineoplastic chemotherapy: Secondary | ICD-10-CM | POA: Diagnosis not present

## 2015-09-23 DIAGNOSIS — R531 Weakness: Secondary | ICD-10-CM

## 2015-09-23 DIAGNOSIS — Z79899 Other long term (current) drug therapy: Secondary | ICD-10-CM

## 2015-09-23 LAB — CBC WITH DIFFERENTIAL/PLATELET
Basophils Absolute: 0 10*3/uL (ref 0–0.1)
Basophils Relative: 1 %
EOS ABS: 0.3 10*3/uL (ref 0–0.7)
Eosinophils Relative: 5 %
HCT: 38 % (ref 35.0–47.0)
HEMOGLOBIN: 12.7 g/dL (ref 12.0–16.0)
LYMPHS PCT: 27 %
Lymphs Abs: 1.7 10*3/uL (ref 1.0–3.6)
MCH: 30.3 pg (ref 26.0–34.0)
MCHC: 33.5 g/dL (ref 32.0–36.0)
MCV: 90.6 fL (ref 80.0–100.0)
MONOS PCT: 3 %
Monocytes Absolute: 0.2 10*3/uL (ref 0.2–0.9)
NEUTROS ABS: 4 10*3/uL (ref 1.4–6.5)
Neutrophils Relative %: 64 %
Platelets: 40 10*3/uL — ABNORMAL LOW (ref 150–440)
RBC: 4.2 MIL/uL (ref 3.80–5.20)
RDW: 18.3 % — ABNORMAL HIGH (ref 11.5–14.5)
WBC: 6.2 10*3/uL (ref 3.6–11.0)

## 2015-09-23 LAB — COMPREHENSIVE METABOLIC PANEL
ALK PHOS: 73 U/L (ref 38–126)
ALT: 49 U/L (ref 14–54)
ANION GAP: 7 (ref 5–15)
AST: 22 U/L (ref 15–41)
Albumin: 3 g/dL — ABNORMAL LOW (ref 3.5–5.0)
BILIRUBIN TOTAL: 1.3 mg/dL — AB (ref 0.3–1.2)
BUN: 14 mg/dL (ref 6–20)
CALCIUM: 8.6 mg/dL — AB (ref 8.9–10.3)
CO2: 27 mmol/L (ref 22–32)
Chloride: 99 mmol/L — ABNORMAL LOW (ref 101–111)
Creatinine, Ser: 0.41 mg/dL — ABNORMAL LOW (ref 0.44–1.00)
Glucose, Bld: 134 mg/dL — ABNORMAL HIGH (ref 65–99)
Potassium: 3.4 mmol/L — ABNORMAL LOW (ref 3.5–5.1)
SODIUM: 133 mmol/L — AB (ref 135–145)
TOTAL PROTEIN: 5.3 g/dL — AB (ref 6.5–8.1)

## 2015-09-23 MED ORDER — OXYCODONE-ACETAMINOPHEN 10-325 MG PO TABS: 1.0000 | ORAL_TABLET | ORAL | Status: DC | PRN

## 2015-09-23 MED ORDER — HEPARIN SOD (PORK) LOCK FLUSH 100 UNIT/ML IV SOLN
500.0000 [IU] | Freq: Once | INTRAVENOUS | Status: AC
Start: 2015-09-23 — End: 2015-09-23
  Administered 2015-09-23: 500 [IU] via INTRAVENOUS
  Filled 2015-09-23: qty 5

## 2015-09-23 MED ORDER — SODIUM CHLORIDE 0.9 % IJ SOLN
Freq: Once | INTRAMUSCULAR | Status: AC
  Administered 2015-09-23: 12:00:00 via INTRATHECAL
  Filled 2015-09-23: qty 0.48

## 2015-09-23 MED ORDER — SODIUM CHLORIDE 0.9 % IJ SOLN
10.0000 mL | Freq: Once | INTRAMUSCULAR | Status: AC
  Administered 2015-09-23: 10 mL via INTRAVENOUS
  Filled 2015-09-23: qty 10

## 2015-09-23 MED ORDER — SODIUM CHLORIDE 0.9 % IV SOLN
Freq: Once | INTRAVENOUS | Status: AC
  Administered 2015-09-23: 10:00:00 via INTRAVENOUS
  Filled 2015-09-23: qty 4

## 2015-09-23 MED ORDER — SODIUM CHLORIDE 0.9 % IV SOLN
INTRAVENOUS | Status: DC
  Administered 2015-09-23: 10:00:00 via INTRAVENOUS
  Filled 2015-09-23: qty 1000

## 2015-09-23 NOTE — Progress Notes (Signed)
[place] @ [facility] [phone]  Fax:([phone]) [place] OB: 1961-04-27  MR#: [id number]  QIW#:[id number]  Patient Care Team: Pcp Not In System as PCP - General Robert Bellow, MD (General Surgery)  CHIEF COMPLAINT:  Chief Complaint  Patient presents with  . Breast Cancer    Chief Complaint/Problem List  1.  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 Status post Cytoxan, Adriamycin, followed by high dose chemotherapy and stem cell support.  Status post radiation therapy. 1996 2.  Was on Tamoxifen therapy.  3.  Progressive disease with the left supraclavicular lymph node positive on needle aspiration.  4.  Tamoxifen has been discontinued.  5.  Ovarian ablation with Lupron, Femara and radiation to the ovaries starting in December 2003. 6.  On Femara  7.  Rising tumor markers.  CT scan is positive for liver metastases.  And lung metastasesBiopsies positive for metastatic disease (liver biopsy) consistent with breast primary. November, 2014 [place] and progesterone receptor positive.  HER-2 receptor negative.. 8.  Abraxene starting  from September 03, 2013.  9.Patient was taken off Abraxane because of progressive side effect and started on ERIBULIN April 08, 2014    10.  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, 2016) Patient started on intrathecal methotrexate October 2 016    Oncology Flowsheet 08/03/2015 08/10/2015 08/17/2015 08/24/2015 09/10/2015 09/16/2015 09/23/2015  Day, Cycle Day 1, Cycle 1 cycle 2 , Cycle 1 Day 1, Cycle 2 cycle 2 , Cycle 2 Day 1, Cycle 3 cycle 2 , Cycle 3 Day 1, Cycle 4  dexamethasone (DECADRON) IV [ 10 mg ] [ 10 mg ] [ 10 mg ] [ 10 mg ] [ 10 mg ] [ 10 mg ] [ 10 mg ]  eriBULin mesylate (HALAVEN) IV - - - - - - -  LORazepam (ATIVAN) IV - - - - - - -  LORazepam (ATIVAN) PO - - - - - - -  methotrexate (PF) IT [ 12 mg ] [ 12 mg ] [  12 mg ] [ 12 mg ] [ 12 mg ] [ 12 mg ] [ 12 mg ]  ondansetron (ZOFRAN) IV [ 8 mg ] [ 8 mg ] [ 8 mg ] [ 8 mg ] [ 8 mg ] [ 8 mg ] [ 8 mg ]  pegfilgrastim (NEULASTA ONPRO KIT) [place] - - - - - - -  pegfilgrastim (NEULASTA) [place] - - - - - - -  prochlorperazine (COMPAZINE) IV - - - - - - -    INTERVAL HISTORY: 54 year old lady who has a 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  Patient is getting intrathecal chemotherapy Patient is here for continuation of intrathecal chemotherapy Continues to feel weak in both lower extremity but gradually gaining more strength Patient needs bedside commode and lift to lift her up from the bed Headache is improved patient is more conscious here for further follow-up and treatment consideration     REVIEW OF SYSTEMS:   GENERAL:  Patient is feeling weak and tired.  Frequent fall Low-grade fever.  Aches and pains PERFORMANCE STATUS (ECOG):  2 HEENT:  No visual changes, runny nose, sore throat, mouth sores or tenderness. Lungs: No shortness of breath or cough.  No hemoptysis. Cardiac:  No chest pain, palpitations, orthopnea, or PND. GI: intermittent  Nausea and vomiting. He should not complaints of  nausea and vomiting for last 2 days.  Epigastric discomfort.  Taking Nexium once a day GU:  No urgency, frequency, dysuria, or hematuria.June 09, 2015 Musculoskeletal:  No back pain.  No joint pain.  No muscle tenderness. Extremities:  No pain or swelling. Skin:  No rashes or skin changes. Neuro:  No headache, numbness or weakness, balance or coordination issues. Endocrine:  No diabetes, thyroid issues, hot flashes or night sweats. Psych:  No mood changes, depression or anxiety. Pain: Neck pain Review of systems:  All other systems reviewed and found to be negative. Epigastric discomfort which is persistent dull aching As per HPI. Otherwise, a complete review of systems is negatve.  PAST MEDICAL HISTORY: Past Medical History    Diagnosis Date  . Arthritis   . Diabetes mellitus without complication ([place])   . Colon polyps   . GERD (gastroesophageal reflux disease)   . Cancer ([place]) 2002     LEFT MASTECTOMY  . Breast cancer, female ([place]) 1997  . Shortness of breath dyspnea   . Headache     PAST SURGICAL HISTORY: Past Surgical History  Procedure Laterality Date  . Mastectomy Left 2002  . Colonoscopy  [id number]    [email or web address]  . Foot surgery Right   . Gastric endoscopy   2002  . Bone marrow transplant    . Cholecystectomy  1995  . Sigmoidoscopy  2013    FAMILY HISTORY Family History  Problem Relation Age of Onset  . Colon cancer Sister   . COPD Mother       ADVANCED DIRECTIVES:  Patient does   Not have advanced health care directive Patient does not have any advanced healthcare directive. Information has been given.     HEALTH MAINTENANCE: Social History  Substance Use Topics  . Smoking status: Never Smoker   . Smokeless tobacco: Never Used  . Alcohol Use: No      Allergies  Allergen Reactions  . Codeine Hives and Nausea Only    Other Reaction: GI Upset  . Latex Itching and Hives  . Shellfish Allergy Swelling    Lips    Current Outpatient Prescriptions  Medication Sig Dispense Refill  . Ascorbic Acid (VITAMIN C) 1000 MG tablet Take 1,000 mg by mouth daily.    . Calcium Carb-Cholecalciferol 600-400 MG-UNIT TABS Take by mouth.    . calcium carbonate (OS-CAL - DOSED IN MG OF ELEMENTAL CALCIUM) 1250 MG tablet Take 1 tablet by mouth daily.    Marland Kitchen dexamethasone (DECADRON) 4 MG tablet Take 1 tablet (4 mg total) by mouth daily. Take 2 tabs PO daily x 15 days, then take 1 tab daily x 15 days 45 tablet 0  . esomeprazole (NEXIUM) 40 MG capsule Take 40 mg by mouth 2 (two) times daily before a meal.     . fentaNYL (DURAGESIC - DOSED MCG/HR) 25 MCG/HR patch Place 1 patch (25 mcg total) onto the skin every 3 (three) days. 10 patch 0  . ferrous sulfate 325 (65 FE) MG tablet Take by mouth.    .  furosemide (LASIX) 20 MG tablet TAKE 1 TABLET EVERY DAY 30 tablet 4  . glipiZIDE (GLUCOTROL) 5 MG tablet TAKE 1 TABLET BY MOUTH 30 MINUTES BEFORE A MEAL TWICE A DAY 60 tablet 1  . KLOR-CON 10 10 MEQ tablet TAKE 1 TABLET (10 MEQ TOTAL) BY MOUTH 2 (TWO) TIMES DAILY. 60 tablet 3  . lactulose (CHRONULAC) 10 GM/15ML solution Take 15 mLs (10 g total) by mouth 2 (two) times daily  as needed for mild constipation. 240 mL 0  . lidocaine-prilocaine (EMLA) cream   3  . loratadine (CLARITIN) 10 MG tablet Take 10 mg by mouth daily.    Marland Kitchen LORazepam (ATIVAN) 0.5 MG tablet Take 1 tablet (0.5 mg total) by mouth 3 (three) times daily as needed for anxiety. 90 tablet 3  . meloxicam (MOBIC) 15 MG tablet Take 15 mg by mouth daily.    Marland Kitchen OLANZapine (ZYPREXA) 7.5 MG tablet TAKE 1 TABLET (7.5 MG TOTAL) BY MOUTH AT BEDTIME. 30 tablet 0  . ondansetron (ZOFRAN) 4 MG tablet TAKE 1 TABLET (4 MG TOTAL) BY MOUTH EVERY 6 (SIX) HOURS AS NEEDED FOR NAUSEA OR VOMITING. 30 tablet 3  . oxyCODONE-acetaminophen (PERCOCET) 10-325 MG tablet Take 1 tablet by mouth every 4 (four) hours as needed for pain. 60 tablet 0  . PARoxetine (PAXIL) 40 MG tablet TAKE 1 TABLET ONCE DAILY 30 tablet 3  . polyethylene glycol powder (GLYCOLAX/MIRALAX) powder 255 grams one bottle for colonoscopy prep 255 g 0  . promethazine (PHENERGAN) 25 MG suppository Place 1 suppository (25 mg total) rectally every 6 (six) hours as needed for nausea. 12 suppository 1  . spironolactone (ALDACTONE) 100 MG tablet Take 1 tablet (100 mg total) by mouth daily. 30 tablet 6  . sulfamethoxazole-trimethoprim (BACTRIM DS,SEPTRA DS) 800-160 MG per tablet Take 1 tablet by mouth 2 (two) times daily. 4 tablet 0  . vitamin E 400 UNIT capsule Take 400 Units by mouth daily.     No current facility-administered medications for this visit.   Facility-Administered Medications Ordered in Other Visits  Medication Dose Route Frequency Provider Last Rate Last Dose  . 0.9 %  sodium chloride  infusion   Intravenous Continuous Forest Gleason, MD 10 mL/hr at 09/10/15 0940    . 0.9 %  sodium chloride infusion   Intravenous Continuous Forest Gleason, MD 10 mL/hr at 09/23/15 1000    . heparin lock flush 100 unit/mL  500 Units Intravenous Once Forest Gleason, MD   500 Units at 09/10/15 1254  . heparin lock flush 100 unit/mL  500 Units Intravenous Once Forest Gleason, MD      . sodium chloride 0.9 % injection 10 mL  10 mL Intracatheter PRN Forest Gleason, MD   10 mL at 02/18/15 1453  . sodium chloride 0.9 % injection 10 mL  10 mL Intracatheter PRN Forest Gleason, MD   10 mL at 08/10/15 1400  . sodium chloride 0.9 % injection 10 mL  10 mL Intravenous PRN Forest Gleason, MD   10 mL at 09/16/15 0900    OBJECTIVE:  Filed Vitals:   09/23/15 1117 09/23/15 1118  BP: 121/81   Pulse: 125   Temp: 96.3 F (35.7 C)   Resp:  18     There is no weight on file to calculate BMI.    ECOG FS:2 - Symptomatic, <50% confined to bed  PHYSICAL EXAM: GENERAL:  Patient is in wheelchair. MENTAL STATUS: cc slightly more alert oriented and conversational. HEAD:alopecia i.  Normocephalic, atraumatic, face symmetric, no Cushingoid features. EYES:  Pupils equal round and reactive to light and accomodation.  No conjunctivitis or scleral icterus. ENT:  Oropharynx clear without lesion.  Tongue normal. Mucous membranes moist.  RESPIRATORY:  Clear to auscultation without rales, wheezes or rhonchi. CARDIOVASCULAR:  Regular rate and rhythm without murmur, rub or gallop. Abdomen: Tenderness in epigastric area.  Liver is not palpable.  No ascites. Oh palpable mass BACK:  No CVA tenderness.  No tenderness  on percussion of the back or rib cage. SKIN:  No rashes, ulcers or lesions. EXTREMITIES: No edema, no skin discoloration or tenderness.  No palpable cords. LYMPH NODES: No palpable cervical, supraclavicular, axillary or inguinal adenopathy  NEUROLOGICAL:higher functions improvement.  Weakness in both lower extremity.  PSYCH:   Appropriate. Neurological system: 6 nerve  palsy on the right side.  Possibility of right 7 nerve  Palsy Weakness in both lower extremity.  Complete examination is difficult.                    LAB RESULTS:  Infusion on 09/23/2015  Component Date Value Ref Range Status  . WBC 09/23/2015 6.2  3.6 - 11.0 K/uL Final   A-LINE DRAW  . RBC 09/23/2015 4.20  3.80 - 5.20 MIL/uL Final  . Hemoglobin 09/23/2015 12.7  12.0 - 16.0 g/dL Final  . HCT 09/23/2015 38.0  35.0 - 47.0 % Final  . MCV 09/23/2015 90.6  80.0 - 100.0 fL Final  . MCH 09/23/2015 30.3  26.0 - 34.0 pg Final  . MCHC 09/23/2015 33.5  32.0 - 36.0 g/dL Final  . RDW 09/23/2015 18.3* 11.5 - 14.5 % Final  . Platelets 09/23/2015 40* 150 - 440 K/uL Final  . Neutrophils Relative % 09/23/2015 64   Final  . Neutro Abs 09/23/2015 4.0  1.4 - 6.5 K/uL Final  . Lymphocytes Relative 09/23/2015 27   Final  . Lymphs Abs 09/23/2015 1.7  1.0 - 3.6 K/uL Final  . Monocytes Relative 09/23/2015 3   Final  . Monocytes Absolute 09/23/2015 0.2  0.2 - 0.9 K/uL Final  . Eosinophils Relative 09/23/2015 5   Final  . Eosinophils Absolute 09/23/2015 0.3  0 - 0.7 K/uL Final  . Basophils Relative 09/23/2015 1   Final  . Basophils Absolute 09/23/2015 0.0  0 - 0.1 K/uL Final  . Sodium 09/23/2015 133* 135 - 145 mmol/L Final  . Potassium 09/23/2015 3.4* 3.5 - 5.1 mmol/L Final  . Chloride 09/23/2015 99* 101 - 111 mmol/L Final  . CO2 09/23/2015 27  22 - 32 mmol/L Final  . Glucose, Bld 09/23/2015 134* 65 - 99 mg/dL Final  . BUN 09/23/2015 14  6 - 20 mg/dL Final  . Creatinine, Ser 09/23/2015 0.41* 0.44 - 1.00 mg/dL Final  . Calcium 09/23/2015 8.6* 8.9 - 10.3 mg/dL Final  . Total Protein 09/23/2015 5.3* 6.5 - 8.1 g/dL Final  . Albumin 09/23/2015 3.0* 3.5 - 5.0 g/dL Final  . AST 09/23/2015 22  15 - 41 U/L Final  . ALT 09/23/2015 49  14 - 54 U/L Final  . Alkaline Phosphatase 09/23/2015 73  38 - 126 U/L Final  . Total Bilirubin 09/23/2015 1.3* 0.3 - 1.2 mg/dL Final  .  GFR calc non Af Amer 09/23/2015 >60  >60 mL/min Final  . GFR calc Af Amer 09/23/2015 >60  >60 mL/min Final   Comment: (NOTE) The eGFR has been calculated using the CKD EPI equation. This calculation has not been validated in all clinical situations. eGFR's persistently <60 mL/min signify possible Chronic Kidney Disease.   . Anion gap 09/23/2015 7  5 - 15 Final    Lab Results  Component Value Date   LABCA2 26.0 08/03/2015   .   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 Continue intrathecal chemotherapy Persistent nausea is improving Will proceed with intrathecal methotrexate All lab data has been reviewed Procedure note omaya reservoir site has been cleaned with Betadine and later on with alcohol  With Huber needle 10 cc of CSF was removed.  It was clear. 12 mg of methotrexate was injected intrathecally Patient had received Decadron and Zofran prior to administration    Breast cancer metastasized to liver   Staging form: Breast, AJCC 7th Edition     Clinical: Stage IV (T2, N3, M1) - Signed by Forest Gleason, MD on 02/18/2015   Forest Gleason, MD   09/23/2015 1:42 PM

## 2015-09-25 ENCOUNTER — Telehealth: Payer: Self-pay | Admitting: *Deleted

## 2015-09-25 NOTE — Telephone Encounter (Signed)
faxed

## 2015-09-30 ENCOUNTER — Inpatient Hospital Stay: Payer: Managed Care, Other (non HMO)

## 2015-09-30 ENCOUNTER — Inpatient Hospital Stay (HOSPITAL_BASED_OUTPATIENT_CLINIC_OR_DEPARTMENT_OTHER): Payer: Managed Care, Other (non HMO) | Admitting: Oncology

## 2015-09-30 ENCOUNTER — Encounter: Payer: Self-pay | Admitting: Oncology

## 2015-09-30 VITALS — BP 98/75 | HR 112 | Temp 97.0°F | Resp 18

## 2015-09-30 DIAGNOSIS — Z9012 Acquired absence of left breast and nipple: Secondary | ICD-10-CM

## 2015-09-30 DIAGNOSIS — E119 Type 2 diabetes mellitus without complications: Secondary | ICD-10-CM

## 2015-09-30 DIAGNOSIS — Z7984 Long term (current) use of oral hypoglycemic drugs: Secondary | ICD-10-CM

## 2015-09-30 DIAGNOSIS — C7932 Secondary malignant neoplasm of cerebral meninges: Secondary | ICD-10-CM | POA: Diagnosis not present

## 2015-09-30 DIAGNOSIS — C7949 Secondary malignant neoplasm of other parts of nervous system: Secondary | ICD-10-CM

## 2015-09-30 DIAGNOSIS — R531 Weakness: Secondary | ICD-10-CM

## 2015-09-30 DIAGNOSIS — C7951 Secondary malignant neoplasm of bone: Secondary | ICD-10-CM | POA: Diagnosis not present

## 2015-09-30 DIAGNOSIS — C50919 Malignant neoplasm of unspecified site of unspecified female breast: Secondary | ICD-10-CM

## 2015-09-30 DIAGNOSIS — C787 Secondary malignant neoplasm of liver and intrahepatic bile duct: Secondary | ICD-10-CM

## 2015-09-30 DIAGNOSIS — Z86010 Personal history of colonic polyps: Secondary | ICD-10-CM

## 2015-09-30 DIAGNOSIS — K219 Gastro-esophageal reflux disease without esophagitis: Secondary | ICD-10-CM

## 2015-09-30 DIAGNOSIS — Z79899 Other long term (current) drug therapy: Secondary | ICD-10-CM

## 2015-09-30 DIAGNOSIS — Z170 Estrogen receptor positive status [ER+]: Secondary | ICD-10-CM

## 2015-09-30 DIAGNOSIS — Z5111 Encounter for antineoplastic chemotherapy: Secondary | ICD-10-CM | POA: Diagnosis not present

## 2015-09-30 DIAGNOSIS — Z923 Personal history of irradiation: Secondary | ICD-10-CM

## 2015-09-30 DIAGNOSIS — Z9181 History of falling: Secondary | ICD-10-CM

## 2015-09-30 DIAGNOSIS — C7800 Secondary malignant neoplasm of unspecified lung: Secondary | ICD-10-CM

## 2015-09-30 DIAGNOSIS — M129 Arthropathy, unspecified: Secondary | ICD-10-CM

## 2015-09-30 DIAGNOSIS — R5383 Other fatigue: Secondary | ICD-10-CM

## 2015-09-30 DIAGNOSIS — C779 Secondary and unspecified malignant neoplasm of lymph node, unspecified: Secondary | ICD-10-CM

## 2015-09-30 DIAGNOSIS — R1011 Right upper quadrant pain: Secondary | ICD-10-CM

## 2015-09-30 DIAGNOSIS — C50912 Malignant neoplasm of unspecified site of left female breast: Secondary | ICD-10-CM

## 2015-09-30 DIAGNOSIS — Z800 Family history of malignant neoplasm of digestive organs: Secondary | ICD-10-CM

## 2015-09-30 DIAGNOSIS — R110 Nausea: Secondary | ICD-10-CM

## 2015-09-30 LAB — COMPREHENSIVE METABOLIC PANEL
ALT: 41 U/L (ref 14–54)
AST: 22 U/L (ref 15–41)
Albumin: 3 g/dL — ABNORMAL LOW (ref 3.5–5.0)
Alkaline Phosphatase: 86 U/L (ref 38–126)
Anion gap: 9 (ref 5–15)
BILIRUBIN TOTAL: 1.5 mg/dL — AB (ref 0.3–1.2)
BUN: 12 mg/dL (ref 6–20)
CHLORIDE: 95 mmol/L — AB (ref 101–111)
CO2: 25 mmol/L (ref 22–32)
CREATININE: 0.34 mg/dL — AB (ref 0.44–1.00)
Calcium: 8.7 mg/dL — ABNORMAL LOW (ref 8.9–10.3)
GFR calc Af Amer: >60 mL/min (ref >60)
Glucose, Bld: 125 mg/dL — ABNORMAL HIGH (ref 65–99)
Potassium: 3.3 mmol/L — ABNORMAL LOW (ref 3.5–5.1)
Sodium: 129 mmol/L — ABNORMAL LOW (ref 135–145)
TOTAL PROTEIN: 5.3 g/dL — AB (ref 6.5–8.1)

## 2015-09-30 LAB — CBC WITH DIFFERENTIAL/PLATELET
BASOS ABS: 0 10*3/uL (ref 0–0.1)
Basophils Relative: 1 %
Eosinophils Absolute: 0.2 10*3/uL (ref 0–0.7)
Eosinophils Relative: 6 %
HEMATOCRIT: 32.3 % — AB (ref 35.0–47.0)
HEMOGLOBIN: 11.2 g/dL — AB (ref 12.0–16.0)
LYMPHS PCT: 37 %
Lymphs Abs: 1.2 10*3/uL (ref 1.0–3.6)
MCH: 31.7 pg (ref 26.0–34.0)
MCHC: 34.5 g/dL (ref 32.0–36.0)
MCV: 92 fL (ref 80.0–100.0)
Monocytes Absolute: 0.2 10*3/uL (ref 0.2–0.9)
Monocytes Relative: 7 %
NEUTROS ABS: 1.6 10*3/uL (ref 1.4–6.5)
Neutrophils Relative %: 49 %
Platelets: 39 10*3/uL — ABNORMAL LOW (ref 150–440)
RBC: 3.51 MIL/uL — AB (ref 3.80–5.20)
RDW: 18.8 % — ABNORMAL HIGH (ref 11.5–14.5)
WBC: 3.2 10*3/uL — AB (ref 3.6–11.0)

## 2015-09-30 LAB — CSF CELL COUNT WITH DIFFERENTIAL
RBC COUNT CSF: 0 /mm3 (ref 0–3)
WBC, CSF: 0 /mm3

## 2015-09-30 LAB — PROTEIN, CSF: TOTAL PROTEIN, CSF: 7 mg/dL — AB (ref 15–45)

## 2015-09-30 MED ORDER — SENNOSIDES-DOCUSATE SODIUM 8.6-50 MG PO TABS: 1.0000 | ORAL_TABLET | Freq: Two times a day (BID) | ORAL | Status: AC

## 2015-09-30 MED ORDER — HEPARIN SOD (PORK) LOCK FLUSH 100 UNIT/ML IV SOLN
500.0000 [IU] | Freq: Once | INTRAVENOUS | Status: AC
  Administered 2015-09-30: 500 [IU] via INTRAVENOUS

## 2015-09-30 MED ORDER — SODIUM CHLORIDE 0.9 % IV SOLN
Freq: Once | INTRAVENOUS | Status: AC
  Administered 2015-09-30: 10:00:00 via INTRAVENOUS
  Filled 2015-09-30: qty 4

## 2015-09-30 MED ORDER — SODIUM CHLORIDE 0.9 % IJ SOLN
Freq: Once | INTRAMUSCULAR | Status: AC
  Administered 2015-09-30: 12:00:00 via INTRATHECAL
  Filled 2015-09-30: qty 0.48

## 2015-09-30 NOTE — Progress Notes (Signed)
[place] @ [facility] [phone]  Fax:([phone]) [place] OB: 25-Jun-1961  MR#: [id number]  CBJ#:[id number]  Patient Care Team: Pcp Not In System as PCP - General Robert Bellow, MD (General Surgery)  CHIEF COMPLAINT:  Chief Complaint  Patient presents with  . Breast Cancer    Chief Complaint/Problem List  1.  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 Status post Cytoxan, Adriamycin, followed by high dose chemotherapy and stem cell support.  Status post radiation therapy. 1996 2.  Was on Tamoxifen therapy.  3.  Progressive disease with the left supraclavicular lymph node positive on needle aspiration.  4.  Tamoxifen has been discontinued.  5.  Ovarian ablation with Lupron, Femara and radiation to the ovaries starting in December 2003. 6.  On Femara  7.  Rising tumor markers.  CT scan is positive for liver metastases.  And lung metastasesBiopsies positive for metastatic disease (liver biopsy) consistent with breast primary. November, 2014 [place] and progesterone receptor positive.  HER-2 receptor negative.. 8.  Abraxene starting  from September 03, 2013.  9.Patient was taken off Abraxane because of progressive side effect and started on ERIBULIN April 08, 2014    10.  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, 2016) Patient started on intrathecal methotrexate October 2 016    Oncology Flowsheet 08/10/2015 08/17/2015 08/24/2015 09/10/2015 09/16/2015 09/23/2015 09/30/2015  Day, Cycle cycle 2 , Cycle 1 Day 1, Cycle 2 cycle 2 , Cycle 2 Day 1, Cycle 3 cycle 2 , Cycle 3 Day 1, Cycle 4 cycle 2 , Cycle 4  dexamethasone (DECADRON) IV [ 10 mg ] [ 10 mg ] [ 10 mg ] [ 10 mg ] [ 10 mg ] [ 10 mg ] [ 10 mg ]  eriBULin mesylate (HALAVEN) IV - - - - - - -  LORazepam (ATIVAN) IV - - - - - - -  LORazepam (ATIVAN) PO - - - - - - -  methotrexate (PF) IT [ 12 mg ] [ 12 mg  ] [ 12 mg ] [ 12 mg ] [ 12 mg ] [ 12 mg ] [ 12 mg ]  ondansetron (ZOFRAN) IV [ 8 mg ] [ 8 mg ] [ 8 mg ] [ 8 mg ] [ 8 mg ] [ 8 mg ] [ 8 mg ]  pegfilgrastim (NEULASTA ONPRO KIT) [place] - - - - - - -  pegfilgrastim (NEULASTA) [place] - - - - - - -  prochlorperazine (COMPAZINE) IV - - - - - - -    INTERVAL HISTORY: 54 year old lady who has a 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  Nausea has improved according to family patient is gaining some strength in lower extremity but ambulation remains very poor. Patient is here for continuation of intrathecal chemotherapy  weakness in both lower extremity   no nausea no vomiting    REVIEW OF SYSTEMS:   GENERAL:  Patient is feeling weak and tired.  Frequent fall Low-grade fever.  Aches and pains PERFORMANCE STATUS (ECOG):  2 HEENT:  No visual changes, runny nose, sore throat, mouth sores or tenderness. Lungs: No shortness of breath or cough.  No hemoptysis. Cardiac:  No chest pain, palpitations, orthopnea, or PND. GI: intermittent  Nausea and vomiting. He should not complaints of nausea and vomiting for last 2 days.  Epigastric discomfort.  Taking Nexium once a day GU:  No urgency, frequency, dysuria, or hematuria.June 09, 2015 Musculoskeletal:  No back pain.  No joint pain.  No muscle tenderness. Extremities:  No pain or swelling. Skin:  No rashes or skin changes. Neuro:   Weakness in both lower extremity.  Ataxia.  [place] and tone is decreased to 1 /5 in both lower extremity  Cranial nodes are intact Endocrine:  No diabetes, thyroid issues, hot flashes or night sweats. Psych:  No mood changes, depression or anxiety. Pain: Neck pain Review of systems:  All other systems reviewed and found to be negative. Epigastric discomfort which is persistent dull aching As per HPI. Otherwise, a complete review of systems is negatve.  PAST MEDICAL HISTORY: Past Medical History  Diagnosis Date  . Arthritis    . Diabetes mellitus without complication ([place])   . Colon polyps   . GERD (gastroesophageal reflux disease)   . Cancer ([place]) 2002     LEFT MASTECTOMY  . Breast cancer, female ([place]) 1997  . Shortness of breath dyspnea   . Headache     PAST SURGICAL HISTORY: Past Surgical History  Procedure Laterality Date  . Mastectomy Left 2002  . Colonoscopy  [id number]    [email or web address]  . Foot surgery Right   . Gastric endoscopy   2002  . Bone marrow transplant    . Cholecystectomy  1995  . Sigmoidoscopy  2013    FAMILY HISTORY Family History  Problem Relation Age of Onset  . Colon cancer Sister   . COPD Mother       ADVANCED DIRECTIVES:  Patient does   Not have advanced health care directive Patient does not have any advanced healthcare directive. Information has been given.     HEALTH MAINTENANCE: Social History  Substance Use Topics  . Smoking status: Never Smoker   . Smokeless tobacco: Never Used  . Alcohol Use: No      Allergies  Allergen Reactions  . Codeine Hives and Nausea Only    Other Reaction: GI Upset  . Latex Itching and Hives  . Shellfish Allergy Swelling    Lips    Current Outpatient Prescriptions  Medication Sig Dispense Refill  . Ascorbic Acid (VITAMIN C) 1000 MG tablet Take 1,000 mg by mouth daily.    . Calcium Carb-Cholecalciferol 600-400 MG-UNIT TABS Take by mouth.    . calcium carbonate (OS-CAL - DOSED IN MG OF ELEMENTAL CALCIUM) 1250 MG tablet Take 1 tablet by mouth daily.    Marland Kitchen dexamethasone (DECADRON) 4 MG tablet Take 1 tablet (4 mg total) by mouth daily. Take 2 tabs PO daily x 15 days, then take 1 tab daily x 15 days 45 tablet 0  . esomeprazole (NEXIUM) 40 MG capsule Take 40 mg by mouth 2 (two) times daily before a meal.     . fentaNYL (DURAGESIC - DOSED MCG/HR) 25 MCG/HR patch Place 1 patch (25 mcg total) onto the skin every 3 (three) days. 10 patch 0  . ferrous sulfate 325 (65 FE) MG tablet Take by mouth.    . furosemide (LASIX) 20 MG tablet  TAKE 1 TABLET EVERY DAY 30 tablet 4  . glipiZIDE (GLUCOTROL) 5 MG tablet TAKE 1 TABLET BY MOUTH 30 MINUTES BEFORE A MEAL TWICE A DAY 60 tablet 1  . KLOR-CON 10 10 MEQ tablet TAKE 1 TABLET (10 MEQ TOTAL) BY MOUTH 2 (TWO) TIMES DAILY. 60 tablet 3  . lactulose (CHRONULAC) 10 GM/15ML solution Take 15 mLs (10 g total) by  mouth 2 (two) times daily as needed for mild constipation. 240 mL 0  . lidocaine-prilocaine (EMLA) cream   3  . loratadine (CLARITIN) 10 MG tablet Take 10 mg by mouth daily.    Marland Kitchen LORazepam (ATIVAN) 0.5 MG tablet Take 1 tablet (0.5 mg total) by mouth 3 (three) times daily as needed for anxiety. 90 tablet 3  . meloxicam (MOBIC) 15 MG tablet Take 15 mg by mouth daily.    Marland Kitchen OLANZapine (ZYPREXA) 7.5 MG tablet TAKE 1 TABLET (7.5 MG TOTAL) BY MOUTH AT BEDTIME. 30 tablet 0  . ondansetron (ZOFRAN) 4 MG tablet TAKE 1 TABLET (4 MG TOTAL) BY MOUTH EVERY 6 (SIX) HOURS AS NEEDED FOR NAUSEA OR VOMITING. 30 tablet 3  . oxyCODONE-acetaminophen (PERCOCET) 10-325 MG tablet Take 1 tablet by mouth every 4 (four) hours as needed for pain. 60 tablet 0  . PARoxetine (PAXIL) 40 MG tablet TAKE 1 TABLET ONCE DAILY 30 tablet 3  . polyethylene glycol powder (GLYCOLAX/MIRALAX) powder 255 grams one bottle for colonoscopy prep 255 g 0  . promethazine (PHENERGAN) 25 MG suppository Place 1 suppository (25 mg total) rectally every 6 (six) hours as needed for nausea. 12 suppository 1  . spironolactone (ALDACTONE) 100 MG tablet Take 1 tablet (100 mg total) by mouth daily. 30 tablet 6  . sulfamethoxazole-trimethoprim (BACTRIM DS,SEPTRA DS) 800-160 MG per tablet Take 1 tablet by mouth 2 (two) times daily. 4 tablet 0  . vitamin E 400 UNIT capsule Take 400 Units by mouth daily.    Marland Kitchen senna-docusate (SENNA S) 8.6-50 MG tablet Take 1 tablet by mouth 2 (two) times daily. 60 tablet 3   No current facility-administered medications for this visit.   Facility-Administered Medications Ordered in Other Visits  Medication Dose Route  Frequency Provider Last Rate Last Dose  . 0.9 %  sodium chloride infusion   Intravenous Continuous Forest Gleason, MD 10 mL/hr at 09/10/15 0940    . heparin lock flush 100 unit/mL  500 Units Intravenous Once Forest Gleason, MD   500 Units at 09/10/15 1254  . sodium chloride 0.9 % injection 10 mL  10 mL Intracatheter PRN Forest Gleason, MD   10 mL at 02/18/15 1453  . sodium chloride 0.9 % injection 10 mL  10 mL Intracatheter PRN Forest Gleason, MD   10 mL at 08/10/15 1400  . sodium chloride 0.9 % injection 10 mL  10 mL Intravenous PRN Forest Gleason, MD   10 mL at 09/16/15 0900    OBJECTIVE:  Filed Vitals:   09/30/15 1113  BP: 98/75  Pulse: 112  Temp: 97 F (36.1 C)  Resp: 18     There is no weight on file to calculate BMI.    ECOG FS:2 - Symptomatic, <50% confined to bed  PHYSICAL EXAM: GENERAL:  Patient is in wheelchair. MENTAL STATUS: cc slightly more alert oriented and conversational. HEAD:alopecia i.  Normocephalic, atraumatic, face symmetric, no Cushingoid features. EYES:  Pupils equal round and reactive to light and accomodation.  No conjunctivitis or scleral icterus. ENT:  Oropharynx clear without lesion.  Tongue normal. Mucous membranes moist.  RESPIRATORY:  Clear to auscultation without rales, wheezes or rhonchi. CARDIOVASCULAR:  Regular rate and rhythm without murmur, rub or gallop. Abdomen: Tenderness in epigastric area.  Liver is not palpable.  No ascites. Oh palpable mass BACK:  No CVA tenderness.  No tenderness on percussion of the back or rib cage. SKIN:  No rashes, ulcers or lesions. EXTREMITIES: No edema, no skin discoloration or  tenderness.  No palpable cords. LYMPH NODES: No palpable cervical, supraclavicular, axillary or inguinal adenopathy  NEUROLOGICAL:higher functions improvement.  Weakness in both lower extremity.  PSYCH:  Appropriate. Neurological system: 6 nerve  palsy on the right side.  Possibility of right 7 nerve  Palsy Weakness in both lower extremity.   Complete examination is difficult.                    LAB RESULTS:  Infusion on 09/30/2015  Component Date Value Ref Range Status  . WBC 09/30/2015 3.2* 3.6 - 11.0 K/uL Final  . RBC 09/30/2015 3.51* 3.80 - 5.20 MIL/uL Final  . Hemoglobin 09/30/2015 11.2* 12.0 - 16.0 g/dL Final  . HCT 09/30/2015 32.3* 35.0 - 47.0 % Final  . MCV 09/30/2015 92.0  80.0 - 100.0 fL Final  . MCH 09/30/2015 31.7  26.0 - 34.0 pg Final  . MCHC 09/30/2015 34.5  32.0 - 36.0 g/dL Final  . RDW 09/30/2015 18.8* 11.5 - 14.5 % Final  . Platelets 09/30/2015 39* 150 - 440 K/uL Final  . Neutrophils Relative % 09/30/2015 49   Final  . Neutro Abs 09/30/2015 1.6  1.4 - 6.5 K/uL Final  . Lymphocytes Relative 09/30/2015 37   Final  . Lymphs Abs 09/30/2015 1.2  1.0 - 3.6 K/uL Final  . Monocytes Relative 09/30/2015 7   Final  . Monocytes Absolute 09/30/2015 0.2  0.2 - 0.9 K/uL Final  . Eosinophils Relative 09/30/2015 6   Final  . Eosinophils Absolute 09/30/2015 0.2  0 - 0.7 K/uL Final  . Basophils Relative 09/30/2015 1   Final  . Basophils Absolute 09/30/2015 0.0  0 - 0.1 K/uL Final  . Sodium 09/30/2015 129* 135 - 145 mmol/L Final  . Potassium 09/30/2015 3.3* 3.5 - 5.1 mmol/L Final  . Chloride 09/30/2015 95* 101 - 111 mmol/L Final  . CO2 09/30/2015 25  22 - 32 mmol/L Final  . Glucose, Bld 09/30/2015 125* 65 - 99 mg/dL Final  . BUN 09/30/2015 12  6 - 20 mg/dL Final  . Creatinine, Ser 09/30/2015 0.34* 0.44 - 1.00 mg/dL Final  . Calcium 09/30/2015 8.7* 8.9 - 10.3 mg/dL Final  . Total Protein 09/30/2015 5.3* 6.5 - 8.1 g/dL Final  . Albumin 09/30/2015 3.0* 3.5 - 5.0 g/dL Final  . AST 09/30/2015 22  15 - 41 U/L Final  . ALT 09/30/2015 41  14 - 54 U/L Final  . Alkaline Phosphatase 09/30/2015 86  38 - 126 U/L Final  . Total Bilirubin 09/30/2015 1.5* 0.3 - 1.2 mg/dL Final  . GFR calc non Af Amer 09/30/2015 >60  >60 mL/min Final  . GFR calc Af Amer 09/30/2015 >60  >60 mL/min Final   Comment: (NOTE) The eGFR has been calculated  using the CKD EPI equation. This calculation has not been validated in all clinical situations. eGFR's persistently <60 mL/min signify possible Chronic Kidney Disease.   . Anion gap 09/30/2015 9  5 - 15 Final    Lab Results  Component Value Date   LABCA2 26.0 08/03/2015   .   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 Continue intrathecal chemotherapy Persistent nausea is improving Will proceed with intrathecal methotrexate All lab data has been reviewed Procedure note omaya reservoir site has been cleaned with Betadine and later on with alcohol With Huber needle 10 cc of CSF was removed.  It was clear. 12 mg of methotrexate was injected intrathecally Patient had received Decadron and Zofran prior to administration  regarding systemic therapy be mailed to consider possibility of anti-hormonal treatment with Aromasin and IBRANCE.  Which will be considered next time  We will reevaluate patient in 2 weeks  Breast cancer metastasized to liver   Staging form: Breast, AJCC 7th Edition     Clinical: Stage IV (T2, N3, M1) - Signed by Forest Gleason, MD on 02/18/2015   Forest Gleason, MD   09/30/2015 12:43 PM

## 2015-10-01 ENCOUNTER — Other Ambulatory Visit: Payer: Self-pay | Admitting: *Deleted

## 2015-10-01 DIAGNOSIS — C50919 Malignant neoplasm of unspecified site of unspecified female breast: Secondary | ICD-10-CM

## 2015-10-01 DIAGNOSIS — C787 Secondary malignant neoplasm of liver and intrahepatic bile duct: Principal | ICD-10-CM

## 2015-10-14 ENCOUNTER — Inpatient Hospital Stay: Payer: Managed Care, Other (non HMO)

## 2015-10-14 ENCOUNTER — Inpatient Hospital Stay (HOSPITAL_BASED_OUTPATIENT_CLINIC_OR_DEPARTMENT_OTHER): Payer: Managed Care, Other (non HMO) | Admitting: Oncology

## 2015-10-14 ENCOUNTER — Ambulatory Visit: Payer: Managed Care, Other (non HMO)

## 2015-10-14 ENCOUNTER — Inpatient Hospital Stay: Payer: Managed Care, Other (non HMO) | Attending: Oncology

## 2015-10-14 ENCOUNTER — Encounter: Payer: Self-pay | Admitting: Oncology

## 2015-10-14 VITALS — BP 144/83 | HR 144 | Temp 98.0°F | Resp 18

## 2015-10-14 DIAGNOSIS — R630 Anorexia: Secondary | ICD-10-CM | POA: Insufficient documentation

## 2015-10-14 DIAGNOSIS — R5383 Other fatigue: Secondary | ICD-10-CM

## 2015-10-14 DIAGNOSIS — Z79899 Other long term (current) drug therapy: Secondary | ICD-10-CM

## 2015-10-14 DIAGNOSIS — Z79810 Long term (current) use of selective estrogen receptor modulators (SERMs): Secondary | ICD-10-CM | POA: Diagnosis not present

## 2015-10-14 DIAGNOSIS — M129 Arthropathy, unspecified: Secondary | ICD-10-CM | POA: Insufficient documentation

## 2015-10-14 DIAGNOSIS — R531 Weakness: Secondary | ICD-10-CM

## 2015-10-14 DIAGNOSIS — Z800 Family history of malignant neoplasm of digestive organs: Secondary | ICD-10-CM | POA: Diagnosis not present

## 2015-10-14 DIAGNOSIS — E119 Type 2 diabetes mellitus without complications: Secondary | ICD-10-CM

## 2015-10-14 DIAGNOSIS — K219 Gastro-esophageal reflux disease without esophagitis: Secondary | ICD-10-CM

## 2015-10-14 DIAGNOSIS — C787 Secondary malignant neoplasm of liver and intrahepatic bile duct: Principal | ICD-10-CM

## 2015-10-14 DIAGNOSIS — Z923 Personal history of irradiation: Secondary | ICD-10-CM

## 2015-10-14 DIAGNOSIS — Z86010 Personal history of colonic polyps: Secondary | ICD-10-CM | POA: Insufficient documentation

## 2015-10-14 DIAGNOSIS — C7932 Secondary malignant neoplasm of cerebral meninges: Secondary | ICD-10-CM | POA: Diagnosis not present

## 2015-10-14 DIAGNOSIS — C7800 Secondary malignant neoplasm of unspecified lung: Secondary | ICD-10-CM

## 2015-10-14 DIAGNOSIS — Z7984 Long term (current) use of oral hypoglycemic drugs: Secondary | ICD-10-CM | POA: Insufficient documentation

## 2015-10-14 DIAGNOSIS — C7951 Secondary malignant neoplasm of bone: Secondary | ICD-10-CM | POA: Diagnosis not present

## 2015-10-14 DIAGNOSIS — R51 Headache: Secondary | ICD-10-CM

## 2015-10-14 DIAGNOSIS — C7949 Secondary malignant neoplasm of other parts of nervous system: Secondary | ICD-10-CM

## 2015-10-14 DIAGNOSIS — Z9012 Acquired absence of left breast and nipple: Secondary | ICD-10-CM

## 2015-10-14 DIAGNOSIS — R0602 Shortness of breath: Secondary | ICD-10-CM | POA: Insufficient documentation

## 2015-10-14 DIAGNOSIS — F329 Major depressive disorder, single episode, unspecified: Secondary | ICD-10-CM

## 2015-10-14 DIAGNOSIS — C50919 Malignant neoplasm of unspecified site of unspecified female breast: Secondary | ICD-10-CM

## 2015-10-14 DIAGNOSIS — C50912 Malignant neoplasm of unspecified site of left female breast: Secondary | ICD-10-CM | POA: Insufficient documentation

## 2015-10-14 LAB — COMPREHENSIVE METABOLIC PANEL WITH GFR
ALT: 11 U/L — ABNORMAL LOW (ref 14–54)
AST: 12 U/L — ABNORMAL LOW (ref 15–41)
Albumin: 3.2 g/dL — ABNORMAL LOW (ref 3.5–5.0)
Alkaline Phosphatase: 67 U/L (ref 38–126)
Anion gap: 7 (ref 5–15)
BUN: 9 mg/dL (ref 6–20)
CO2: 28 mmol/L (ref 22–32)
Calcium: 8.9 mg/dL (ref 8.9–10.3)
Chloride: 98 mmol/L — ABNORMAL LOW (ref 101–111)
Creatinine, Ser: 0.4 mg/dL — ABNORMAL LOW (ref 0.44–1.00)
GFR calc Af Amer: >60 mL/min
GFR calc non Af Amer: >60 mL/min
Glucose, Bld: 111 mg/dL — ABNORMAL HIGH (ref 65–99)
Potassium: 3.3 mmol/L — ABNORMAL LOW (ref 3.5–5.1)
Sodium: 133 mmol/L — ABNORMAL LOW (ref 135–145)
Total Bilirubin: 1.5 mg/dL — ABNORMAL HIGH (ref 0.3–1.2)
Total Protein: 5.4 g/dL — ABNORMAL LOW (ref 6.5–8.1)

## 2015-10-14 LAB — CBC WITH DIFFERENTIAL/PLATELET
Basophils Absolute: 0.1 10*3/uL (ref 0–0.1)
Basophils Relative: 1 %
Eosinophils Absolute: 0.2 10*3/uL (ref 0–0.7)
Eosinophils Relative: 3 %
HCT: 34.9 % — ABNORMAL LOW (ref 35.0–47.0)
Hemoglobin: 11.6 g/dL — ABNORMAL LOW (ref 12.0–16.0)
Lymphocytes Relative: 22 %
Lymphs Abs: 1.2 10*3/uL (ref 1.0–3.6)
MCH: 31.9 pg (ref 26.0–34.0)
MCHC: 33.2 g/dL (ref 32.0–36.0)
MCV: 96.1 fL (ref 80.0–100.0)
Monocytes Absolute: 0.8 10*3/uL (ref 0.2–0.9)
Monocytes Relative: 15 %
Neutro Abs: 3.2 10*3/uL (ref 1.4–6.5)
Neutrophils Relative %: 59 %
Platelets: 179 10*3/uL (ref 150–440)
RBC: 3.64 MIL/uL — ABNORMAL LOW (ref 3.80–5.20)
RDW: 22.3 % — ABNORMAL HIGH (ref 11.5–14.5)
WBC: 5.4 10*3/uL (ref 3.6–11.0)

## 2015-10-14 MED ORDER — SODIUM CHLORIDE 0.9 % IV SOLN
Freq: Once | INTRAVENOUS | Status: DC
  Filled 2015-10-14: qty 4

## 2015-10-14 MED ORDER — HEPARIN SOD (PORK) LOCK FLUSH 100 UNIT/ML IV SOLN
500.0000 [IU] | Freq: Once | INTRAVENOUS | Status: AC
  Administered 2015-10-14: 500 [IU] via INTRAVENOUS
  Filled 2015-10-14: qty 5

## 2015-10-14 MED ORDER — SODIUM CHLORIDE 0.9 % IJ SOLN
10.0000 mL | INTRAMUSCULAR | Status: DC | PRN
  Administered 2015-10-14: 10 mL via INTRAVENOUS
  Filled 2015-10-14: qty 10

## 2015-10-14 MED ORDER — SODIUM CHLORIDE 0.9 % IJ SOLN
Freq: Once | INTRAMUSCULAR | Status: DC
  Filled 2015-10-14: qty 0.48

## 2015-10-14 NOTE — Progress Notes (Signed)
Patient did not want to come to appointment today.  Upset with family because they brought her.

## 2015-10-15 ENCOUNTER — Encounter: Payer: Self-pay | Admitting: Oncology

## 2015-10-15 LAB — CANCER ANTIGEN 27.29: CA 27.29: 32.4 U/mL (ref 0.0–38.6)

## 2015-10-15 NOTE — Progress Notes (Signed)
Paula Price @ [facility] [phone]  Fax:([phone]) Paula Price OB: October 11, 1960  MR#: [id number]  OAC#:[id number]  Patient Care Team: Pcp Not In System as PCP - General Robert Bellow, MD (General Surgery)  CHIEF COMPLAINT:  Chief Complaint  Patient presents with  . Breast Cancer    Chief Complaint/Problem List  1.  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 Status post Cytoxan, Adriamycin, followed by high dose chemotherapy and stem cell support.  Status post radiation therapy. 1996 2.  Was on Tamoxifen therapy.  3.  Progressive disease with the left supraclavicular lymph node positive on needle aspiration.  4.  Tamoxifen has been discontinued.  5.  Ovarian ablation with Lupron, Femara and radiation to the ovaries starting in December 2003. 6.  On Femara  7.  Rising tumor markers.  CT scan is positive for liver metastases.  And lung metastasesBiopsies positive for metastatic disease (liver biopsy) consistent with breast primary. November, 2014 [place] and progesterone receptor positive.  HER-2 receptor negative.. 8.  Abraxene starting  from September 03, 2013.  9.Patient was taken off Abraxane because of progressive side effect and started on ERIBULIN April 08, 2014    10.  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, 2016) Patient started on intrathecal methotrexate October 2 016    Oncology Flowsheet 08/10/2015 08/17/2015 08/24/2015 09/10/2015 09/16/2015 09/23/2015 09/30/2015  Day, Cycle cycle 2 , Cycle 1 Day 1, Cycle 2 cycle 2 , Cycle 2 Day 1, Cycle 3 cycle 2 , Cycle 3 Day 1, Cycle 4 cycle 2 , Cycle 4  dexamethasone (DECADRON) IV [ 10 mg ] [ 10 mg ] [ 10 mg ] [ 10 mg ] [ 10 mg ] [ 10 mg ] [ 10 mg ]  eriBULin mesylate (HALAVEN) IV - - - - - - -  LORazepam (ATIVAN) IV - - - - - - -  LORazepam (ATIVAN) PO - - - - - - -  methotrexate (PF) IT [ 12 mg ] [ 12 mg  ] [ 12 mg ] [ 12 mg ] [ 12 mg ] [ 12 mg ] [ 12 mg ]  ondansetron (ZOFRAN) IV [ 8 mg ] [ 8 mg ] [ 8 mg ] [ 8 mg ] [ 8 mg ] [ 8 mg ] [ 8 mg ]  pegfilgrastim (NEULASTA ONPRO KIT) [place] - - - - - - -  pegfilgrastim (NEULASTA) [place] - - - - - - -  prochlorperazine (COMPAZINE) IV - - - - - - -    INTERVAL HISTORY: 55 year old lady who has a 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 Patient is here for further follow-up extremely depressed.  Does not want any medication.  Family reports that patient is behaving somewhat what abnormally. He does not complain of any income except for headache.  Patient's ambulation remains limited.  She thinks that family is getting into her 14 and stealing stuff.  She does not like that invasion of her privacy.  No seizure activity.      REVIEW OF SYSTEMS:   GENERAL:  Patient is feeling weak and tired.  Patient is somewhat depressed.  Poor appetite.  L  Aches and pains Very emotional does not want any further treatment PERFORMANCE STATUS (ECOG):  2 HEENT:  No visual changes, runny nose, sore throat, mouth sores or tenderness. Lungs: No shortness of  breath or cough.  No hemoptysis. Cardiac:  No chest pain, palpitations, orthopnea, or PND. GI: intermittent  Nausea and vomiting. He should not complaints of nausea and vomiting for last 2 days.  Epigastric discomfort.  Taking Nexium once a day GU:  No urgency, frequency, dysuria, or hematuria.June 09, 2015 Musculoskeletal:  No back pain.  No joint pain.  No muscle tenderness. Extremities:  No pain or swelling. Skin:  No rashes or skin changes. Neuro:   Weakness in both lower extremity.  Ataxia.  [place]ard and tone is decreased to 1 /5 in both lower extremity  Cranial nodes are intact Endocrine:  No diabetes, thyroid issues, hot flashes or night sweats. Psych:  No mood changes, depression or anxiety. Pain: Neck pain Review of systems:  All other systems reviewed and found to be  negative. Epigastric discomfort which is persistent dull aching As per HPI. Otherwise, a complete review of systems is negatve.  PAST MEDICAL HISTORY: Past Medical History  Diagnosis Date  . Arthritis   . Diabetes mellitus without complication ([place])   . Colon polyps   . GERD (gastroesophageal reflux disease)   . Cancer ([place]) 2002     LEFT MASTECTOMY  . Breast cancer, female ([place]) 1997  . Shortness of breath dyspnea   . Headache     PAST SURGICAL HISTORY: Past Surgical History  Procedure Laterality Date  . Mastectomy Left 2002  . Colonoscopy  [id number]    [email or web address]  . Foot surgery Right   . Gastric endoscopy   2002  . Bone marrow transplant    . Cholecystectomy  1995  . Sigmoidoscopy  2013    FAMILY HISTORY Family History  Problem Relation Age of Onset  . Colon cancer Sister   . COPD Mother       ADVANCED DIRECTIVES:  Patient does   Not have advanced health care directive Patient does not have any advanced healthcare directive. Information has been given.     HEALTH MAINTENANCE: Social History  Substance Use Topics  . Smoking status: Never Smoker   . Smokeless tobacco: Never Used  . Alcohol Use: No      Allergies  Allergen Reactions  . Codeine Hives and Nausea Only    Other Reaction: GI Upset  . Latex Itching and Hives  . Shellfish Allergy Swelling    Lips    Current Outpatient Prescriptions  Medication Sig Dispense Refill  . Ascorbic Acid (VITAMIN C) 1000 MG tablet Take 1,000 mg by mouth daily.    Marland Kitchen esomeprazole (NEXIUM) 40 MG capsule Take 40 mg by mouth 2 (two) times daily before a meal.     . fentaNYL (DURAGESIC - DOSED MCG/HR) 25 MCG/HR patch Place 1 patch (25 mcg total) onto the skin every 3 (three) days. 10 patch 0  . fentaNYL (DURAGESIC - DOSED MCG/HR) 25 MCG/HR patch     . furosemide (LASIX) 20 MG tablet TAKE 1 TABLET EVERY DAY 30 tablet 4  . KLOR-CON 10 10 MEQ tablet TAKE 1 TABLET (10 MEQ TOTAL) BY MOUTH 2 (TWO) TIMES DAILY. 60 tablet 3   . lactulose (CHRONULAC) 10 GM/15ML solution Take 15 mLs (10 g total) by mouth 2 (two) times daily as needed for mild constipation. 240 mL 0  . lidocaine-prilocaine (EMLA) cream   3  . LORazepam (ATIVAN) 0.5 MG tablet Take 1 tablet (0.5 mg total) by mouth 3 (three) times daily as needed for anxiety. 90 tablet 3  . ondansetron (ZOFRAN) 4 MG tablet TAKE 1 TABLET (  4 MG TOTAL) BY MOUTH EVERY 6 (SIX) HOURS AS NEEDED FOR NAUSEA OR VOMITING. 30 tablet 3  . ondansetron (ZOFRAN) 4 MG tablet     . oxyCODONE-acetaminophen (PERCOCET) 10-325 MG tablet Take 1 tablet by mouth every 4 (four) hours as needed for pain. 60 tablet 0  . PARoxetine (PAXIL) 40 MG tablet TAKE 1 TABLET ONCE DAILY 30 tablet 3  . polyethylene glycol powder (GLYCOLAX/MIRALAX) powder 255 grams one bottle for colonoscopy prep 255 g 0  . promethazine (PHENERGAN) 25 MG suppository Place 1 suppository (25 mg total) rectally every 6 (six) hours as needed for nausea. 12 suppository 1  . senna-docusate (SENNA S) 8.6-50 MG tablet Take 1 tablet by mouth 2 (two) times daily. 60 tablet 3  . glipiZIDE (GLUCOTROL) 5 MG tablet TAKE 1 TABLET BY MOUTH 30 MINUTES BEFORE A MEAL TWICE A DAY  2   No current facility-administered medications for this visit.   Facility-Administered Medications Ordered in Other Visits  Medication Dose Route Frequency Provider Last Rate Last Dose  . 0.9 %  sodium chloride infusion   Intravenous Continuous Forest Gleason, MD 10 mL/hr at 09/10/15 0940    . heparin lock flush 100 unit/mL  500 Units Intravenous Once Forest Gleason, MD   500 Units at 09/10/15 1254  . sodium chloride 0.9 % injection 10 mL  10 mL Intracatheter PRN Forest Gleason, MD   10 mL at 02/18/15 1453  . sodium chloride 0.9 % injection 10 mL  10 mL Intracatheter PRN Forest Gleason, MD   10 mL at 08/10/15 1400  . sodium chloride 0.9 % injection 10 mL  10 mL Intravenous PRN Forest Gleason, MD   10 mL at 09/16/15 0900    OBJECTIVE:  Filed Vitals:   10/14/15 1140 10/14/15  1144  BP: 144/83   Pulse: 144   Temp: 98 F (36.7 C)   Resp:  18     There is no weight on file to calculate BMI.    ECOG FS:2 - Symptomatic, <50% confined to bed  PHYSICAL EXAM: GENERAL:  Patient is in wheelchair. Very emotional.  Does not communicate that well.  MENTAL STATUS: cc slightly more alert oriented and conversational. HEAD:alopecia i.  Normocephalic, atraumatic, face symmetric, no Cushingoid features. EYES:  Pupils equal round and reactive to light and accomodation.  No conjunctivitis or scleral icterus. ENT:  Oropharynx clear without lesion.  Tongue normal. Mucous membranes moist.  RESPIRATORY:  Clear to auscultation without rales, wheezes or rhonchi. CARDIOVASCULAR:  Regular rate and rhythm without murmur, rub or gallop. Abdomen: Tenderness in epigastric area.  Liver is not palpable.  No ascites. Oh palpable mass BACK:  No CVA tenderness.  No tenderness on percussion of the back or rib cage. SKIN:  No rashes, ulcers or lesions. EXTREMITIES: No edema, no skin discoloration or tenderness.  No palpable cords. LYMPH NODES: No palpable cervical, supraclavicular, axillary or inguinal adenopathy  NEUROLOGICAL: Higher functions within normal limit.   Cranial nerves  are intact.   Weakness in both lower extremity with absolutely no movement.   PSYCH:  Very emotional and depressed LAB RESULTS:  Infusion on 10/14/2015  Component Date Value Ref Range Status  . WBC 10/14/2015 5.4  3.6 - 11.0 K/uL Final  . RBC 10/14/2015 3.64* 3.80 - 5.20 MIL/uL Final  . Hemoglobin 10/14/2015 11.6* 12.0 - 16.0 g/dL Final  . HCT 10/14/2015 34.9* 35.0 - 47.0 % Final  . MCV 10/14/2015 96.1  80.0 - 100.0 fL Final  . MCH  10/14/2015 31.9  26.0 - 34.0 pg Final  . MCHC 10/14/2015 33.2  32.0 - 36.0 g/dL Final  . RDW 10/14/2015 22.3* 11.5 - 14.5 % Final  . Platelets 10/14/2015 179  150 - 440 K/uL Final  . Neutrophils Relative % 10/14/2015 59   Final  . Neutro Abs 10/14/2015 3.2  1.4 - 6.5 K/uL Final  .  Lymphocytes Relative 10/14/2015 22   Final  . Lymphs Abs 10/14/2015 1.2  1.0 - 3.6 K/uL Final  . Monocytes Relative 10/14/2015 15   Final  . Monocytes Absolute 10/14/2015 0.8  0.2 - 0.9 K/uL Final  . Eosinophils Relative 10/14/2015 3   Final  . Eosinophils Absolute 10/14/2015 0.2  0 - 0.7 K/uL Final  . Basophils Relative 10/14/2015 1   Final  . Basophils Absolute 10/14/2015 0.1  0 - 0.1 K/uL Final  . Sodium 10/14/2015 133* 135 - 145 mmol/L Final  . Potassium 10/14/2015 3.3* 3.5 - 5.1 mmol/L Final  . Chloride 10/14/2015 98* 101 - 111 mmol/L Final  . CO2 10/14/2015 28  22 - 32 mmol/L Final  . Glucose, Bld 10/14/2015 111* 65 - 99 mg/dL Final  . BUN 10/14/2015 9  6 - 20 mg/dL Final  . Creatinine, Ser 10/14/2015 0.40* 0.44 - 1.00 mg/dL Final  . Calcium 10/14/2015 8.9  8.9 - 10.3 mg/dL Final  . Total Protein 10/14/2015 5.4* 6.5 - 8.1 g/dL Final  . Albumin 10/14/2015 3.2* 3.5 - 5.0 g/dL Final  . AST 10/14/2015 12* 15 - 41 U/L Final  . ALT 10/14/2015 11* 14 - 54 U/L Final  . Alkaline Phosphatase 10/14/2015 67  38 - 126 U/L Final  . Total Bilirubin 10/14/2015 1.5* 0.3 - 1.2 mg/dL Final  . GFR calc non Af Amer 10/14/2015 >60  >60 mL/min Final  . GFR calc Af Amer 10/14/2015 >60  >60 mL/min Final   Comment: (NOTE) The eGFR has been calculated using the CKD EPI equation. This calculation has not been validated in all clinical situations. eGFR's persistently <60 mL/min signify possible Chronic Kidney Disease.   . Anion gap 10/14/2015 7  5 - 15 Final  . CA 27.29 10/14/2015 32.4  0.0 - 38.6 U/mL Final   Comment: (NOTE) [employer]/ACS methodology Performed At: [facility] [place], [place] [id number] Lindon Romp MD OT:[id number]     Lab Results  Component Value Date   LABCA2 32.4 10/14/2015   .   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 Patient absolutely refuses all chemotherapy And a prolonged discussion with patient and  family At this point in time patient is on Paxil 40 mg some other medication might have to be added.  Will discuss that with psychiatrist.\The patient refuses all the treatment possibility of hospice and palliative care can be considered.  Possibility of adding anti-hormonal therapy including IBRANCE and Faslodex might have to be considered. Total duration of visit was 45 minutes.  50% or more time was spent in counseling patient and family regarding prognosis and options of treatment and available resources   Breast cancer metastasized to liver   Staging form: Breast, AJCC 7th Edition     Clinical: Stage IV (T2, N3, M1) - Signed by Forest Gleason, MD on 02/18/2015   Forest Gleason, MD   10/15/2015 9:14 AM

## 2015-10-19 ENCOUNTER — Inpatient Hospital Stay: Payer: Managed Care, Other (non HMO)

## 2015-10-19 ENCOUNTER — Inpatient Hospital Stay: Payer: Managed Care, Other (non HMO) | Admitting: Oncology

## 2015-10-22 ENCOUNTER — Inpatient Hospital Stay: Payer: Managed Care, Other (non HMO)

## 2015-10-22 ENCOUNTER — Inpatient Hospital Stay (HOSPITAL_BASED_OUTPATIENT_CLINIC_OR_DEPARTMENT_OTHER): Payer: Managed Care, Other (non HMO) | Admitting: Oncology

## 2015-10-22 VITALS — BP 103/73 | HR 166 | Temp 97.0°F | Resp 18

## 2015-10-22 DIAGNOSIS — R630 Anorexia: Secondary | ICD-10-CM

## 2015-10-22 DIAGNOSIS — F329 Major depressive disorder, single episode, unspecified: Secondary | ICD-10-CM

## 2015-10-22 DIAGNOSIS — Z7984 Long term (current) use of oral hypoglycemic drugs: Secondary | ICD-10-CM

## 2015-10-22 DIAGNOSIS — C50919 Malignant neoplasm of unspecified site of unspecified female breast: Secondary | ICD-10-CM

## 2015-10-22 DIAGNOSIS — C7951 Secondary malignant neoplasm of bone: Secondary | ICD-10-CM | POA: Diagnosis not present

## 2015-10-22 DIAGNOSIS — Z79810 Long term (current) use of selective estrogen receptor modulators (SERMs): Secondary | ICD-10-CM

## 2015-10-22 DIAGNOSIS — C7932 Secondary malignant neoplasm of cerebral meninges: Secondary | ICD-10-CM | POA: Diagnosis not present

## 2015-10-22 DIAGNOSIS — C7949 Secondary malignant neoplasm of other parts of nervous system: Secondary | ICD-10-CM

## 2015-10-22 DIAGNOSIS — R531 Weakness: Secondary | ICD-10-CM

## 2015-10-22 DIAGNOSIS — C787 Secondary malignant neoplasm of liver and intrahepatic bile duct: Secondary | ICD-10-CM

## 2015-10-22 DIAGNOSIS — C50912 Malignant neoplasm of unspecified site of left female breast: Secondary | ICD-10-CM | POA: Diagnosis not present

## 2015-10-22 DIAGNOSIS — Z9012 Acquired absence of left breast and nipple: Secondary | ICD-10-CM

## 2015-10-22 DIAGNOSIS — Z923 Personal history of irradiation: Secondary | ICD-10-CM

## 2015-10-22 DIAGNOSIS — E119 Type 2 diabetes mellitus without complications: Secondary | ICD-10-CM

## 2015-10-22 DIAGNOSIS — R0602 Shortness of breath: Secondary | ICD-10-CM

## 2015-10-22 DIAGNOSIS — Z86010 Personal history of colonic polyps: Secondary | ICD-10-CM

## 2015-10-22 DIAGNOSIS — Z79899 Other long term (current) drug therapy: Secondary | ICD-10-CM

## 2015-10-22 DIAGNOSIS — K219 Gastro-esophageal reflux disease without esophagitis: Secondary | ICD-10-CM

## 2015-10-22 DIAGNOSIS — C7800 Secondary malignant neoplasm of unspecified lung: Secondary | ICD-10-CM

## 2015-10-22 DIAGNOSIS — R5383 Other fatigue: Secondary | ICD-10-CM

## 2015-10-22 DIAGNOSIS — M129 Arthropathy, unspecified: Secondary | ICD-10-CM

## 2015-10-22 DIAGNOSIS — Z800 Family history of malignant neoplasm of digestive organs: Secondary | ICD-10-CM

## 2015-10-22 DIAGNOSIS — R51 Headache: Secondary | ICD-10-CM

## 2015-10-22 LAB — CBC WITH DIFFERENTIAL/PLATELET
BASOS PCT: 2 %
Basophils Absolute: 0.3 10*3/uL — ABNORMAL HIGH (ref 0–0.1)
Eosinophils Absolute: 0.2 10*3/uL (ref 0–0.7)
Eosinophils Relative: 2 %
HEMATOCRIT: 36.4 % (ref 35.0–47.0)
HEMOGLOBIN: 11.8 g/dL — AB (ref 12.0–16.0)
LYMPHS ABS: 1.4 10*3/uL (ref 1.0–3.6)
LYMPHS PCT: 11 %
MCH: 31.1 pg (ref 26.0–34.0)
MCHC: 32.3 g/dL (ref 32.0–36.0)
MCV: 96.2 fL (ref 80.0–100.0)
MONO ABS: 0.7 10*3/uL (ref 0.2–0.9)
MONOS PCT: 5 %
NEUTROS ABS: 10.8 10*3/uL — AB (ref 1.4–6.5)
NEUTROS PCT: 80 %
PLATELETS: 136 10*3/uL — AB (ref 150–440)
RBC: 3.79 MIL/uL — ABNORMAL LOW (ref 3.80–5.20)
RDW: 20.6 % — AB (ref 11.5–14.5)
WBC: 13.5 10*3/uL — ABNORMAL HIGH (ref 3.6–11.0)

## 2015-10-22 LAB — CSF CELL COUNT WITH DIFFERENTIAL
Eosinophils, CSF: 0 %
LYMPHS CSF: 0 %
MONOCYTE-MACROPHAGE-SPINAL FLUID: 0 %
Other Cells, CSF: 0
RBC COUNT CSF: 5 /mm3 — AB (ref 0–3)
Segmented Neutrophils-CSF: 0 %
WBC CSF: 0 /mm3

## 2015-10-22 MED ORDER — SODIUM CHLORIDE 0.9 % IV SOLN
Freq: Once | INTRAVENOUS | Status: AC
  Administered 2015-10-22: 13:00:00 via INTRAVENOUS
  Filled 2015-10-22: qty 4

## 2015-10-22 MED ORDER — HEPARIN SOD (PORK) LOCK FLUSH 100 UNIT/ML IV SOLN
500.0000 [IU] | Freq: Once | INTRAVENOUS | Status: AC
  Administered 2015-10-22: 500 [IU] via INTRAVENOUS
  Filled 2015-10-22: qty 5

## 2015-10-22 MED ORDER — TAMOXIFEN CITRATE 20 MG PO TABS: 20.0000 mg | ORAL_TABLET | Freq: Every day | ORAL | Status: AC

## 2015-10-22 MED ORDER — SODIUM CHLORIDE 0.9 % IJ SOLN
Freq: Once | INTRAMUSCULAR | Status: AC
  Administered 2015-10-22: 15:00:00 via INTRATHECAL
  Filled 2015-10-22: qty 0.48

## 2015-10-22 MED ORDER — FENTANYL 25 MCG/HR TD PT72: 25.0000 ug | MEDICATED_PATCH | TRANSDERMAL | Status: DC

## 2015-10-22 MED ORDER — SODIUM CHLORIDE 0.9 % IJ SOLN
10.0000 mL | INTRAMUSCULAR | Status: AC | PRN
  Administered 2015-10-22: 10 mL via INTRAVENOUS
  Filled 2015-10-22: qty 10

## 2015-10-22 NOTE — Progress Notes (Signed)
Patient accompanied by family today.  Husband state patient threw up 2 x yesterday.  Also requesting refill for Fentanyl patches.

## 2015-10-23 ENCOUNTER — Encounter: Payer: Self-pay | Admitting: Oncology

## 2015-10-23 DIAGNOSIS — C787 Secondary malignant neoplasm of liver and intrahepatic bile duct: Secondary | ICD-10-CM | POA: Diagnosis not present

## 2015-10-23 DIAGNOSIS — C50912 Malignant neoplasm of unspecified site of left female breast: Secondary | ICD-10-CM

## 2015-10-23 DIAGNOSIS — C7951 Secondary malignant neoplasm of bone: Secondary | ICD-10-CM

## 2015-10-23 DIAGNOSIS — C7932 Secondary malignant neoplasm of cerebral meninges: Secondary | ICD-10-CM | POA: Diagnosis not present

## 2015-10-23 DIAGNOSIS — C7800 Secondary malignant neoplasm of unspecified lung: Secondary | ICD-10-CM

## 2015-10-23 NOTE — Progress Notes (Signed)
[place] @ [facility] [place] [phone]  Fax:([phone]) [place] OB: [birth year]  MR#: [id number]  WNU#:[id number]  Patient Care Team: Pcp Not In System as PCP - General Robert Bellow, MD (General Surgery)  CHIEF COMPLAINT:  Chief Complaint  Patient presents with  . Breast Cancer    Chief Complaint/Problem List  1.  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 Status post Cytoxan, Adriamycin, followed by high dose chemotherapy and stem cell support.  Status post radiation therapy. 1996 2.  Was on Tamoxifen therapy.  3.  Progressive disease with the left supraclavicular lymph node positive on needle aspiration.  4.  Tamoxifen has been discontinued.  5.  Ovarian ablation with Lupron, Femara and radiation to the ovaries starting in December 2003. 6.  On Femara  7.  Rising tumor markers.  CT scan is positive for liver metastases.  And lung metastasesBiopsies positive for metastatic disease (liver biopsy) consistent with breast primary. November, 2014 [place] and progesterone receptor positive.  HER-2 receptor negative.. 8.  Abraxene starting  from September 03, 2013.  9.Patient was taken off Abraxane because of progressive side effect and started on ERIBULIN April 08, 2014    10.  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, 2016) Patient started on intrathecal methotrexate October 2 016    Oncology Flowsheet 08/17/2015 08/24/2015 09/10/2015 09/16/2015 09/23/2015 09/30/2015 10/22/2015  Day, Cycle Day 1, Cycle 2 cycle 2 , Cycle 2 Day 1, Cycle 3 cycle 2 , Cycle 3 Day 1, Cycle 4 cycle 2 , Cycle 4 cycle 2 , Cycle 5  dexamethasone (DECADRON) IV [ 10 mg ] [ 10 mg ] [ 10 mg ] [ 10 mg ] [ 10 mg ] [ 10 mg ] [ 10 mg ]  eriBULin mesylate (HALAVEN) IV - - - - - - -  LORazepam (ATIVAN) IV - - - - - - -  LORazepam (ATIVAN) PO - - - - - - -  methotrexate (PF) IT [ 12 mg ] [ 12 mg ]  [ 12 mg ] [ 12 mg ] [ 12 mg ] [ 12 mg ] [ 12 mg ]  ondansetron (ZOFRAN) IV [ 8 mg ] [ 8 mg ] [ 8 mg ] [ 8 mg ] [ 8 mg ] [ 8 mg ] [ 8 mg ]  pegfilgrastim (NEULASTA ONPRO KIT) [place] - - - - - - -  pegfilgrastim (NEULASTA) [place] - - - - - - -  prochlorperazine (COMPAZINE) IV - - - - - - -    INTERVAL HISTORY: 55 year old lady who has a 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 Is and is here for further follow-up since last evaluation patient's condition continues to decline.  Has increasing headache.  3 were a few times in the last few days.  Patient has not regained his strength in lower extremity.  Patient remains bedridden.  With performance status declining.      REVIEW OF SYSTEMS:   GENERAL:  Patient is feeling weak and tired.  Patient is somewhat depressed.  Poor appetite.  L  Aches and pains Very emotional does not want any further treatment PERFORMANCE STATUS (ECOG):  2 HEENT:  No visual changes, runny nose, sore throat, mouth sores or tenderness. Lungs: No shortness of breath or cough.  No hemoptysis. Cardiac:  No chest pain, palpitations, orthopnea, or PND. GI: intermittent  Nausea and vomiting. He should not complaints of nausea and vomiting for last 2 days.  Epigastric discomfort.  Taking Nexium once a day GU:  No urgency, frequency, dysuria, or hematuria.June 09, 2015 Musculoskeletal:  No back pain.  No joint pain.  No muscle tenderness. Extremities:  No pain or swelling. Skin:  No rashes or skin changes. Neuro:   Weakness in both lower extremity.  Ataxia.  Howard and tone is decreased to 1 /5 in both lower extremity  Cranial nodes are intact Endocrine:  No diabetes, thyroid issues, hot flashes or night sweats. Psych:  No mood changes, depression or anxiety. Pain: Neck pain Review of systems:  All other systems reviewed and found to be negative. Epigastric discomfort which is persistent dull aching As per HPI. Otherwise, a complete review of  systems is negatve.  PAST MEDICAL HISTORY: Past Medical History  Diagnosis Date  . Arthritis   . Diabetes mellitus without complication ([place])   . Colon polyps   . GERD (gastroesophageal reflux disease)   . Cancer ([place]) 2002     LEFT MASTECTOMY  . Breast cancer, female ([place]) 1997  . Shortness of breath dyspnea   . Headache     PAST SURGICAL HISTORY: Past Surgical History  Procedure Laterality Date  . Mastectomy Left 2002  . Colonoscopy  [id number]    DR.BYRNETT  . Foot surgery Right   . Gastric endoscopy   2002  . Bone marrow transplant    . Cholecystectomy  1995  . Sigmoidoscopy  2013    FAMILY HISTORY Family History  Problem Relation Age of Onset  . Colon cancer Sister   . COPD Mother       ADVANCED DIRECTIVES:  Patient does   Not have advanced health care directive Patient does not have any advanced healthcare directive. Information has been given.     HEALTH MAINTENANCE: Social History  Substance Use Topics  . Smoking status: Never Smoker   . Smokeless tobacco: Never Used  . Alcohol Use: No      Allergies  Allergen Reactions  . Codeine Hives and Nausea Only    Other Reaction: GI Upset  . Latex Itching and Hives  . Shellfish Allergy Swelling    Lips    Current Outpatient Prescriptions  Medication Sig Dispense Refill  . Ascorbic Acid (VITAMIN C) 1000 MG tablet Take 1,000 mg by mouth daily.    Marland Kitchen esomeprazole (NEXIUM) 40 MG capsule Take 40 mg by mouth 2 (two) times daily before a meal.     . fentaNYL (DURAGESIC - DOSED MCG/HR) 25 MCG/HR patch Place 1 patch (25 mcg total) onto the skin every 3 (three) days. 10 patch 0  . furosemide (LASIX) 20 MG tablet TAKE 1 TABLET EVERY DAY 30 tablet 4  . glipiZIDE (GLUCOTROL) 5 MG tablet TAKE 1 TABLET BY MOUTH 30 MINUTES BEFORE A MEAL TWICE A DAY  2  . KLOR-CON 10 10 MEQ tablet TAKE 1 TABLET (10 MEQ TOTAL) BY MOUTH 2 (TWO) TIMES DAILY. 60 tablet 3  . lactulose (CHRONULAC) 10 GM/15ML solution Take 15 mLs (10 g  total) by mouth 2 (two) times daily as needed for mild constipation. 240 mL 0  . lidocaine-prilocaine (EMLA) cream   3  . LORazepam (ATIVAN) 0.5 MG tablet Take 1 tablet (0.5 mg total) by mouth 3 (three) times daily as needed for anxiety. 90 tablet 3  . ondansetron (ZOFRAN) 4 MG tablet TAKE 1 TABLET (4 MG TOTAL) BY MOUTH EVERY 6 (SIX) HOURS  AS NEEDED FOR NAUSEA OR VOMITING. 30 tablet 3  . ondansetron (ZOFRAN) 4 MG tablet     . oxyCODONE-acetaminophen (PERCOCET) 10-325 MG tablet Take 1 tablet by mouth every 4 (four) hours as needed for pain. 60 tablet 0  . PARoxetine (PAXIL) 40 MG tablet TAKE 1 TABLET ONCE DAILY 30 tablet 3  . polyethylene glycol powder (GLYCOLAX/MIRALAX) powder 255 grams one bottle for colonoscopy prep 255 g 0  . promethazine (PHENERGAN) 25 MG suppository Place 1 suppository (25 mg total) rectally every 6 (six) hours as needed for nausea. 12 suppository 1  . senna-docusate (SENNA S) 8.6-50 MG tablet Take 1 tablet by mouth 2 (two) times daily. 60 tablet 3  . tamoxifen (NOLVADEX) 20 MG tablet Take 1 tablet (20 mg total) by mouth daily. 30 tablet 2   No current facility-administered medications for this visit.   Facility-Administered Medications Ordered in Other Visits  Medication Dose Route Frequency Provider Last Rate Last Dose  . 0.9 %  sodium chloride infusion   Intravenous Continuous Forest Gleason, MD 10 mL/hr at 09/10/15 0940    . heparin lock flush 100 unit/mL  500 Units Intravenous Once Forest Gleason, MD   500 Units at 09/10/15 1254  . sodium chloride 0.9 % injection 10 mL  10 mL Intracatheter PRN Forest Gleason, MD   10 mL at 02/18/15 1453  . sodium chloride 0.9 % injection 10 mL  10 mL Intracatheter PRN Forest Gleason, MD   10 mL at 08/10/15 1400  . sodium chloride 0.9 % injection 10 mL  10 mL Intravenous PRN Forest Gleason, MD   10 mL at 09/16/15 0900  . sodium chloride 0.9 % injection 10 mL  10 mL Intravenous PRN Forest Gleason, MD   10 mL at 10/22/15 1258     OBJECTIVE:  Filed Vitals:   10/22/15 1424  BP: 103/73  Pulse: 166  Temp: 97 F (36.1 C)  Resp: 18     There is no weight on file to calculate BMI.    ECOG FS:2 - Symptomatic, <50% confined to bed  PHYSICAL EXAM: GENERAL:  Patient is in wheelchair. Very emotional.  Does not communicate that well.  MENTAL STATUS: cc slightly more alert oriented and conversational. HEAD:alopecia i.  Normocephalic, atraumatic, face symmetric, no Cushingoid features. EYES:  Pupils equal round and reactive to light and accomodation.  No conjunctivitis or scleral icterus. ENT:  Oropharynx clear without lesion.  Tongue normal. Mucous membranes moist.  RESPIRATORY:  Clear to auscultation without rales, wheezes or rhonchi. CARDIOVASCULAR:  Regular rate and rhythm without murmur, rub or gallop. Abdomen: Tenderness in epigastric area.  Liver is not palpable.  No ascites. Oh palpable mass BACK:  No CVA tenderness.  No tenderness on percussion of the back or rib cage. SKIN:  No rashes, ulcers or lesions. EXTREMITIES: No edema, no skin discoloration or tenderness.  No palpable cords. LYMPH NODES: No palpable cervical, supraclavicular, axillary or inguinal adenopathy  NEUROLOGICAL: Higher functions within normal limit.   Cranial nerves  are intact.   Weakness in both lower extremity with absolutely no movement.   PSYCH:  Very emotional and depressed LAB RESULTS:  Infusion on 10/22/2015  Component Date Value Ref Range Status  . WBC 10/22/2015 13.5* 3.6 - 11.0 K/uL Final  . RBC 10/22/2015 3.79* 3.80 - 5.20 MIL/uL Final  . Hemoglobin 10/22/2015 11.8* 12.0 - 16.0 g/dL Final  . HCT 10/22/2015 36.4  35.0 - 47.0 % Final  . MCV 10/22/2015 96.2  80.0 -  100.0 fL Final  . MCH 10/22/2015 31.1  26.0 - 34.0 pg Final  . MCHC 10/22/2015 32.3  32.0 - 36.0 g/dL Final  . RDW 10/22/2015 20.6* 11.5 - 14.5 % Final  . Platelets 10/22/2015 136* 150 - 440 K/uL Final  . Neutrophils Relative % 10/22/2015 80   Final  . Neutro  Abs 10/22/2015 10.8* 1.4 - 6.5 K/uL Final  . Lymphocytes Relative 10/22/2015 11   Final  . Lymphs Abs 10/22/2015 1.4  1.0 - 3.6 K/uL Final  . Monocytes Relative 10/22/2015 5   Final  . Monocytes Absolute 10/22/2015 0.7  0.2 - 0.9 K/uL Final  . Eosinophils Relative 10/22/2015 2   Final  . Eosinophils Absolute 10/22/2015 0.2  0 - 0.7 K/uL Final  . Basophils Relative 10/22/2015 2   Final  . Basophils Absolute 10/22/2015 0.3* 0 - 0.1 K/uL Final  Office Visit on 10/22/2015  Component Date Value Ref Range Status  . Tube # 10/22/2015 sterile cup   Final  . Color, CSF 10/22/2015 CLEAR* COLORLESS Final  . Appearance, CSF 10/22/2015 COLORLESS* CLEAR Final  . RBC Count, CSF 10/22/2015 5* 0 - 3 /cu mm Final  . WBC, CSF 10/22/2015 0   Final  . Segmented Neutrophils-CSF 10/22/2015 0   Final  . Lymphs, CSF 10/22/2015 0   Final  . Monocyte-Macrophage-Spinal Fluid 10/22/2015 0   Final  . Eosinophils, CSF 10/22/2015 0   Final  . Other Cells, CSF 10/22/2015 0   Final    Lab Results  Component Value Date   LABCA2 32.4 10/14/2015   .   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 She was willing to go through intrathecal chemotherapy Neurological system examination did not reveal any significant improvement in strength in lower extremity Intrathecal chemotherapy was given After initial preparation off  omaya reservoir .  Approximately 15 cc of spinal fluid was removed. 12.5 mg of methotrexate was injected Patient had Decadron and Zofran given prior to intrathecal chemotherapy  Initial lab evaluation CSF revealed no neutrophils.  Or abnormal cells. In spite of improvement clinically and leptomeninges disease there is no significant neurological improvement at present time I discussed with family again pros and cons of continuing therapy.  Losing control over systemic disease that is liver metastases as we cannot do chemotherapy because of poor performance status  Tamoxifen  was started with the hope of controlling systemic disease Family having hard time dealing with patient's bedridden situation At this point in time they are agreeable to proceed with hospice evaluation.  Patient has been referred to hospice Total duration of visit was 45 minutes.  50% or more time was spent in counseling patient and family regarding prognosis and options of treatment and available resources   Breast cancer metastasized to liver   Staging form: Breast, AJCC 7th Edition     Clinical: Stage IV (T2, N3, M1) - Signed by Forest Gleason, MD on 02/18/2015   Forest Gleason, MD   10/23/2015 8:15 AM

## 2015-10-27 ENCOUNTER — Telehealth: Payer: Self-pay | Admitting: *Deleted

## 2015-10-27 NOTE — Telephone Encounter (Signed)
Per VO Dr Oliva Bustard appt cancelled, Helene Kelp notified and will inform family of this

## 2015-10-27 NOTE — Telephone Encounter (Signed)
Asking what treatment the patient is to have on the 25th

## 2015-11-04 ENCOUNTER — Other Ambulatory Visit: Payer: Managed Care, Other (non HMO)

## 2015-11-04 ENCOUNTER — Ambulatory Visit: Payer: Managed Care, Other (non HMO)

## 2015-11-04 ENCOUNTER — Ambulatory Visit: Payer: Managed Care, Other (non HMO) | Admitting: Oncology

## 2015-11-13 ENCOUNTER — Telehealth: Payer: Self-pay | Admitting: *Deleted

## 2015-11-13 DIAGNOSIS — C50919 Malignant neoplasm of unspecified site of unspecified female breast: Secondary | ICD-10-CM

## 2015-11-13 DIAGNOSIS — C787 Secondary malignant neoplasm of liver and intrahepatic bile duct: Principal | ICD-10-CM

## 2015-11-13 MED ORDER — OXYCODONE-ACETAMINOPHEN 10-325 MG PO TABS: 1.0000 | ORAL_TABLET | ORAL | Status: DC | PRN

## 2015-11-13 MED ORDER — DOXYCYCLINE HYCLATE 100 MG PO TABS: 100.0000 mg | ORAL_TABLET | Freq: Two times a day (BID) | ORAL | Status: DC

## 2015-11-13 NOTE — Telephone Encounter (Signed)
rx has been faxed to [facility] on [address] Carlis Abbott aware.

## 2015-11-13 NOTE — Telephone Encounter (Signed)
Pt has decubitus ulcer on buttocks with drainage and foul smell. Would like antibiotic to be called into pharmacy. Per Magda Paganini, will call in doxycycline '100mg'$  BID x 14 days. Clarene Critchley with Hospice is aware.

## 2015-11-15 ENCOUNTER — Other Ambulatory Visit: Payer: Self-pay | Admitting: Oncology

## 2015-11-30 ENCOUNTER — Other Ambulatory Visit: Payer: Self-pay | Admitting: *Deleted

## 2015-11-30 DIAGNOSIS — C787 Secondary malignant neoplasm of liver and intrahepatic bile duct: Principal | ICD-10-CM

## 2015-11-30 DIAGNOSIS — C50919 Malignant neoplasm of unspecified site of unspecified female breast: Secondary | ICD-10-CM

## 2015-11-30 MED ORDER — FENTANYL 25 MCG/HR TD PT72: 25.0000 ug | MEDICATED_PATCH | TRANSDERMAL | Status: DC

## 2015-12-14 ENCOUNTER — Other Ambulatory Visit: Payer: Self-pay | Admitting: *Deleted

## 2015-12-14 DIAGNOSIS — C50919 Malignant neoplasm of unspecified site of unspecified female breast: Secondary | ICD-10-CM

## 2015-12-14 DIAGNOSIS — C787 Secondary malignant neoplasm of liver and intrahepatic bile duct: Principal | ICD-10-CM

## 2015-12-14 MED ORDER — OXYCODONE-ACETAMINOPHEN 10-325 MG PO TABS: 1.0000 | ORAL_TABLET | ORAL | Status: DC | PRN

## 2015-12-14 MED ORDER — FENTANYL 25 MCG/HR TD PT72: 25.0000 ug | MEDICATED_PATCH | TRANSDERMAL | Status: DC

## 2016-01-01 ENCOUNTER — Telehealth: Payer: Self-pay | Admitting: *Deleted

## 2016-01-01 NOTE — Telephone Encounter (Signed)
Wants to know if they can increase Fentanyl patch from 25 mcg to 50 mcg.  Also wants MD to know patient begin to lose her vision over the weekend and as of today she can only see light and shadows.  Used [facility] [address].

## 2016-01-05 ENCOUNTER — Telehealth: Payer: Self-pay | Admitting: *Deleted

## 2016-01-05 MED ORDER — FENTANYL 50 MCG/HR TD PT72: 50.0000 ug | MEDICATED_PATCH | TRANSDERMAL | Status: AC

## 2016-01-05 NOTE — Telephone Encounter (Signed)
Had asked to increase fentanyl to 50 mcg Friday and never heard back from [place]. After discussing with Magda Paganini, ok to increase dose, Helene Kelp will let me know if she needs a new Rx

## 2016-01-06 ENCOUNTER — Other Ambulatory Visit: Payer: Self-pay | Admitting: *Deleted

## 2016-01-06 DIAGNOSIS — C50919 Malignant neoplasm of unspecified site of unspecified female breast: Secondary | ICD-10-CM

## 2016-01-06 DIAGNOSIS — C787 Secondary malignant neoplasm of liver and intrahepatic bile duct: Principal | ICD-10-CM

## 2016-01-06 MED ORDER — DOXYCYCLINE HYCLATE 100 MG PO TABS: 100.0000 mg | ORAL_TABLET | Freq: Two times a day (BID) | ORAL | Status: AC

## 2016-01-06 MED ORDER — OXYCODONE-ACETAMINOPHEN 10-325 MG PO TABS: 1.0000 | ORAL_TABLET | ORAL | Status: AC | PRN

## 2016-01-06 NOTE — Telephone Encounter (Signed)
Doxycycline 100 mg bid tiems 14 days e scribed per VO L Herring, AGNP-C Teresa notified

## 2016-01-06 NOTE — Telephone Encounter (Signed)
Called to report that she has a large draining wound on her left hip draining foul smelling yellow substance which after showing pictures to hospice med director was told it looks like it may be MRSA. Asking if we want to put her on abx.

## 2016-01-13 ENCOUNTER — Other Ambulatory Visit: Payer: Self-pay | Admitting: Oncology

## 2016-01-25 ENCOUNTER — Telehealth: Payer: Self-pay | Admitting: *Deleted

## 2016-01-25 NOTE — Telephone Encounter (Signed)
Received fax that patient passed on 02-03-23

## 2016-02-08 DEATH — deceased

## 2016-03-28 ENCOUNTER — Other Ambulatory Visit: Payer: Self-pay | Admitting: Nurse Practitioner

## 2016-11-07 IMAGING — CT CT CHEST-ABD W/ CM
2 of 5 series · 12 of 36 positions shown, 18 images · IV contrast (isovue)
Comparison: CT scan 08/26/2013 and 02/17/2014

CLINICAL DATA: Restaging breast cancer.

EXAM:
CT CHEST AND ABDOMEN WITH CONTRAST
TECHNIQUE: Multidetector CT imaging of the chest and abdomen was performed
during bolus administration of intravenous contrast.
CONTRAST:  100 cc Isovue 370

[Series 2: cap with · axial · 0.85mm/px · z∈[-860,-534]mm · 9 of 81 slices shown, 15 images]
[im 8/81  mediastinal]
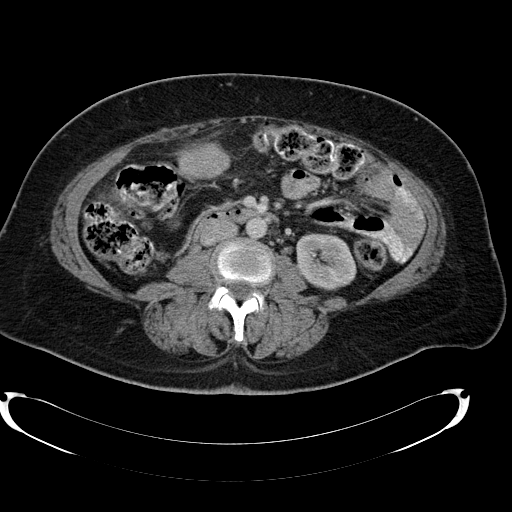
[im 8/81  bone]
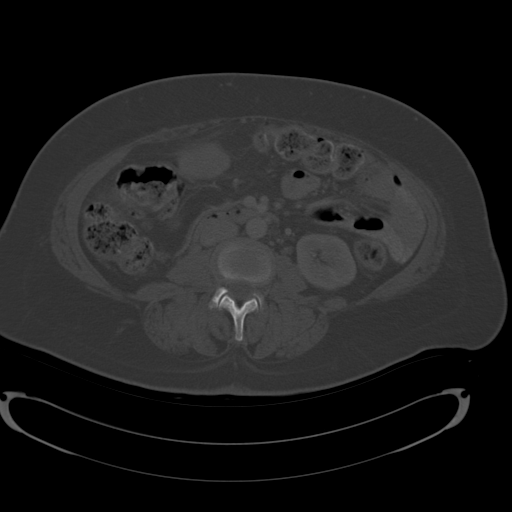
[im 15/81  mediastinal]
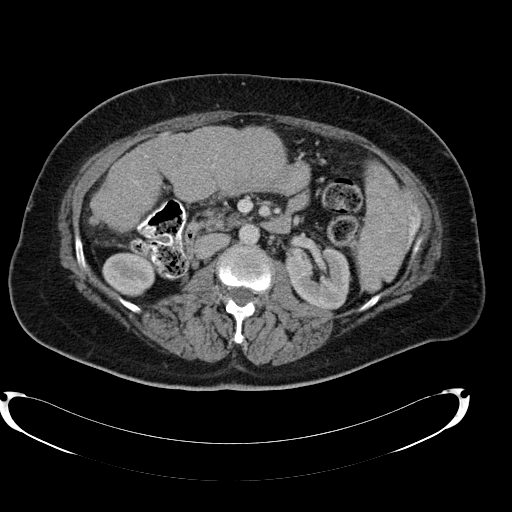
[im 22/81  mediastinal]
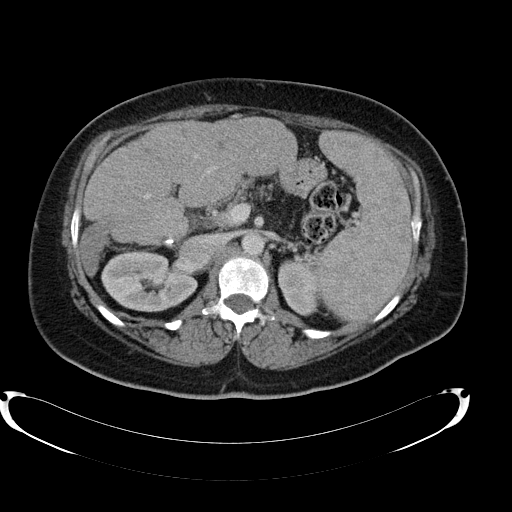
[im 30/81  mediastinal]
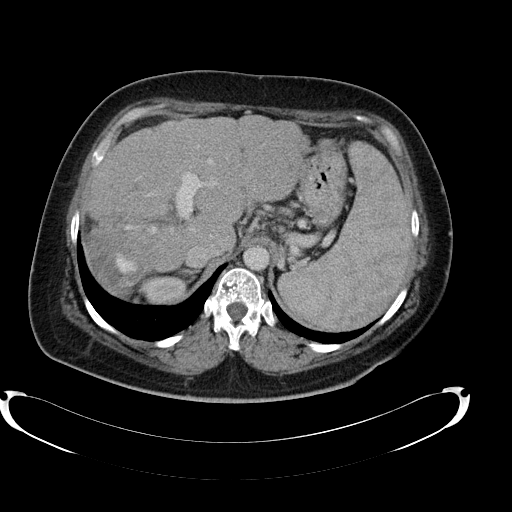
[im 44/81  mediastinal]
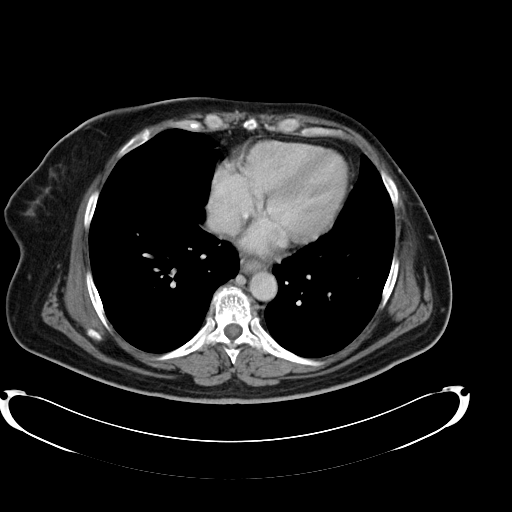
[im 51/81  mediastinal]
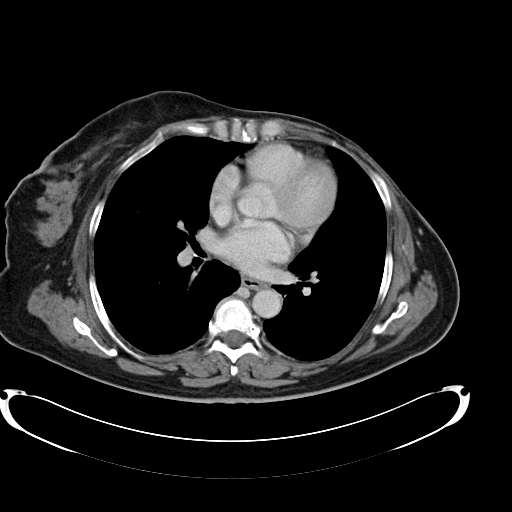
[im 51/81  lung]
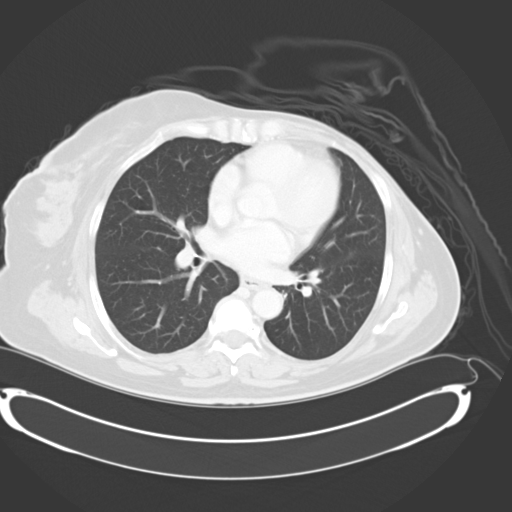
[im 59/81  mediastinal]
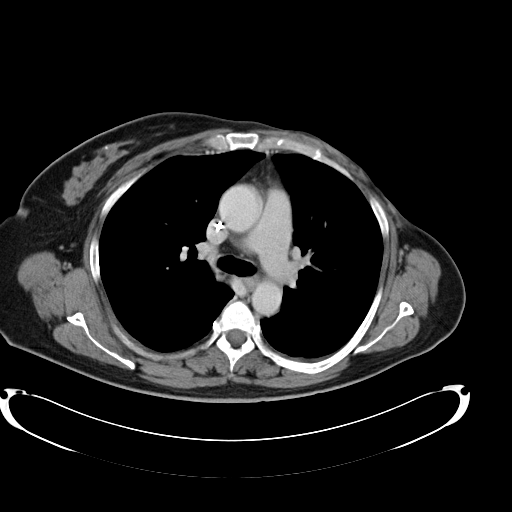
[im 59/81  lung]
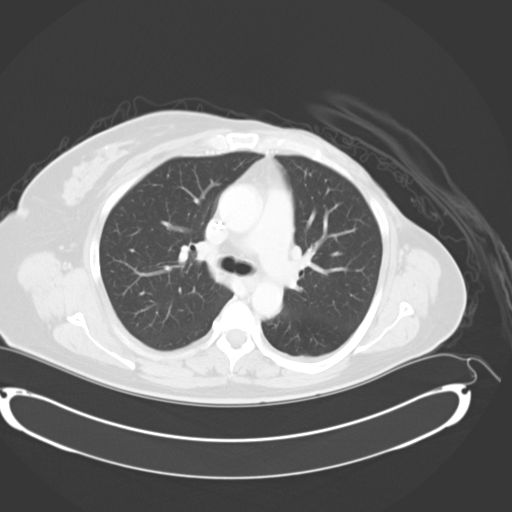
[im 66/81  mediastinal]
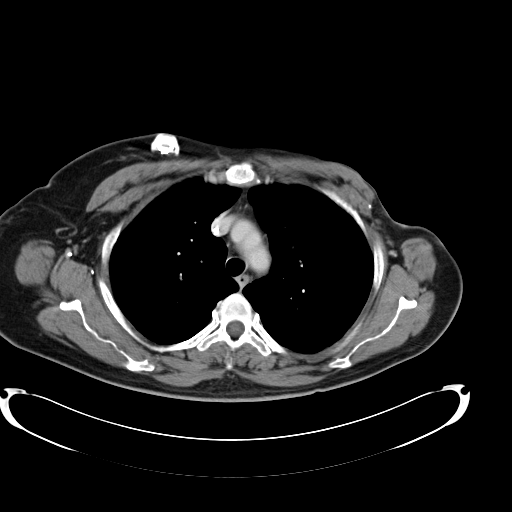
[im 66/81  lung]
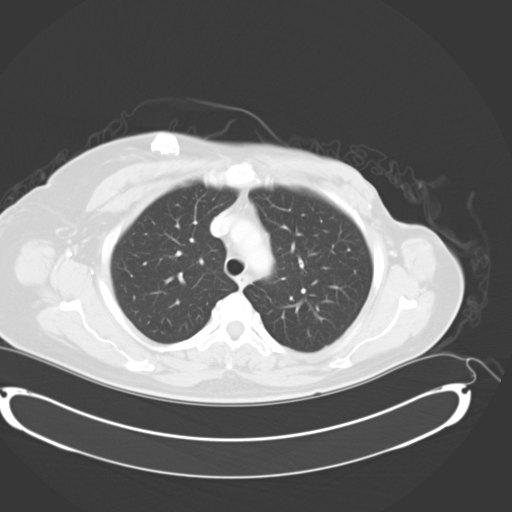
[im 73/81  mediastinal]
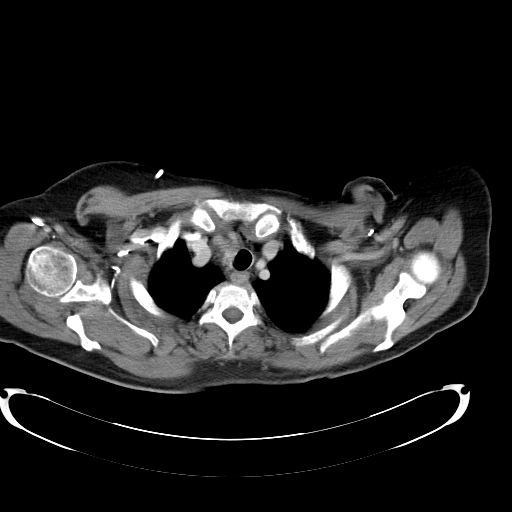
[im 73/81  lung]
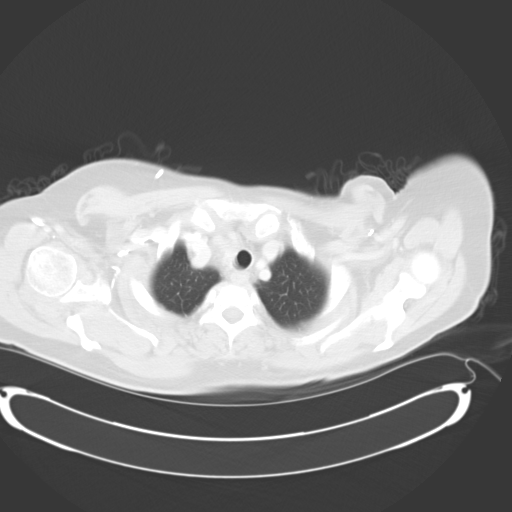
[im 73/81  bone]
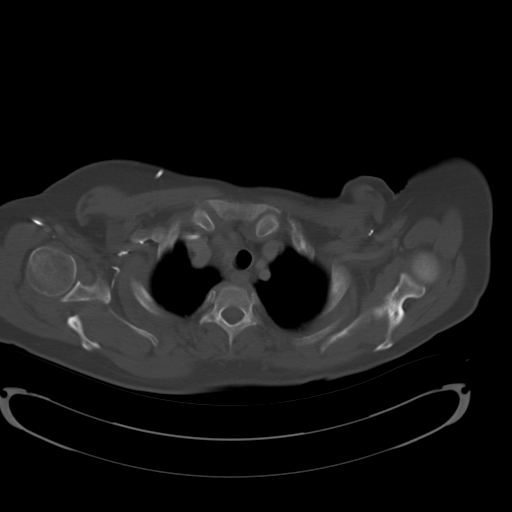

[Series 5: cor cap with cor · coronal · 0.72mm/px · 3 of 148 slices shown]
[im 30/148  mediastinal]
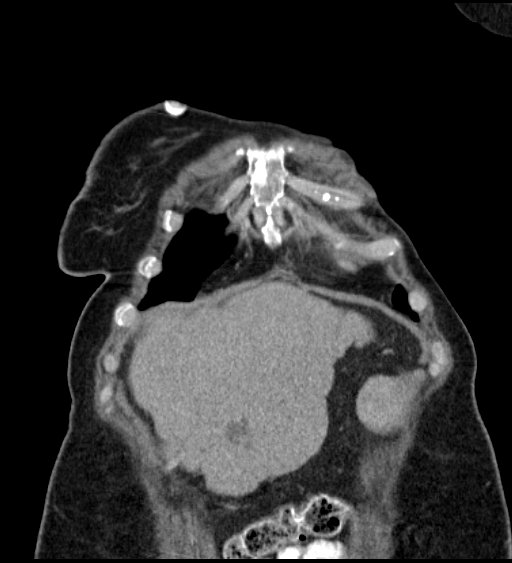
[im 59/148  mediastinal]
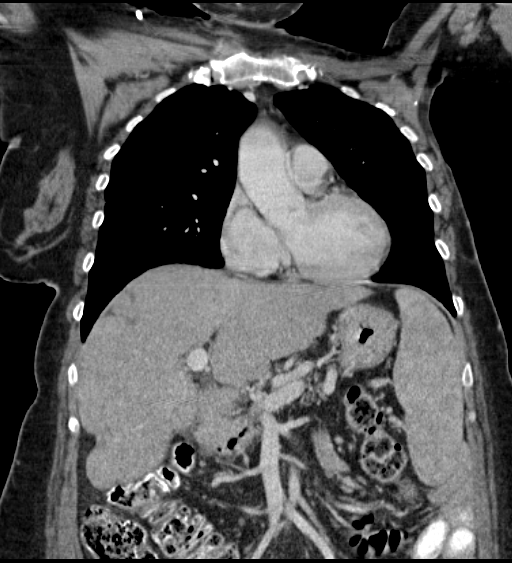
[im 89/148  mediastinal]
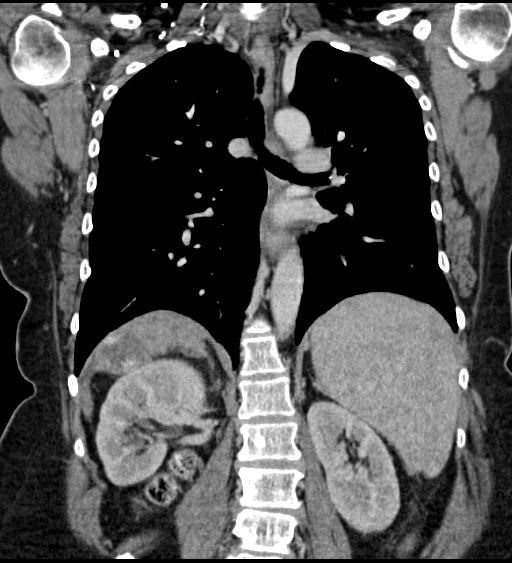

[12 of 36 positions shown; findings below may reference images not displayed]

FINDINGS: CT CHEST FINDINGS

Chest wall: Stable surgical changes from a left mastectomy. No
findings suspicious for recurrent chest wall tumor or axillary
adenopathy. The right breast is grossly normal. A right-sided
Port-A-Cath is noted. No right axillary adenopathy. The thyroid
gland appears normal. The bony thorax demonstrates improved osseous
metastatic disease. The thoracic vertebral body and sternal lesions
are definitely improved. No pathologic fracture.

Mediastinum: The heart is normal in size. No pericardial effusion.
No mediastinal or hilar mass or adenopathy. The aorta is normal in
caliber. No dissection. The esophagus is grossly normal. Esophageal
varices are noted.

Lungs: No acute pulmonary findings. No pleural effusion or pleural
thickening. The left lower lobe lung lesion demonstrated on the
prior chest CT surrounding the lower lobe bronchus has resolved. No
residual tumor. No findings suspicious for pulmonary metastasis.

CT ABDOMEN FINDINGS

Stable cirrhotic changes involving the liver with portal venous
hypertension, portal venous collaterals and splenomegaly. Extensive
esophageal varices. Numerous small hepatic lesions are noted and
consistent with known prior metastatic disease. There is also a
large heterogeneous enhancing mass in the right hepatic lobe. This
measures approximately 4.6 x 4.1 cm and has enlarged since the prior
CT scan worried measured 3.9 x 3.2 cm. The portal and splenic veins
are patent. The aorta and branch vessels are patent.

The pancreas demonstrates fairly significant atrophy but no focal
lesions. The adrenal glands and kidneys are unremarkable. No
mesenteric or retroperitoneal mass or adenopathy. No ascites.
IMPRESSION: 1. No findings for metastatic disease involving the chest. The left
lower lobe lung lesion has resolved. No residual pleural effusion
and no pulmonary nodules.
2. Improved osseous metastatic disease.
3. Severe cirrhosis and portal venous hypertension with portal
venous collaterals, esophageal varices and splenomegaly.
4. Multiple small low-attenuation hepatic lesions in a larger
heterogeneously enhancing mass in the right hepatic lobe appears
larger when compared to prior studies.

## 2017-10-02 IMAGING — RF DG FLUORO GUIDE LUMBAR PUNCTURE
1 series · 1 of 1 positions shown · non-contrast
Comparison: none

CLINICAL DATA: Breast malignancy with leptomeningeal involvement,
visual disturbances

[Series 1: cp_standard · 0.17mm/px · 1 of 1 slices shown]
[im 1/1]
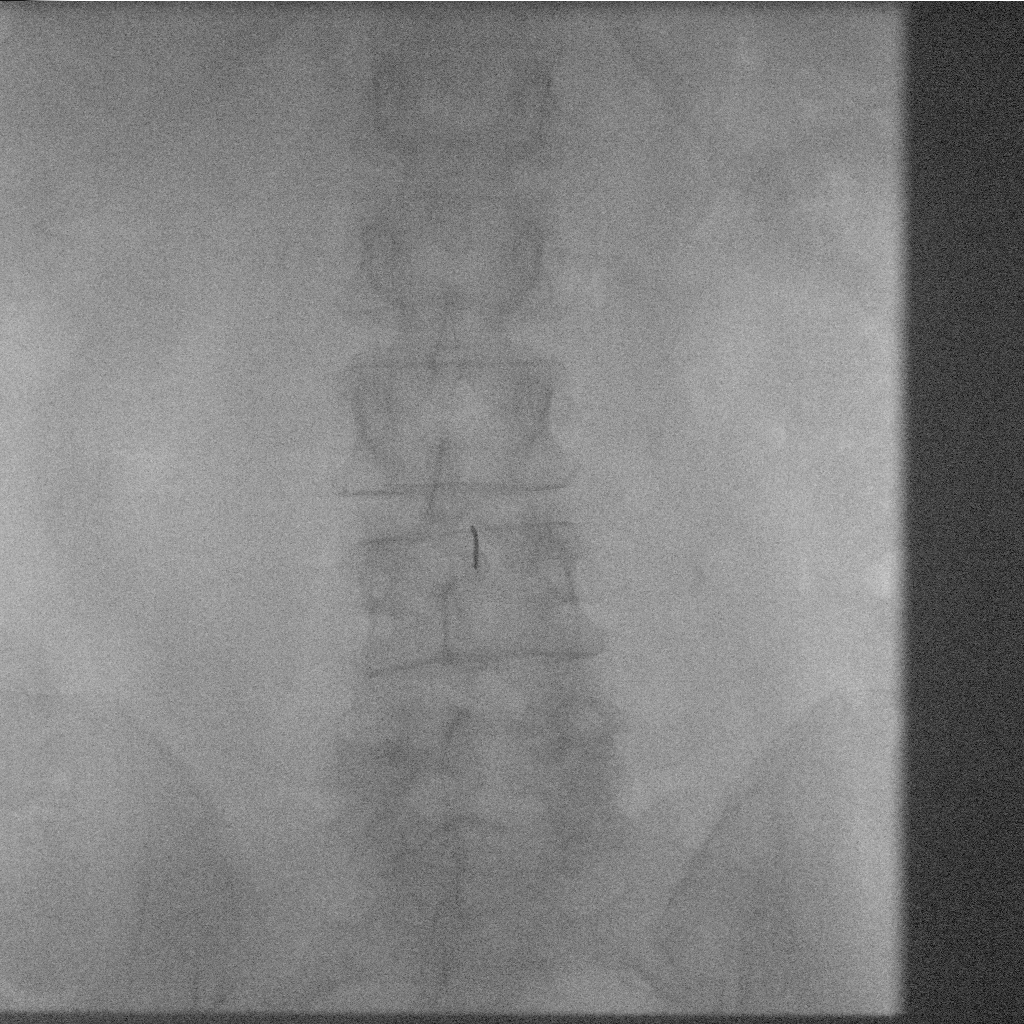

[1 of 1 positions shown; findings below may reference images not displayed]

EXAM:
DIAGNOSTIC LUMBAR PUNCTURE UNDER FLUOROSCOPIC GUIDANCE

FLUOROSCOPY TIME:  Fluoroscopy Time (in minutes and seconds): 0
minutes, 30 seconds

Number of Acquired Images:  1

PROCEDURE:
Informed consent was obtained from the patient prior to the
procedure, including potential complications of headache, allergy,
and pain. With the patient prone, the lower back was prepped with
Betadine. 1% Lidocaine was used for local anesthesia. Lumbar
puncture was performed at the L3-4 level using a 20 gauge needle
with return of clear CSF with an opening pressure of 16 cm water.
Eight ml of CSF were obtained for laboratory studies. The patient
tolerated the procedure well and there were no apparent
complications.
IMPRESSION: Successful fluoroscopically guided lumbar puncture. The patient
tolerated the procedure well and was sent to [REDACTED] for
postprocedure observation.
